# Patient Record
Sex: Female | Born: 1995 | Race: Black or African American | Hispanic: No | Marital: Single | Smoking: Never smoker
Health system: Southern US, Community
[De-identification: ages and names within clinical notes are randomized; demographics above are authoritative.]

## PROBLEM LIST (undated history)

## (undated) DIAGNOSIS — R519 Headache, unspecified: Secondary | ICD-10-CM

## (undated) DIAGNOSIS — F32A Depression, unspecified: Secondary | ICD-10-CM

## (undated) DIAGNOSIS — F419 Anxiety disorder, unspecified: Secondary | ICD-10-CM

## (undated) DIAGNOSIS — R7303 Prediabetes: Secondary | ICD-10-CM

## (undated) DIAGNOSIS — D649 Anemia, unspecified: Secondary | ICD-10-CM

## (undated) DIAGNOSIS — K219 Gastro-esophageal reflux disease without esophagitis: Secondary | ICD-10-CM

## (undated) DIAGNOSIS — N83209 Unspecified ovarian cyst, unspecified side: Secondary | ICD-10-CM

## (undated) DIAGNOSIS — Z8759 Personal history of other complications of pregnancy, childbirth and the puerperium: Secondary | ICD-10-CM

## (undated) HISTORY — PX: APPENDECTOMY: SHX54

---

## 2013-01-11 ENCOUNTER — Encounter (HOSPITAL_BASED_OUTPATIENT_CLINIC_OR_DEPARTMENT_OTHER): Payer: Self-pay | Admitting: Emergency Medicine

## 2013-01-11 DIAGNOSIS — Z8719 Personal history of other diseases of the digestive system: Secondary | ICD-10-CM | POA: Insufficient documentation

## 2013-01-11 DIAGNOSIS — Z3202 Encounter for pregnancy test, result negative: Secondary | ICD-10-CM | POA: Insufficient documentation

## 2013-01-11 DIAGNOSIS — N938 Other specified abnormal uterine and vaginal bleeding: Secondary | ICD-10-CM | POA: Insufficient documentation

## 2013-01-11 DIAGNOSIS — N949 Unspecified condition associated with female genital organs and menstrual cycle: Secondary | ICD-10-CM | POA: Insufficient documentation

## 2013-01-11 LAB — URINE MICROSCOPIC-ADD ON

## 2013-01-11 LAB — URINALYSIS, ROUTINE W REFLEX MICROSCOPIC
Nitrite: NEGATIVE
Protein, ur: NEGATIVE mg/dL
Urobilinogen, UA: 1 mg/dL (ref 0.0–1.0)

## 2013-01-11 LAB — PREGNANCY, URINE: Preg Test, Ur: NEGATIVE

## 2013-01-11 NOTE — ED Notes (Addendum)
C/o heavy vaginal period-2 period in Dec-c/o abd cramps-was seen at [facility] for r/o appy 2 weeks ago-rx vicodin-ran out-also rx abx she is still taking-rx phenergan po

## 2013-01-12 ENCOUNTER — Emergency Department (HOSPITAL_BASED_OUTPATIENT_CLINIC_OR_DEPARTMENT_OTHER)
Admission: EM | Admit: 2013-01-12 | Discharge: 2013-01-12 | Disposition: A | Discharge status: Home / Self Care | Payer: Medicaid Other | Attending: Emergency Medicine | Admitting: Emergency Medicine

## 2013-01-12 DIAGNOSIS — N939 Abnormal uterine and vaginal bleeding, unspecified: Secondary | ICD-10-CM

## 2013-01-12 HISTORY — DX: Unspecified ovarian cyst, unspecified side: N83.209

## 2013-01-12 HISTORY — DX: Gastro-esophageal reflux disease without esophagitis: K21.9

## 2013-01-12 LAB — GC/CHLAMYDIA PROBE AMP
CT Probe RNA: NEGATIVE
GC Probe RNA: NEGATIVE

## 2013-01-12 LAB — CBC WITH DIFFERENTIAL/PLATELET
Basophils Absolute: 0 10*3/uL (ref 0.0–0.1)
Eosinophils Relative: 3 % (ref 0–5)
HCT: 35.2 % — ABNORMAL LOW (ref 36.0–46.0)
Hemoglobin: 11.6 g/dL — ABNORMAL LOW (ref 12.0–15.0)
Lymphocytes Relative: 23 % (ref 12–46)
Lymphs Abs: 2.6 10*3/uL (ref 0.7–4.0)
MCV: 90.7 fL (ref 78.0–100.0)
Monocytes Absolute: 1 10*3/uL (ref 0.1–1.0)
Monocytes Relative: 9 % (ref 3–12)
Platelets: 331 10*3/uL (ref 150–400)
RDW: 13.5 % (ref 11.5–15.5)
WBC: 11.5 10*3/uL — ABNORMAL HIGH (ref 4.0–10.5)

## 2013-01-12 LAB — WET PREP, GENITAL

## 2013-01-12 NOTE — ED Notes (Signed)
Vag bleeding onset yesterday w abdpain lmp beginning december   l

## 2013-01-12 NOTE — ED Provider Notes (Signed)
Medical screening examination/treatment/procedure(s) were performed by non-physician practitioner and as supervising physician I was immediately available for consultation/collaboration.  EKG Interpretation   None         Dawnya Grams L Cyara Devoto, MD 01/12/13 0411 

## 2013-01-12 NOTE — ED Provider Notes (Signed)
CSN: [id number]     Arrival date & time 01/11/13  2113 History   First MD Initiated Contact with Patient 01/12/13 0006     Chief Complaint  Patient presents with  . Vaginal Bleeding   (Consider location/radiation/quality/duration/timing/severity/associated sxs/prior Treatment) Patient is a 17 y.o. female presenting with vaginal bleeding. The history is provided by the patient. No language interpreter was used.  Vaginal Bleeding Quality:  Bright red Severity:  Moderate Onset quality:  Sudden Duration:  1 day Timing:  Constant Progression:  Worsening Chronicity:  New Possible pregnancy: no   Relieved by:  Nothing Associated symptoms: no abdominal pain   Risk factors: no STD exposure   Pt reports she has been on antibiotic.   Pt reports she was diagnosed with an ovarian cyst early this month.  Pt had a ct scan at [place] to weeks ago.  Pt reports appendix was normal.  Pt reports she started period today.  Pt reports heavier than normal period.    Past Medical History  Diagnosis Date  . Ovarian cyst   . GERD (gastroesophageal reflux disease)    History reviewed. No pertinent past surgical history. No family history on file. History  Substance Use Topics  . Smoking status: Never Smoker   . Smokeless tobacco: Not on file  . Alcohol Use: No   OB History   Grav Para Term Preterm Abortions TAB SAB Ect Mult Living                 Review of Systems  Gastrointestinal: Negative for abdominal pain.  Genitourinary: Positive for vaginal bleeding.  All other systems reviewed and are negative.    Allergies  Review of patient's allergies indicates no known allergies.  Home Medications   Current Outpatient Rx  Name  Route  Sig  Dispense  Refill  . promethazine (PHENERGAN) 25 MG tablet   Oral   Take 25 mg by mouth every 6 (six) hours as needed for nausea or vomiting.         Marland Kitchen UNKNOWN TO PATIENT      abx          BP 118/57  Pulse 84  Temp(Src) 98.3 F (36.8 C)  (Oral)  Resp 16  Ht 5\' 1"  (1.549 m)  Wt 200 lb (90.719 kg)  BMI 37.81 kg/m2  SpO2 100%  LMP 01/10/2013 Physical Exam  Nursing note and vitals reviewed. Constitutional: She is oriented to person, place, and time. She appears well-developed and well-nourished.  HENT:  Head: Normocephalic.  Right Ear: External ear normal.  Left Ear: External ear normal.  Eyes: Conjunctivae and EOM are normal. Pupils are equal, round, and reactive to light.  Neck: Normal range of motion.  Cardiovascular: Normal rate, regular rhythm and normal heart sounds.   Pulmonary/Chest: Effort normal and breath sounds normal.  Abdominal: Soft. She exhibits no distension.  Genitourinary: Vagina normal and uterus normal.  Moderate bleeding no clots   Musculoskeletal: Normal range of motion.  Neurological: She is alert and oriented to person, place, and time. She has normal reflexes.  Skin: Skin is warm.  Psychiatric: She has a normal mood and affect.    ED Course  Procedures (including critical care time) Labs Review Labs Reviewed  URINALYSIS, ROUTINE W REFLEX MICROSCOPIC - Abnormal; Notable for the following:    Hgb urine dipstick LARGE (*)    Ketones, ur 15 (*)    All other components within normal limits  PREGNANCY, URINE  URINE MICROSCOPIC-ADD ON  Imaging Review No results found.  EKG Interpretation   None     hemoglobin 11.6  MDM   1. Abnormal vaginal bleeding        Elson Areas, [place] 01/12/13 [phone]

## 2013-02-11 ENCOUNTER — Emergency Department (HOSPITAL_BASED_OUTPATIENT_CLINIC_OR_DEPARTMENT_OTHER): Payer: Medicaid Other

## 2013-02-11 ENCOUNTER — Emergency Department (HOSPITAL_BASED_OUTPATIENT_CLINIC_OR_DEPARTMENT_OTHER)
Admission: EM | Admit: 2013-02-11 | Discharge: 2013-02-11 | Disposition: A | Discharge status: Home / Self Care | Payer: Medicaid Other | Attending: Emergency Medicine | Admitting: Emergency Medicine

## 2013-02-11 ENCOUNTER — Encounter (HOSPITAL_BASED_OUTPATIENT_CLINIC_OR_DEPARTMENT_OTHER): Payer: Self-pay | Admitting: Emergency Medicine

## 2013-02-11 DIAGNOSIS — Z791 Long term (current) use of non-steroidal anti-inflammatories (NSAID): Secondary | ICD-10-CM | POA: Insufficient documentation

## 2013-02-11 DIAGNOSIS — Z79899 Other long term (current) drug therapy: Secondary | ICD-10-CM | POA: Insufficient documentation

## 2013-02-11 DIAGNOSIS — Z3202 Encounter for pregnancy test, result negative: Secondary | ICD-10-CM | POA: Insufficient documentation

## 2013-02-11 DIAGNOSIS — N83209 Unspecified ovarian cyst, unspecified side: Secondary | ICD-10-CM | POA: Insufficient documentation

## 2013-02-11 DIAGNOSIS — A499 Bacterial infection, unspecified: Secondary | ICD-10-CM | POA: Insufficient documentation

## 2013-02-11 DIAGNOSIS — B9689 Other specified bacterial agents as the cause of diseases classified elsewhere: Secondary | ICD-10-CM | POA: Insufficient documentation

## 2013-02-11 DIAGNOSIS — N760 Acute vaginitis: Secondary | ICD-10-CM | POA: Insufficient documentation

## 2013-02-11 DIAGNOSIS — K219 Gastro-esophageal reflux disease without esophagitis: Secondary | ICD-10-CM | POA: Insufficient documentation

## 2013-02-11 LAB — COMPREHENSIVE METABOLIC PANEL
ALT: 10 U/L (ref 0–35)
AST: 14 U/L (ref 0–37)
Albumin: 3.5 g/dL (ref 3.5–5.2)
Alkaline Phosphatase: 74 U/L (ref 39–117)
BUN: 12 mg/dL (ref 6–23)
CO2: 25 mEq/L (ref 19–32)
Calcium: 9 mg/dL (ref 8.4–10.5)
Chloride: 103 mEq/L (ref 96–112)
Creatinine, Ser: 0.7 mg/dL (ref 0.50–1.10)
GFR calc Af Amer: >90 mL/min (ref >90)
GFR calc non Af Amer: >90 mL/min (ref >90)
Glucose, Bld: 98 mg/dL (ref 70–99)
Potassium: 4.1 mEq/L (ref 3.7–5.3)
Sodium: 139 mEq/L (ref 137–147)
Total Bilirubin: 0.3 mg/dL (ref 0.3–1.2)
Total Protein: 7.4 g/dL (ref 6.0–8.3)

## 2013-02-11 LAB — CBC WITH DIFFERENTIAL/PLATELET
Basophils Absolute: 0 10*3/uL (ref 0.0–0.1)
Basophils Relative: 0 % (ref 0–1)
Eosinophils Absolute: 0.3 10*3/uL (ref 0.0–0.7)
Eosinophils Relative: 3 % (ref 0–5)
HCT: 35.4 % — ABNORMAL LOW (ref 36.0–46.0)
Hemoglobin: 11.7 g/dL — ABNORMAL LOW (ref 12.0–15.0)
Lymphocytes Relative: 17 % (ref 12–46)
Lymphs Abs: 1.7 10*3/uL (ref 0.7–4.0)
MCH: 30 pg (ref 26.0–34.0)
MCHC: 33.1 g/dL (ref 30.0–36.0)
MCV: 90.8 fL (ref 78.0–100.0)
Monocytes Absolute: 0.8 10*3/uL (ref 0.1–1.0)
Monocytes Relative: 8 % (ref 3–12)
Neutro Abs: 7.4 10*3/uL (ref 1.7–7.7)
Neutrophils Relative %: 72 % (ref 43–77)
Platelets: 308 10*3/uL (ref 150–400)
RBC: 3.9 MIL/uL (ref 3.87–5.11)
RDW: 13.7 % (ref 11.5–15.5)
WBC: 10.3 10*3/uL (ref 4.0–10.5)

## 2013-02-11 LAB — PREGNANCY, URINE: Preg Test, Ur: NEGATIVE

## 2013-02-11 LAB — URINALYSIS, ROUTINE W REFLEX MICROSCOPIC
Bilirubin Urine: NEGATIVE
GLUCOSE, UA: NEGATIVE mg/dL
HGB URINE DIPSTICK: NEGATIVE
Ketones, ur: NEGATIVE mg/dL
Leukocytes, UA: NEGATIVE
Nitrite: NEGATIVE
Protein, ur: NEGATIVE mg/dL
SPECIFIC GRAVITY, URINE: 1.025 (ref 1.005–1.030)
Urobilinogen, UA: 0.2 mg/dL (ref 0.0–1.0)
pH: 7 (ref 5.0–8.0)

## 2013-02-11 LAB — WET PREP, GENITAL
Trich, Wet Prep: NONE SEEN
Yeast Wet Prep HPF POC: NONE SEEN

## 2013-02-11 LAB — LIPASE, BLOOD: Lipase: 17 U/L (ref 11–59)

## 2013-02-11 MED ORDER — IBUPROFEN 800 MG PO TABS: 800.0000 mg | ORAL_TABLET | Freq: Three times a day (TID) | ORAL | Status: DC

## 2013-02-11 MED ORDER — METRONIDAZOLE 500 MG PO TABS: 500.0000 mg | ORAL_TABLET | Freq: Two times a day (BID) | ORAL | Status: DC

## 2013-02-11 MED ORDER — IOHEXOL 300 MG/ML  SOLN
50.0000 mL | Freq: Once | INTRAMUSCULAR | Status: AC | PRN
  Administered 2013-02-11: 50 mL via ORAL

## 2013-02-11 MED ORDER — ONDANSETRON 4 MG PO TBDP
4.0000 mg | ORAL_TABLET | Freq: Once | ORAL | Status: AC
  Administered 2013-02-11: 4 mg via ORAL
  Filled 2013-02-11: qty 1

## 2013-02-11 MED ORDER — IOHEXOL 300 MG/ML  SOLN
100.0000 mL | Freq: Once | INTRAMUSCULAR | Status: AC | PRN
  Administered 2013-02-11: 100 mL via INTRAVENOUS

## 2013-02-11 MED ORDER — HYDROCODONE-ACETAMINOPHEN 5-325 MG PO TABS: 2.0000 | ORAL_TABLET | ORAL | Status: DC | PRN

## 2013-02-11 NOTE — Discharge Instructions (Signed)

## 2013-02-11 NOTE — ED Notes (Signed)
Patient transported to CT 

## 2013-02-11 NOTE — ED Provider Notes (Signed)
CSN: [id number]     Arrival date & time 02/11/13  0906 History   First MD Initiated Contact with Patient 02/11/13 [phone]     Chief Complaint  Patient presents with  . Nausea  . Diarrhea   (Consider location/radiation/quality/duration/timing/severity/associated sxs/prior Treatment) HPI Comments: Nausea and crampy lower abdominal pain for the past 2 days. Associated with loose episodes of diarrhea. Denies any vomiting. Denies any fever. Has been evaluated for similar pain in the past with CT scan and ultrasound and told it was not her gallbladder. Denies any fevers. Denies any dysuria hematuria. Denies any vaginal bleeding or discharge.  The history is provided by the patient.    Past Medical History  Diagnosis Date  . Ovarian cyst   . GERD (gastroesophageal reflux disease)    History reviewed. No pertinent past surgical history. No family history on file. History  Substance Use Topics  . Smoking status: Never Smoker   . Smokeless tobacco: Not on file  . Alcohol Use: No   OB History   Grav Para Term Preterm Abortions TAB SAB Ect Mult Living                 Review of Systems  Constitutional: Negative for fever, activity change and appetite change.  HENT: Negative for congestion and rhinorrhea.   Respiratory: Negative for cough, chest tightness and shortness of breath.   Gastrointestinal: Positive for nausea, abdominal pain and diarrhea. Negative for vomiting.  Genitourinary: Negative for dysuria, hematuria, vaginal bleeding and vaginal discharge.  Musculoskeletal: Positive for arthralgias and myalgias.  Skin: Negative for rash.  Neurological: Negative for dizziness, weakness and headaches.  A complete 10 system review of systems was obtained and all systems are negative except as noted in the HPI and PMH.    Allergies  Review of patient's allergies indicates no known allergies.  Home Medications   Current Outpatient Rx  Name  Route  Sig  Dispense  Refill  . omeprazole  (PRILOSEC) 10 MG capsule   Oral   Take 10 mg by mouth daily.         Marland Kitchen HYDROcodone-acetaminophen (NORCO/VICODIN) 5-325 MG per tablet   Oral   Take 2 tablets by mouth every 4 (four) hours as needed.   10 tablet   0   . ibuprofen (ADVIL,MOTRIN) 800 MG tablet   Oral   Take 1 tablet (800 mg total) by mouth 3 (three) times daily.   21 tablet   0   . metroNIDAZOLE (FLAGYL) 500 MG tablet   Oral   Take 1 tablet (500 mg total) by mouth 2 (two) times daily.   14 tablet   0   . promethazine (PHENERGAN) 25 MG tablet   Oral   Take 25 mg by mouth every 6 (six) hours as needed for nausea or vomiting.         Marland Kitchen UNKNOWN TO PATIENT      abx          BP 105/60  Pulse 68  Temp(Src) 98.5 F (36.9 C) (Oral)  Resp 18  Ht 5' (1.524 m)  Wt 200 lb (90.719 kg)  BMI 39.06 kg/m2  SpO2 100%  LMP 01/14/2013 Physical Exam  Constitutional: She is oriented to person, place, and time. She appears well-developed and well-nourished. No distress.  HENT:  Head: Normocephalic and atraumatic.  Mouth/Throat: Oropharynx is clear and moist. No oropharyngeal exudate.  Eyes: Conjunctivae and EOM are normal. Pupils are equal, round, and reactive to light.  Neck: Normal  range of motion. Neck supple.  Cardiovascular: Normal rate, regular rhythm and normal heart sounds.   Pulmonary/Chest: Effort normal and breath sounds normal. No respiratory distress.  Abdominal: Soft. There is tenderness. There is no rebound and no guarding.  Mild R sided tenderness.  Negative Murphy's sign  Genitourinary:  Normal external genitalia.  No CMT.  R adnexal fullness  Musculoskeletal: Normal range of motion. She exhibits no edema and no tenderness.  No CVAT  Neurological: She is alert and oriented to person, place, and time. No cranial nerve deficit. She exhibits normal muscle tone. Coordination normal.  Skin: Skin is warm.    ED Course  Procedures (including critical care time) Labs Review Labs Reviewed  WET PREP,  GENITAL - Abnormal; Notable for the following:    Clue Cells Wet Prep HPF POC FEW (*)    WBC, Wet Prep HPF POC FEW (*)    All other components within normal limits  URINALYSIS, ROUTINE W REFLEX MICROSCOPIC - Abnormal; Notable for the following:    APPearance CLOUDY (*)    All other components within normal limits  CBC WITH DIFFERENTIAL - Abnormal; Notable for the following:    Hemoglobin 11.7 (*)    HCT 35.4 (*)    All other components within normal limits  GC/CHLAMYDIA PROBE AMP  PREGNANCY, URINE  COMPREHENSIVE METABOLIC PANEL  LIPASE, BLOOD   Imaging Review US Transvaginal Non-ob  02/11/2013   CLINICAL DATA:  Right lower quadrant pain  EXAM: TRANSABDOMINAL AND TRANSVAGINAL ULTRASOUND OF PELVIS  DOPPLER ULTRASOUND OF OVARIES  TECHNIQUE: Both transabdominal and transvaginal ultrasound examinations of the pelvis were performed. Transabdominal technique was performed for global imaging of the pelvis including uterus, ovaries, adnexal regions, and pelvic cul-de-sac.  It was necessary to proceed with endovaginal exam following the transabdominal exam to visualize the ovaries and endometrium. Color and duplex Doppler ultrasound was utilized to evaluate blood flow to the ovaries.  COMPARISON:  CT ABD/PELVIS W CM dated 02/11/2013  FINDINGS: Uterus  Measurements: 7.2 x 3.7 x 5.1 cm. No fibroids or other mass visualized.  Endometrium  Thickness: 4 mm.  No focal abnormality visualized.  Right ovary  Measurements: 4.7 x 3.9 x 4.3 cm. There is a 4.2 x 3.1 x 3.4 cm anechoic right ovarian mass with increased through transmission most consistent with cyst.  Left ovary  Measurements: 2.9 x 1.4 x 2.2 cm. Normal appearance/no adnexal mass.  Pulsed Doppler evaluation of both ovaries demonstrates normal low-resistance arterial and venous waveforms.  Other findings  No free fluid.  IMPRESSION: 1. No active ovarian torsion. 2. There is a 4.2 x 3.1 x 3.4 cm right ovarian cyst. This is almost certainly benign, and no  specific imaging follow up is recommended according to the Society of Radiologists in Ultrasound 2010 Consensus Conference Statement (D Lenis Noon et al. Management of Asymptomatic Ovarian and Other Adnexal Cysts Imaged at [place]: Society of Radiologists in Ultrasound Consensus Conference Statement 2010. Radiology 256 (Sept 2010): 943-954.).   Electronically Signed   By: Elige Ko   On: 02/11/2013 14:06   US Pelvis Complete  02/11/2013   CLINICAL DATA:  Right lower quadrant pain  EXAM: TRANSABDOMINAL AND TRANSVAGINAL ULTRASOUND OF PELVIS  DOPPLER ULTRASOUND OF OVARIES  TECHNIQUE: Both transabdominal and transvaginal ultrasound examinations of the pelvis were performed. Transabdominal technique was performed for global imaging of the pelvis including uterus, ovaries, adnexal regions, and pelvic cul-de-sac.  It was necessary to proceed with endovaginal exam following the transabdominal exam to visualize the  ovaries and endometrium. Color and duplex Doppler ultrasound was utilized to evaluate blood flow to the ovaries.  COMPARISON:  CT ABD/PELVIS W CM dated 02/11/2013  FINDINGS: Uterus  Measurements: 7.2 x 3.7 x 5.1 cm. No fibroids or other mass visualized.  Endometrium  Thickness: 4 mm.  No focal abnormality visualized.  Right ovary  Measurements: 4.7 x 3.9 x 4.3 cm. There is a 4.2 x 3.1 x 3.4 cm anechoic right ovarian mass with increased through transmission most consistent with cyst.  Left ovary  Measurements: 2.9 x 1.4 x 2.2 cm. Normal appearance/no adnexal mass.  Pulsed Doppler evaluation of both ovaries demonstrates normal low-resistance arterial and venous waveforms.  Other findings  No free fluid.  IMPRESSION: 1. No active ovarian torsion. 2. There is a 4.2 x 3.1 x 3.4 cm right ovarian cyst. This is almost certainly benign, and no specific imaging follow up is recommended according to the Society of Radiologists in Ultrasound 2010 Consensus Conference Statement (D Lenis NoonLevine et al. Management of Asymptomatic Ovarian  and Other Adnexal Cysts Imaged at [place]: Society of Radiologists in Ultrasound Consensus Conference Statement 2010. Radiology 256 (Sept 2010): 943-954.).   Electronically Signed   By: Elige KoHetal  Patel   On: 02/11/2013 14:06   Ct Abdomen Pelvis W Contrast  02/11/2013   CLINICAL DATA:  Right-sided abdominal discomfort with nausea and vomiting and diarrhea for 3 days  EXAM: CT ABDOMEN AND PELVIS WITH CONTRAST  TECHNIQUE: Multidetector CT imaging of the abdomen and pelvis was performed using the standard protocol following bolus administration of intravenous contrast.  CONTRAST:  100mL OMNIPAQUE IOHEXOL 300 MG/ML SOLN intravenously, the patient also received oral contrast material.  COMPARISON:  CT ABD-PELV W/ CM dated 01/01/2013  FINDINGS: Since the previous study there has developed a prominent right adnexal cystic appearing structure measuring 3.3 x 4.6 cm with HU measurement of +25. A normal appearing appendix lies adjacent to this on images 58 through 69. The uterus is normal in density and contour. The previously demonstrated cystic left adnexal structure has resolved. There is no free fluid in the pelvis.  The liver, gallbladder, pancreas, spleen, partially distended stomach, adrenal glands, and kidneys are normal in appearance. The caliber of the abdominal aorta is normal. There is no periaortic or pericaval lymphadenopathy. The psoas musculature is normal in appearance. Contrast within the small bowel appears normal. It has not reached the colon. There is a moderate stool burden noted throughout the colon and rectum.  The lung bases are clear. The lumbar vertebral bodies are preserved in height. The bony pelvis exhibits no acute abnormality.  IMPRESSION: 1. Since the previous study there has developed a cystic-appearing right adnexal process. This may be responsible for the patient's symptoms. This structure could be evaluated further with pelvic ultrasound in 10 days to 2 weeks to judge whether it might resolve  with progression of the patient's menstrual cycle. The previously described left adnexal process has resolved. 2. There is no evidence of acute appendicitis nor other acute bowel abnormality. A moderate amount of stool is noted throughout the colon. 3. There is no acute hepatobiliary abnormality nor acute urinary tract abnormality.   Electronically Signed   By: David  Swaz[place]   On: 02/11/2013 12:41   [place] Art/ven Flow Abd Pelv Doppler  02/11/2013   CLINICAL DATA:  Right lower quadrant pain  EXAM: TRANSABDOMINAL AND TRANSVAGINAL ULTRASOUND OF PELVIS  DOPPLER ULTRASOUND OF OVARIES  TECHNIQUE: Both transabdominal and transvaginal ultrasound examinations of the pelvis were performed. Transabdominal technique  was performed for global imaging of the pelvis including uterus, ovaries, adnexal regions, and pelvic cul-de-sac.  It was necessary to proceed with endovaginal exam following the transabdominal exam to visualize the ovaries and endometrium. Color and duplex Doppler ultrasound was utilized to evaluate blood flow to the ovaries.  COMPARISON:  CT ABD/PELVIS W CM dated 02/11/2013  FINDINGS: Uterus  Measurements: 7.2 x 3.7 x 5.1 cm. No fibroids or other mass visualized.  Endometrium  Thickness: 4 mm.  No focal abnormality visualized.  Right ovary  Measurements: 4.7 x 3.9 x 4.3 cm. There is a 4.2 x 3.1 x 3.4 cm anechoic right ovarian mass with increased through transmission most consistent with cyst.  Left ovary  Measurements: 2.9 x 1.4 x 2.2 cm. Normal appearance/no adnexal mass.  Pulsed Doppler evaluation of both ovaries demonstrates normal low-resistance arterial and venous waveforms.  Other findings  No free fluid.  IMPRESSION: 1. No active ovarian torsion. 2. There is a 4.2 x 3.1 x 3.4 cm right ovarian cyst. This is almost certainly benign, and no specific imaging follow up is recommended according to the Society of Radiologists in Ultrasound 2010 Consensus Conference Statement (D Lenis Noon et al. Management of  Asymptomatic Ovarian and Other Adnexal Cysts Imaged at [place]: Society of Radiologists in Ultrasound Consensus Conference Statement 2010. Radiology 256 (Sept 2010): 943-954.).   Electronically Signed   By: Elige Ko   On: 02/11/2013 14:06    EKG Interpretation   None       MDM   1. Ovarian cyst   2. Bacterial vaginosis    R sided abdominal pain with nausea and diarrhea. She was told she had an ovarian cyst on the left. She now has pain in the right. She does still have an appendix.  Labs are unremarkable. Urinalysis is negative.  Records obtained from [facility]. CT scan on December 19 showed normal appendix with large left adnexal lesion thought to be ovarian cysts. ultrasound of the abdomen showed a normal gallbladder.  Imaging today shows large right-sided adnexal cyst. Appendix appears normal. No evidence of ovarian torsion. Patient's pain will be treated with anti-inflammatories. We'll treat bacterial vaginosis. Follow up with gynecology  Glynn Octave, MD 02/11/13 [phone]

## 2013-02-11 NOTE — ED Notes (Signed)
Returned from ct scan 

## 2013-02-11 NOTE — ED Notes (Signed)
Patient states she has had nausea and diarrhea for the last two days.  States she does have LRQ abdominal pain and was referred to an OBGYN for further evaluation.

## 2013-02-11 NOTE — ED Notes (Signed)
Pelvic cart at bedside. 

## 2013-02-11 NOTE — ED Notes (Signed)
Drinking contrast for ct 

## 2013-02-12 LAB — GC/CHLAMYDIA PROBE AMP
CT Probe RNA: NEGATIVE
GC Probe RNA: NEGATIVE

## 2013-05-17 ENCOUNTER — Emergency Department (HOSPITAL_BASED_OUTPATIENT_CLINIC_OR_DEPARTMENT_OTHER): Payer: Medicaid Other

## 2013-05-17 ENCOUNTER — Emergency Department (HOSPITAL_BASED_OUTPATIENT_CLINIC_OR_DEPARTMENT_OTHER)
Admission: EM | Admit: 2013-05-17 | Discharge: 2013-05-17 | Disposition: A | Discharge status: Home / Self Care | Payer: Medicaid Other | Attending: Emergency Medicine | Admitting: Emergency Medicine

## 2013-05-17 ENCOUNTER — Encounter (HOSPITAL_BASED_OUTPATIENT_CLINIC_OR_DEPARTMENT_OTHER): Payer: Self-pay | Admitting: Emergency Medicine

## 2013-05-17 DIAGNOSIS — R109 Unspecified abdominal pain: Secondary | ICD-10-CM

## 2013-05-17 DIAGNOSIS — K219 Gastro-esophageal reflux disease without esophagitis: Secondary | ICD-10-CM | POA: Insufficient documentation

## 2013-05-17 DIAGNOSIS — Z792 Long term (current) use of antibiotics: Secondary | ICD-10-CM | POA: Insufficient documentation

## 2013-05-17 DIAGNOSIS — Z8742 Personal history of other diseases of the female genital tract: Secondary | ICD-10-CM | POA: Insufficient documentation

## 2013-05-17 DIAGNOSIS — Z79899 Other long term (current) drug therapy: Secondary | ICD-10-CM | POA: Insufficient documentation

## 2013-05-17 DIAGNOSIS — Z3202 Encounter for pregnancy test, result negative: Secondary | ICD-10-CM | POA: Insufficient documentation

## 2013-05-17 DIAGNOSIS — R51 Headache: Secondary | ICD-10-CM | POA: Insufficient documentation

## 2013-05-17 DIAGNOSIS — R1032 Left lower quadrant pain: Secondary | ICD-10-CM | POA: Insufficient documentation

## 2013-05-17 DIAGNOSIS — Z791 Long term (current) use of non-steroidal anti-inflammatories (NSAID): Secondary | ICD-10-CM | POA: Insufficient documentation

## 2013-05-17 LAB — URINALYSIS, ROUTINE W REFLEX MICROSCOPIC
Bilirubin Urine: NEGATIVE
Glucose, UA: NEGATIVE mg/dL
Hgb urine dipstick: NEGATIVE
KETONES UR: NEGATIVE mg/dL
Leukocytes, UA: NEGATIVE
NITRITE: NEGATIVE
PH: 6 (ref 5.0–8.0)
Protein, ur: NEGATIVE mg/dL
SPECIFIC GRAVITY, URINE: 1.022 (ref 1.005–1.030)
Urobilinogen, UA: 1 mg/dL (ref 0.0–1.0)

## 2013-05-17 LAB — CBC WITH DIFFERENTIAL/PLATELET
BASOS PCT: 0 % (ref 0–1)
Basophils Absolute: 0 10*3/uL (ref 0.0–0.1)
EOS ABS: 0.6 10*3/uL (ref 0.0–0.7)
Eosinophils Relative: 5 % (ref 0–5)
HCT: 35.5 % — ABNORMAL LOW (ref 36.0–46.0)
Hemoglobin: 11.9 g/dL — ABNORMAL LOW (ref 12.0–15.0)
LYMPHS ABS: 2.5 10*3/uL (ref 0.7–4.0)
Lymphocytes Relative: 20 % (ref 12–46)
MCH: 30.6 pg (ref 26.0–34.0)
MCHC: 33.5 g/dL (ref 30.0–36.0)
MCV: 91.3 fL (ref 78.0–100.0)
Monocytes Absolute: 1 10*3/uL (ref 0.1–1.0)
Monocytes Relative: 8 % (ref 3–12)
NEUTROS PCT: 67 % (ref 43–77)
Neutro Abs: 8.3 10*3/uL — ABNORMAL HIGH (ref 1.7–7.7)
PLATELETS: 339 10*3/uL (ref 150–400)
RBC: 3.89 MIL/uL (ref 3.87–5.11)
RDW: 13.5 % (ref 11.5–15.5)
WBC: 12.5 10*3/uL — ABNORMAL HIGH (ref 4.0–10.5)

## 2013-05-17 LAB — BASIC METABOLIC PANEL
BUN: 6 mg/dL (ref 6–23)
CO2: 25 mEq/L (ref 19–32)
Calcium: 9.6 mg/dL (ref 8.4–10.5)
Chloride: 102 mEq/L (ref 96–112)
Creatinine, Ser: 0.7 mg/dL (ref 0.50–1.10)
GLUCOSE: 100 mg/dL — AB (ref 70–99)
POTASSIUM: 3.5 meq/L — AB (ref 3.7–5.3)
Sodium: 140 mEq/L (ref 137–147)

## 2013-05-17 LAB — PREGNANCY, URINE: Preg Test, Ur: NEGATIVE

## 2013-05-17 MED ORDER — SODIUM CHLORIDE 0.9 % IV BOLUS (SEPSIS)
250.0000 mL | Freq: Once | INTRAVENOUS | Status: AC
Start: 2013-05-17 — End: 2013-05-17
  Administered 2013-05-17: 250 mL via INTRAVENOUS

## 2013-05-17 MED ORDER — HYDROCODONE-ACETAMINOPHEN 5-325 MG PO TABS: 1.0000 | ORAL_TABLET | Freq: Four times a day (QID) | ORAL | Status: DC | PRN

## 2013-05-17 MED ORDER — HYDROMORPHONE HCL PF 1 MG/ML IJ SOLN
1.0000 mg | Freq: Once | INTRAMUSCULAR | Status: AC
  Administered 2013-05-17: 1 mg via INTRAVENOUS
  Filled 2013-05-17: qty 1

## 2013-05-17 MED ORDER — IOHEXOL 300 MG/ML  SOLN
50.0000 mL | Freq: Once | INTRAMUSCULAR | Status: AC | PRN
  Administered 2013-05-17: 50 mL via ORAL

## 2013-05-17 MED ORDER — ONDANSETRON HCL 4 MG/2ML IJ SOLN
4.0000 mg | Freq: Once | INTRAMUSCULAR | Status: AC
  Administered 2013-05-17: 4 mg via INTRAVENOUS
  Filled 2013-05-17: qty 2

## 2013-05-17 MED ORDER — IOHEXOL 300 MG/ML  SOLN
100.0000 mL | Freq: Once | INTRAMUSCULAR | Status: AC | PRN
  Administered 2013-05-17: 100 mL via INTRAVENOUS

## 2013-05-17 MED ORDER — SODIUM CHLORIDE 0.9 % IV SOLN
INTRAVENOUS | Status: DC
  Administered 2013-05-17: 23:00:00 via INTRAVENOUS

## 2013-05-17 MED ORDER — PROMETHAZINE HCL 25 MG PO TABS: 25.0000 mg | ORAL_TABLET | Freq: Four times a day (QID) | ORAL | Status: DC | PRN

## 2013-05-17 NOTE — ED Notes (Signed)
Pt reports that she developed lower left abdominal pain that is worse with ambulation last pm, the pain has persisted since last pm and pain shifts towards the middle. Denies vomiting or diarrhea, reports some nausea, last bm 5/3

## 2013-05-17 NOTE — ED Provider Notes (Signed)
CSN: [id number]     Arrival date & time 05/17/13  1908 History  This chart was scribed for Shelda Jakes, MD by Ardelia Mems, ED Scribe. This patient was seen in room MH07/MH07 and the patient's care was started at 9:43 PM.    Chief Complaint  Patient presents with  . Abdominal Pain    Patient is a 18 y.o. female presenting with abdominal pain. The history is provided by the patient. No language interpreter was used.  Abdominal Pain Pain location:  LLQ Pain quality: sharp   Pain radiates to:  Back Pain severity:  Moderate ("7/10" currently) Onset quality:  Gradual Duration:  24 hours Timing:  Constant Progression:  Waxing and waning Chronicity:  New Relieved by:  None tried Worsened by:  Movement Ineffective treatments:  None tried Associated symptoms: nausea and sore throat   Associated symptoms: no chest pain, no cough, no dysuria, no fever, no shortness of breath, no vaginal bleeding and no vomiting     HPI Comments: Miranda Mack is a 18 y.o. Female with a history of GERD and ovarian cyst who presents to the Emergency Department complaining of constant, moderate LLQ abdominal pain onset last night, around 8 or 9 PM. She describes her pain as "sharp" and rates her pain at "7/10" currently. She states that her pain occasionally radiates to her back. She reports associated nausea, but denies emesis. She also states that she has been having a headache and light headedness over the past few days, as well as a sore throat today. She states that her pain is worsened with walking. She states that she has a history of similar pain, which she was seen for in January 2015. At that visit, she was diagnosed with an ovarian cyst. She denies dysuria or any other symptoms. She states that her menstrual cycle is not regular, and that her LMP ended on 05/08/12.   Past Medical History  Diagnosis Date  . Ovarian cyst   . GERD (gastroesophageal reflux disease)    History reviewed. No pertinent  past surgical history. History reviewed. No pertinent family history. History  Substance Use Topics  . Smoking status: Never Smoker   . Smokeless tobacco: Not on file  . Alcohol Use: No   OB History   Grav Para Term Preterm Abortions TAB SAB Ect Mult Living                 Review of Systems  Constitutional: Negative for fever.  HENT: Positive for sore throat. Negative for rhinorrhea.   Eyes: Negative for visual disturbance.  Respiratory: Negative for cough, shortness of breath and wheezing.   Cardiovascular: Negative for chest pain and leg swelling.  Gastrointestinal: Positive for nausea and abdominal pain. Negative for vomiting.  Genitourinary: Negative for dysuria and vaginal bleeding.  Musculoskeletal: Positive for back pain.  Skin: Negative for rash.  Neurological: Positive for light-headedness and headaches.  Hematological: Does not bruise/bleed easily.  Psychiatric/Behavioral: Negative for confusion.    Allergies  Review of patient's allergies indicates no known allergies.  Home Medications   Prior to Admission medications   Medication Sig Start Date End Date Taking? Authorizing Provider  traMADol (ULTRAM) 50 MG tablet Take by mouth every 6 (six) hours as needed.   Yes Historical Provider, MD  HYDROcodone-acetaminophen (NORCO/VICODIN) 5-325 MG per tablet Take 2 tablets by mouth every 4 (four) hours as needed. 02/11/13   Glynn Octave, MD  ibuprofen (ADVIL,MOTRIN) 800 MG tablet Take 1 tablet (800 mg total) by  mouth 3 (three) times daily. 02/11/13   Glynn OctaveStephen Rancour, MD  metroNIDAZOLE (FLAGYL) 500 MG tablet Take 1 tablet (500 mg total) by mouth 2 (two) times daily. 02/11/13   Glynn OctaveStephen Rancour, MD  omeprazole (PRILOSEC) 10 MG capsule Take 10 mg by mouth daily.    Historical Provider, MD  promethazine (PHENERGAN) 25 MG tablet Take 25 mg by mouth every 6 (six) hours as needed for nausea or vomiting.    Historical Provider, MD  UNKNOWN TO PATIENT abx    Historical Provider, MD    Triage Vitals: BP 131/77  Pulse 70  Temp(Src) 98.3 F (36.8 C) (Oral)  Resp 22  Ht 5\' 1"  (1.549 m)  Wt 220 lb (99.791 kg)  BMI 41.59 kg/m2  SpO2 99%  LMP 05/09/2013  Physical Exam  Nursing note and vitals reviewed. Constitutional: She is oriented to person, place, and time. She appears well-developed and well-nourished. No distress.  HENT:  Head: Normocephalic and atraumatic.  Eyes: EOM are normal.  Neck: Neck supple. No tracheal deviation present.  Cardiovascular: Normal rate and regular rhythm.   Pulmonary/Chest: Effort normal and breath sounds normal. No respiratory distress. She has no wheezes. She has no rales.  Lungs CTA  Abdominal: Bowel sounds are normal. There is tenderness (LLQ).  Musculoskeletal: Normal range of motion. She exhibits no edema.  No swelling in the ankles  Neurological: She is alert and oriented to person, place, and time.  Skin: Skin is warm and dry.  Psychiatric: She has a normal mood and affect. Her behavior is normal.    ED Course  Procedures (including critical care time)  DIAGNOSTIC STUDIES: Oxygen Saturation is 99% on RA, normal by my interpretation.    COORDINATION OF CARE: 9:48 PM- Discussed plan to obtain UA and a urine pregnancy test. Pt advised of plan for treatment and pt agrees.  Labs Review Labs Reviewed  CBC WITH DIFFERENTIAL - Abnormal; Notable for the following:    WBC 12.5 (*)    Hemoglobin 11.9 (*)    HCT 35.5 (*)    Neutro Abs 8.3 (*)    All other components within normal limits  BASIC METABOLIC PANEL - Abnormal; Notable for the following:    Potassium 3.5 (*)    Glucose, Bld 100 (*)    All other components within normal limits  URINALYSIS, ROUTINE W REFLEX MICROSCOPIC  PREGNANCY, URINE   Results for orders placed during the hospital encounter of 05/17/13  URINALYSIS, ROUTINE W REFLEX MICROSCOPIC      Result Value Ref Range   Color, Urine YELLOW  YELLOW   APPearance CLEAR  CLEAR   Specific Gravity, Urine 1.022   1.005 - 1.030   pH 6.0  5.0 - 8.0   Glucose, UA NEGATIVE  NEGATIVE mg/dL   Hgb urine dipstick NEGATIVE  NEGATIVE   Bilirubin Urine NEGATIVE  NEGATIVE   Ketones, ur NEGATIVE  NEGATIVE mg/dL   Protein, ur NEGATIVE  NEGATIVE mg/dL   Urobilinogen, UA 1.0  0.0 - 1.0 mg/dL   Nitrite NEGATIVE  NEGATIVE   Leukocytes, UA NEGATIVE  NEGATIVE  PREGNANCY, URINE      Result Value Ref Range   Preg Test, Ur NEGATIVE  NEGATIVE  CBC WITH DIFFERENTIAL      Result Value Ref Range   WBC 12.5 (*) 4.0 - 10.5 K/uL   RBC 3.89  3.87 - 5.11 MIL/uL   Hemoglobin 11.9 (*) 12.0 - 15.0 g/dL   HCT 16.135.5 (*) 09.636.0 - 04.546.0 %   MCV  91.3  78.0 - 100.0 fL   MCH 30.6  26.0 - 34.0 pg   MCHC 33.5  30.0 - 36.0 g/dL   RDW 40.913.5  81.111.5 - 91.415.5 %   Platelets 339  150 - 400 K/uL   Neutrophils Relative % 67  43 - 77 %   Neutro Abs 8.3 (*) 1.7 - 7.7 K/uL   Lymphocytes Relative 20  12 - 46 %   Lymphs Abs 2.5  0.7 - 4.0 K/uL   Monocytes Relative 8  3 - 12 %   Monocytes Absolute 1.0  0.1 - 1.0 K/uL   Eosinophils Relative 5  0 - 5 %   Eosinophils Absolute 0.6  0.0 - 0.7 K/uL   Basophils Relative 0  0 - 1 %   Basophils Absolute 0.0  0.0 - 0.1 K/uL  BASIC METABOLIC PANEL      Result Value Ref Range   Sodium 140  137 - 147 mEq/L   Potassium 3.5 (*) 3.7 - 5.3 mEq/L   Chloride 102  96 - 112 mEq/L   CO2 25  19 - 32 mEq/L   Glucose, Bld 100 (*) 70 - 99 mg/dL   BUN 6  6 - 23 mg/dL   Creatinine, Ser 7.820.70  0.50 - 1.10 mg/dL   Calcium 9.6  8.4 - 95.610.5 mg/dL   GFR calc non Af Amer >90  >90 mL/min   GFR calc Af Amer >90  >90 mL/min     Imaging Review Ct Abdomen Pelvis W Contrast  05/17/2013   CLINICAL DATA:  Left pelvic pain  EXAM: CT ABDOMEN AND PELVIS WITH CONTRAST  TECHNIQUE: Multidetector CT imaging of the abdomen and pelvis was performed using the standard protocol following bolus administration of intravenous contrast.  CONTRAST:  50mL OMNIPAQUE IOHEXOL 300 MG/ML SOLN, 100mL OMNIPAQUE IOHEXOL 300 MG/ML SOLN  COMPARISON:  None.   FINDINGS: The lung bases are clear.  The liver demonstrates no focal abnormality. There is no intrahepatic or extrahepatic biliary ductal dilatation. The gallbladder is normal. The spleen demonstrates no focal abnormality. The kidneys, adrenal glands and pancreas are normal. The bladder is unremarkable.  The stomach, duodenum, small intestine, and large intestine demonstrate no contrast extravasation or dilatation. There is a normal caliber appendix in the right lower quadrant without periappendiceal inflammatory changes.The uterus and ovaries are normal. There is no pneumoperitoneum, pneumatosis, or portal venous gas. There is no abdominal or pelvic free fluid. There is no lymphadenopathy.  The abdominal aorta is normal in caliber.  There are no lytic or sclerotic osseous lesions.  IMPRESSION: No acute abdominal or pelvic pathology.   Electronically Signed   By: Elige KoHetal  Patel   On: 05/17/2013 23:23     EKG Interpretation None      MDM   Final diagnoses:  Abdominal pain    For outpatient workup for the abdominal pain on the left lower quadrant this time without evidence of the variances. Sac cause of the pain is not clear. Mild leukocytosis but no evidence urinary tract infectionelectrolyte abnormalities. CT scan was completely normal. Pregnancy test was negative. She pain medicine and antinausea medicine. Patient will followup for any new or worse symptoms or things do not resolve.  I personally performed the services described in this documentation, which was scribed in my presence. The recorded information has been reviewed and is accurate.    Shelda JakesScott W. Ginamarie Banfield, MD 05/17/13 [phone]

## 2013-05-17 NOTE — Discharge Instructions (Signed)
Workup for the abdominal pain this time was negative. CT scan without any significant findings. Urinalysis was normal not consistently urinary tract infection. Take pain medicine and antinausea medicine as directed. Return for any new or worse symptoms.

## 2014-09-17 ENCOUNTER — Encounter (HOSPITAL_BASED_OUTPATIENT_CLINIC_OR_DEPARTMENT_OTHER): Payer: Self-pay | Admitting: Emergency Medicine

## 2014-09-17 ENCOUNTER — Emergency Department (HOSPITAL_BASED_OUTPATIENT_CLINIC_OR_DEPARTMENT_OTHER)
Admission: EM | Admit: 2014-09-17 | Discharge: 2014-09-17 | Disposition: A | Discharge status: Home / Self Care | Payer: Medicaid Other | Attending: Emergency Medicine | Admitting: Emergency Medicine

## 2014-09-17 DIAGNOSIS — K219 Gastro-esophageal reflux disease without esophagitis: Secondary | ICD-10-CM | POA: Insufficient documentation

## 2014-09-17 DIAGNOSIS — N760 Acute vaginitis: Secondary | ICD-10-CM | POA: Insufficient documentation

## 2014-09-17 DIAGNOSIS — R102 Pelvic and perineal pain: Secondary | ICD-10-CM

## 2014-09-17 DIAGNOSIS — R110 Nausea: Secondary | ICD-10-CM | POA: Insufficient documentation

## 2014-09-17 DIAGNOSIS — Z3202 Encounter for pregnancy test, result negative: Secondary | ICD-10-CM | POA: Insufficient documentation

## 2014-09-17 DIAGNOSIS — Z79899 Other long term (current) drug therapy: Secondary | ICD-10-CM | POA: Insufficient documentation

## 2014-09-17 LAB — URINALYSIS, ROUTINE W REFLEX MICROSCOPIC
Bilirubin Urine: NEGATIVE
Glucose, UA: NEGATIVE mg/dL
Hgb urine dipstick: NEGATIVE
Ketones, ur: 15 mg/dL — AB
LEUKOCYTES UA: NEGATIVE
NITRITE: NEGATIVE
Protein, ur: NEGATIVE mg/dL
Specific Gravity, Urine: 1.035 — ABNORMAL HIGH (ref 1.005–1.030)
UROBILINOGEN UA: 0.2 mg/dL (ref 0.0–1.0)
pH: 6 (ref 5.0–8.0)

## 2014-09-17 LAB — WET PREP, GENITAL
CLUE CELLS WET PREP: NONE SEEN
TRICH WET PREP: NONE SEEN
YEAST WET PREP: NONE SEEN

## 2014-09-17 LAB — PREGNANCY, URINE: PREG TEST UR: NEGATIVE

## 2014-09-17 MED ORDER — CEFTRIAXONE SODIUM 250 MG IJ SOLR
250.0000 mg | Freq: Once | INTRAMUSCULAR | Status: AC
  Administered 2014-09-17: 250 mg via INTRAMUSCULAR
  Filled 2014-09-17: qty 250

## 2014-09-17 MED ORDER — IBUPROFEN 600 MG PO TABS: 600.0000 mg | ORAL_TABLET | Freq: Three times a day (TID) | ORAL | Status: DC | PRN

## 2014-09-17 MED ORDER — AZITHROMYCIN 250 MG PO TABS
1000.0000 mg | ORAL_TABLET | Freq: Once | ORAL | Status: AC
  Administered 2014-09-17: 1000 mg via ORAL
  Filled 2014-09-17: qty 4

## 2014-09-17 MED ORDER — ONDANSETRON 4 MG PO TBDP: ORAL_TABLET | ORAL | Status: DC

## 2014-09-17 MED ORDER — ACETAMINOPHEN 500 MG PO TABS
1000.0000 mg | ORAL_TABLET | Freq: Once | ORAL | Status: AC
  Administered 2014-09-17: 1000 mg via ORAL
  Filled 2014-09-17: qty 2

## 2014-09-17 MED ORDER — LIDOCAINE HCL (PF) 1 % IJ SOLN
INTRAMUSCULAR | Status: AC
  Administered 2014-09-17: 5 mL
  Filled 2014-09-17: qty 5

## 2014-09-17 MED ORDER — ONDANSETRON 8 MG PO TBDP
8.0000 mg | ORAL_TABLET | Freq: Once | ORAL | Status: AC
  Administered 2014-09-17: 8 mg via ORAL
  Filled 2014-09-17: qty 1

## 2014-09-17 NOTE — ED Notes (Signed)
Patient tolerated medications well. Waiting to d/c after 20 minutes injection.

## 2014-09-17 NOTE — ED Notes (Signed)
Patient states that she is having abdominal pain and nausea x 2 days. The patient points to her generalized pelvic region when asked where the pain is.

## 2014-09-17 NOTE — Discharge Instructions (Signed)
Abdominal Pain, Women °Abdominal (stomach, pelvic, or belly) pain can be caused by many things. It is important to tell your doctor: °· The location of the pain. °· Does it come and go or is it present all the time? °· Are there things that start the pain (eating certain foods, exercise)? °· Are there other symptoms associated with the pain (fever, nausea, vomiting, diarrhea)? °All of this is helpful to know when trying to find the cause of the pain. °CAUSES  °· Stomach: virus or bacteria infection, or ulcer. °· Intestine: appendicitis (inflamed appendix), regional ileitis (Crohn's disease), ulcerative colitis (inflamed colon), irritable bowel syndrome, diverticulitis (inflamed diverticulum of the colon), or cancer of the stomach or intestine. °· Gallbladder disease or stones in the gallbladder. °· Kidney disease, kidney stones, or infection. °· Pancreas infection or cancer. °· Fibromyalgia (pain disorder). °· Diseases of the female organs: °· Uterus: fibroid (non-cancerous) tumors or infection. °· Fallopian tubes: infection or tubal pregnancy. °· Ovary: cysts or tumors. °· Pelvic adhesions (scar tissue). °· Endometriosis (uterus lining tissue growing in the pelvis and on the pelvic organs). °· Pelvic congestion syndrome (female organs filling up with blood just before the menstrual period). °· Pain with the menstrual period. °· Pain with ovulation (producing an egg). °· Pain with an IUD (intrauterine device, birth control) in the uterus. °· Cancer of the female organs. °· Functional pain (pain not caused by a disease, may improve without treatment). °· Psychological pain. °· Depression. °DIAGNOSIS  °Your doctor will decide the seriousness of your pain by doing an examination. °· Blood tests. °· X-rays. °· Ultrasound. °· CT scan (computed tomography, special type of X-ray). °· MRI (magnetic resonance imaging). °· Cultures, for infection. °· Barium enema (dye inserted in the large intestine, to better view it with  X-rays). °· Colonoscopy (looking in intestine with a lighted tube). °· Laparoscopy (minor surgery, looking in abdomen with a lighted tube). °· Major abdominal exploratory surgery (looking in abdomen with a large incision). °TREATMENT  °The treatment will depend on the cause of the pain.  °· Many cases can be observed and treated at home. °· Over-the-counter medicines recommended by your caregiver. °· Prescription medicine. °· Antibiotics, for infection. °· Birth control pills, for painful periods or for ovulation pain. °· Hormone treatment, for endometriosis. °· Nerve blocking injections. °· Physical therapy. °· Antidepressants. °· Counseling with a psychologist or psychiatrist. °· Minor or major surgery. °HOME CARE INSTRUCTIONS  °· Do not take laxatives, unless directed by your caregiver. °· Take over-the-counter pain medicine only if ordered by your caregiver. Do not take aspirin because it can cause an upset stomach or bleeding. °· Try a clear liquid diet (broth or water) as ordered by your caregiver. Slowly move to a bland diet, as tolerated, if the pain is related to the stomach or intestine. °· Have a thermometer and take your temperature several times a day, and record it. °· Bed rest and sleep, if it helps the pain. °· Avoid sexual intercourse, if it causes pain. °· Avoid stressful situations. °· Keep your follow-up appointments and tests, as your caregiver orders. °· If the pain does not go away with medicine or surgery, you may try: °· Acupuncture. °· Relaxation exercises (yoga, meditation). °· Group therapy. °· Counseling. °SEEK MEDICAL CARE IF:  °· You notice certain foods cause stomach pain. °· Your home care treatment is not helping your pain. °· You need stronger pain medicine. °· You want your IUD removed. °· You feel faint or   lightheaded. °· You develop nausea and vomiting. °· You develop a rash. °· You are having side effects or an allergy to your medicine. °SEEK IMMEDIATE MEDICAL CARE IF:  °· Your  pain does not go away or gets worse. °· You have a fever. °· Your pain is felt only in portions of the abdomen. The right side could possibly be appendicitis. The left lower portion of the abdomen could be colitis or diverticulitis. °· You are passing blood in your stools (bright red or black tarry stools, with or without vomiting). °· You have blood in your urine. °· You develop chills, with or without a fever. °· You pass out. °MAKE SURE YOU:  °· Understand these instructions. °· Will watch your condition. °· Will get help right away if you are not doing well or get worse. °Document Released: 10/28/2006 Document Revised: 05/17/2013 Document Reviewed: 11/17/2008 °[employer]® Patient Information ©2015 [employer]. This information is not intended to replace advice given to you by your health care provider. Make sure you discuss any questions you have with your health care provider. ° °Pelvic Pain °Female pelvic pain can be caused by many different things and start from a variety of places. Pelvic pain refers to pain that is located in the lower half of the abdomen and between your hips. The pain may occur over a short period of time (acute) or may be reoccurring (chronic). The cause of pelvic pain may be related to disorders affecting the female reproductive organs (gynecologic), but it may also be related to the bladder, kidney stones, an intestinal complication, or muscle or skeletal problems. Getting help right away for pelvic pain is important, especially if there has been severe, sharp, or a sudden onset of unusual pain. It is also important to get help right away because some types of pelvic pain can be life threatening.  °CAUSES  °Below are only some of the causes of pelvic pain. The causes of pelvic pain can be in one of several categories.  °· Gynecologic. °· Pelvic inflammatory disease. °· Sexually transmitted infection. °· Ovarian cyst or a twisted ovarian ligament (ovarian torsion). °· Uterine lining  that grows outside the uterus (endometriosis). °· Fibroids, cysts, or tumors. °· Ovulation. °· Pregnancy. °· Pregnancy that occurs outside the uterus (ectopic pregnancy). °· Miscarriage. °· Labor. °· Abruption of the placenta or ruptured uterus. °· Infection. °· Uterine infection (endometritis). °· Bladder infection. °· Diverticulitis. °· Miscarriage related to a uterine infection (septic abortion). °· Bladder. °· Inflammation of the bladder (cystitis). °· Kidney stone(s). °· Gastrointestinal. °· Constipation. °· Diverticulitis. °· Neurologic. °· Trauma. °· Feeling pelvic pain because of mental or emotional causes (psychosomatic). °· Cancers of the bowel or pelvis. °EVALUATION  °Your caregiver will want to take a careful history of your concerns. This includes recent changes in your health, a careful gynecologic history of your periods (menses), and a sexual history. Obtaining your family history and medical history is also important. Your caregiver may suggest a pelvic exam. A pelvic exam will help identify the location and severity of the pain. It also helps in the evaluation of which organ system may be involved. In order to identify the cause of the pelvic pain and be properly treated, your caregiver may order tests. These tests may include:  °· A pregnancy test. °· Pelvic ultrasonography. °· An X-ray exam of the abdomen. °· A urinalysis or evaluation of vaginal discharge. °· Blood tests. °HOME CARE INSTRUCTIONS  °· Only take over-the-counter or prescription medicines for   pain, discomfort, or fever as directed by your caregiver.   °· Rest as directed by your caregiver.   °· Eat a balanced diet.   °· Drink enough fluids to make your urine clear or pale yellow, or as directed.   °· Avoid sexual intercourse if it causes pain.   °· Apply warm or cold compresses to the lower abdomen depending on which one helps the pain.   °· Avoid stressful situations.   °· Keep a journal of your pelvic pain. Write down when it  started, where the pain is located, and if there are things that seem to be associated with the pain, such as food or your menstrual cycle. °· Follow up with your caregiver as directed.   °SEEK MEDICAL CARE IF: °· Your medicine does not help your pain. °· You have abnormal vaginal discharge. °SEEK IMMEDIATE MEDICAL CARE IF:  °· You have heavy bleeding from the vagina.   °· Your pelvic pain increases.   °· You feel light-headed or faint.   °· You have chills.   °· You have pain with urination or blood in your urine.   °· You have uncontrolled diarrhea or vomiting.   °· You have a fever or persistent symptoms for more than 3 days. °· You have a fever and your symptoms suddenly get worse.   °· You are being physically or sexually abused.   °MAKE SURE YOU: °· Understand these instructions. °· Will watch your condition. °· Will get help if you are not doing well or get worse. °Document Released: 11/28/2003 Document Revised: 05/17/2013 Document Reviewed: 04/22/2011 °[employer]® Patient Information ©2015 [employer]. This information is not intended to replace advice given to you by your health care provider. Make sure you discuss any questions you have with your health care provider. °Vaginitis °Vaginitis is an inflammation of the vagina. It is most often caused by a change in the normal balance of the bacteria and yeast that live in the vagina. This change in balance causes an overgrowth of certain bacteria or yeast, which causes the inflammation. There are different types of vaginitis, but the most common types are: °· Bacterial vaginosis. °· Yeast infection (candidiasis). °· Trichomoniasis vaginitis. This is a sexually transmitted infection (STI). °· Viral vaginitis. °· Atropic vaginitis. °· Allergic vaginitis. °CAUSES  °The cause depends on the type of vaginitis. Vaginitis can be caused by: °· Bacteria (bacterial vaginosis). °· Yeast (yeast infection). °· A parasite (trichomoniasis vaginitis) °· A virus (viral  vaginitis). °· Low hormone levels (atrophic vaginitis). Low hormone levels can occur during pregnancy, breastfeeding, or after menopause. °· Irritants, such as bubble baths, scented tampons, and feminine sprays (allergic vaginitis). °Other factors can change the normal balance of the yeast and bacteria that live in the vagina. These include: °· Antibiotic medicines. °· Poor hygiene. °· Diaphragms, vaginal sponges, spermicides, birth control pills, and intrauterine devices (IUD). °· Sexual intercourse. °· Infection. °· Uncontrolled diabetes. °· A weakened immune system. °SYMPTOMS  °Symptoms can vary depending on the cause of the vaginitis. Common symptoms include: °· Abnormal vaginal discharge. °¨ The discharge is [place], gray, or yellow with bacterial vaginosis. °¨ The discharge is thick, Kiesel, and cheesy with a yeast infection. °¨ The discharge is frothy and yellow or greenish with trichomoniasis. °· A bad vaginal odor. °¨ The odor is fishy with bacterial vaginosis. °· Vaginal itching, pain, or swelling. °· Painful intercourse. °· Pain or burning when urinating. °Sometimes, there are no symptoms. °TREATMENT  °Treatment will vary depending on the type of infection.  °· Bacterial vaginosis and trichomoniasis are often treated with antibiotic   creams or pills. °· Yeast infections are often treated with antifungal medicines, such as vaginal creams or suppositories. °· Viral vaginitis has no cure, but symptoms can be treated with medicines that relieve discomfort. Your sexual partner should be treated as well. °· Atrophic vaginitis may be treated with an estrogen cream, pill, suppository, or vaginal ring. If vaginal dryness occurs, lubricants and moisturizing creams may help. You may be told to avoid scented soaps, sprays, or douches. °· Allergic vaginitis treatment involves quitting the use of the product that is causing the problem. Vaginal creams can be used to treat the symptoms. °HOME CARE INSTRUCTIONS  °· Take all  medicines as directed by your caregiver. °· Keep your genital area clean and dry. Avoid soap and only rinse the area with water. °· Avoid douching. It can remove the healthy bacteria in the vagina. °· Do not use tampons or have sexual intercourse until your vaginitis has been treated. Use sanitary pads while you have vaginitis. °· Wipe from front to back. This avoids the spread of bacteria from the rectum to the vagina. °· Let air reach your genital area. °¨ Wear cotton underwear to decrease moisture buildup. °¨ Avoid wearing underwear while you sleep until your vaginitis is gone. °¨ Avoid tight pants and underwear or nylons without a cotton panel. °¨ Take off wet clothing (especially bathing suits) as soon as possible. °· Use mild, non-scented products. Avoid using irritants, such as: °¨ Scented feminine sprays. °¨ Fabric softeners. °¨ Scented detergents. °¨ Scented tampons. °¨ Scented soaps or bubble baths. °· Practice safe sex and use condoms. Condoms may prevent the spread of trichomoniasis and viral vaginitis. °SEEK MEDICAL CARE IF:  °· You have abdominal pain. °· You have a fever or persistent symptoms for more than 2-3 days. °· You have a fever and your symptoms suddenly get worse. °Document Released: 10/28/2006 Document Revised: 09/25/2011 Document Reviewed: 06/13/2011 °[employer]® Patient Information ©2015 [employer]. This information is not intended to replace advice given to you by your health care provider. Make sure you discuss any questions you have with your health care provider. ° °

## 2014-09-17 NOTE — ED Provider Notes (Signed)
CSN: [id number]     Arrival date & time 09/17/14  0406 History   First MD Initiated Contact with Patient 09/17/14 0410     Chief Complaint  Patient presents with  . Pelvic Pain     (Consider location/radiation/quality/duration/timing/severity/associated sxs/prior Treatment) HPI  19 year old female presents with lower abdominal pain 1 week. Has been having vaginal discharge as well. Low back pain accompanies this pain. Patient states the pain comes and goes and feels sharp and crampy. Last menstrual cycle was 7/20. She feels like she has missed one cycle. No vaginal bleeding. No urinary symptoms. No fevers. Intermittent nausea. Pain is not particularly worsening but not going away so she came in for evaluation. Rates her pain as a 6/10.  Past Medical History  Diagnosis Date  . Ovarian cyst   . GERD (gastroesophageal reflux disease)    History reviewed. No pertinent past surgical history. History reviewed. No pertinent family history. Social History  Substance Use Topics  . Smoking status: Never Smoker   . Smokeless tobacco: None  . Alcohol Use: No   OB History    No data available     Review of Systems  Constitutional: Negative for fever.  Gastrointestinal: Positive for nausea and abdominal pain. Negative for vomiting.  Genitourinary: Positive for vaginal discharge and menstrual problem. Negative for dysuria, hematuria and vaginal bleeding.  All other systems reviewed and are negative.     Allergies  Review of patient's allergies indicates no known allergies.  Home Medications   Prior to Admission medications   Medication Sig Start Date End Date Taking? Authorizing Provider  HYDROcodone-acetaminophen (NORCO/VICODIN) 5-325 MG per tablet Take 2 tablets by mouth every 4 (four) hours as needed. 02/11/13   Glynn Octave, MD  HYDROcodone-acetaminophen (NORCO/VICODIN) 5-325 MG per tablet Take 1-2 tablets by mouth every 6 (six) hours as needed for moderate pain. 05/17/13   Vanetta Mulders, MD  ibuprofen (ADVIL,MOTRIN) 800 MG tablet Take 1 tablet (800 mg total) by mouth 3 (three) times daily. 02/11/13   Glynn Octave, MD  metroNIDAZOLE (FLAGYL) 500 MG tablet Take 1 tablet (500 mg total) by mouth 2 (two) times daily. 02/11/13   Glynn Octave, MD  omeprazole (PRILOSEC) 10 MG capsule Take 10 mg by mouth daily.    Historical Provider, MD  promethazine (PHENERGAN) 25 MG tablet Take 25 mg by mouth every 6 (six) hours as needed for nausea or vomiting.    Historical Provider, MD  promethazine (PHENERGAN) 25 MG tablet Take 1 tablet (25 mg total) by mouth every 6 (six) hours as needed for nausea or vomiting. 05/17/13   Vanetta Mulders, MD  traMADol (ULTRAM) 50 MG tablet Take by mouth every 6 (six) hours as needed.    Historical Provider, MD  UNKNOWN TO PATIENT abx    Historical Provider, MD   BP 117/68 mmHg  Pulse 76  Temp(Src) 98.5 F (36.9 C) (Oral)  Resp 16  Ht 5\' 3"  (1.6 m)  Wt 230 lb (104.327 kg)  BMI 40.75 kg/m2  SpO2 100%  LMP 08/05/2014 Physical Exam  Constitutional: She is oriented to person, place, and time. She appears well-developed and well-nourished.  HENT:  Head: Normocephalic and atraumatic.  Right Ear: External ear normal.  Left Ear: External ear normal.  Nose: Nose normal.  Eyes: Right eye exhibits no discharge. Left eye exhibits no discharge.  Cardiovascular: Normal rate, regular rhythm and normal heart sounds.   Pulmonary/Chest: Effort normal and breath sounds normal.  Abdominal: Soft. There is tenderness (mild) in  the suprapubic area. There is no CVA tenderness.  Genitourinary: Uterus is not enlarged and not tender. Cervix exhibits no motion tenderness. Right adnexum displays no mass and no tenderness. Left adnexum displays no mass and no tenderness. Vaginal discharge found.  Neurological: She is alert and oriented to person, place, and time.  Skin: Skin is warm and dry.  Nursing note and vitals reviewed.   ED Course  Procedures (including  critical care time) Labs Review Labs Reviewed  WET PREP, GENITAL - Abnormal; Notable for the following:    WBC, Wet Prep HPF POC FEW (*)    All other components within normal limits  URINALYSIS, ROUTINE W REFLEX MICROSCOPIC (NOT AT [facility]) - Abnormal; Notable for the following:    APPearance CLOUDY (*)    Specific Gravity, Urine 1.035 (*)    Ketones, ur 15 (*)    All other components within normal limits  PREGNANCY, URINE  GC/CHLAMYDIA PROBE AMP ([place]) NOT AT [facility]    Imaging Review No results found. I have personally reviewed and evaluated these images and lab results as part of my medical decision-making.   EKG Interpretation None      MDM   Final diagnoses:  Vaginitis  Pelvic pain in female    Patient with mild pelvic tenderness in the midline. No evidence of UTI. Given her vaginal discharge is likely related to vaginitis. No cervical motion tenderness to suggest PID. No lateralizing tenderness to suggest an ovarian process. No acute imaging indicated at this time. Vital signs unremarkable and I do not feel CT or ultrasound is warranted. Pain appears benign. Recommend ibuprofen, Tylenol, and will give Zofran for nausea. No sign of pregnancy. Given Rocephin/azithromycin and will recommend follow-up with OB/GYN. Discussed return precautions.     Pricilla Loveless, MD 09/17/14 [phone]

## 2014-09-20 LAB — GC/CHLAMYDIA PROBE AMP (~~LOC~~) NOT AT ARMC
CHLAMYDIA, DNA PROBE: NEGATIVE
Neisseria Gonorrhea: NEGATIVE

## 2014-10-22 ENCOUNTER — Emergency Department (HOSPITAL_BASED_OUTPATIENT_CLINIC_OR_DEPARTMENT_OTHER)
Admission: EM | Admit: 2014-10-22 | Discharge: 2014-10-22 | Disposition: A | Discharge status: Home / Self Care | Payer: Medicaid Other | Attending: Emergency Medicine | Admitting: Emergency Medicine

## 2014-10-22 ENCOUNTER — Encounter (HOSPITAL_BASED_OUTPATIENT_CLINIC_OR_DEPARTMENT_OTHER): Payer: Self-pay | Admitting: *Deleted

## 2014-10-22 DIAGNOSIS — K921 Melena: Secondary | ICD-10-CM | POA: Insufficient documentation

## 2014-10-22 DIAGNOSIS — Z79899 Other long term (current) drug therapy: Secondary | ICD-10-CM | POA: Insufficient documentation

## 2014-10-22 DIAGNOSIS — K219 Gastro-esophageal reflux disease without esophagitis: Secondary | ICD-10-CM | POA: Insufficient documentation

## 2014-10-22 DIAGNOSIS — K5900 Constipation, unspecified: Secondary | ICD-10-CM | POA: Insufficient documentation

## 2014-10-22 DIAGNOSIS — Z8742 Personal history of other diseases of the female genital tract: Secondary | ICD-10-CM | POA: Insufficient documentation

## 2014-10-22 MED ORDER — DOCUSATE SODIUM 100 MG PO CAPS: 100.0000 mg | ORAL_CAPSULE | Freq: Two times a day (BID) | ORAL | Status: DC

## 2014-10-22 NOTE — ED Notes (Addendum)
Constipation x 2 weeks  w some rectal bleeding this pm

## 2014-10-22 NOTE — ED Provider Notes (Signed)
CSN: [id number]     Arrival date & time 10/22/14  4098 History   First MD Initiated Contact with Patient 10/22/14 [phone]     Chief Complaint  Patient presents with  . Constipation    Patient is a 19 y.o. female presenting with constipation. The history is provided by the patient.  Constipation Severity:  Moderate Time since last bowel movement:  2 weeks Timing:  Intermittent Progression:  Worsening Chronicity:  New Stool description:  Hard Relieved by:  Nothing Worsened by:  Nothing tried Associated symptoms: hematochezia   Associated symptoms: no abdominal pain, no back pain, no fever and no vomiting   Risk factors: no hx of abdominal surgery and no recent surgery     Past Medical History  Diagnosis Date  . Ovarian cyst   . GERD (gastroesophageal reflux disease)    History reviewed. No pertinent past surgical history. No family history on file. Social History  Substance Use Topics  . Smoking status: Never Smoker   . Smokeless tobacco: None  . Alcohol Use: Yes   OB History    No data available     Review of Systems  Constitutional: Negative for fever.  Gastrointestinal: Positive for constipation, blood in stool and hematochezia. Negative for vomiting and abdominal pain.  Musculoskeletal: Negative for back pain.      Allergies  Review of patient's allergies indicates no known allergies.  Home Medications   Prior to Admission medications   Medication Sig Start Date End Date Taking? Authorizing Provider  ibuprofen (ADVIL,MOTRIN) 600 MG tablet Take 1 tablet (600 mg total) by mouth every 8 (eight) hours as needed. 09/17/14   Pricilla Loveless, MD  omeprazole (PRILOSEC) 10 MG capsule Take 10 mg by mouth daily.    Historical Provider, MD  UNKNOWN TO PATIENT abx    Historical Provider, MD   BP 116/64 mmHg  Pulse 78  Temp(Src) 99 F (37.2 C) (Oral)  Resp 18  Ht  (1.6 m)  Wt 230 lb (104.327 kg)  BMI 40.75 kg/m2  SpO2 100% Physical Exam CONSTITUTIONAL: Well  developed/well nourished HEAD: Normocephalic/atraumatic EYES: EOMI ENMT: Mucous membranes moist NECK: supple no meningeal signs CV: S1/S2 noted, no murmurs/rubs/gallops noted LUNGS: Lungs are clear to auscultation bilaterally, no apparent distress ABDOMEN: soft, nontender, no rebound or guarding, bowel sounds noted throughout abdomen Rectal - no stool impaction.  No hemorrhoids.  No mass noted.  No melena.  No blood noted.  Female nurse chaperone present NEURO: Pt is awake/alert/appropriate, moves all extremitiesx4.  EXTREMITIES: pulses normal/equal, full ROM SKIN: warm, color normal PSYCH: no abnormalities of mood noted, alert and oriented to situation  ED Course  Procedures  Will start stool softeners Stable for d/c home We discussed strict return precautions  MDM   Final diagnoses:  Constipation, unspecified constipation type    Nursing notes including past medical history and social history reviewed and considered in documentation     Zadie Rhine, MD 10/22/14 [phone]

## 2014-10-22 NOTE — Discharge Instructions (Signed)

## 2014-10-22 NOTE — ED Notes (Signed)
Constipation x 2 weeks  Has not taken any meds for same,  States some rectum bleeding this pm   Denies abd pain

## 2016-05-09 ENCOUNTER — Emergency Department (HOSPITAL_BASED_OUTPATIENT_CLINIC_OR_DEPARTMENT_OTHER)
Admission: EM | Admit: 2016-05-09 | Discharge: 2016-05-09 | Disposition: A | Discharge status: Home / Self Care | Payer: Medicaid Other | Attending: Emergency Medicine | Admitting: Emergency Medicine

## 2016-05-09 ENCOUNTER — Encounter (HOSPITAL_BASED_OUTPATIENT_CLINIC_OR_DEPARTMENT_OTHER): Payer: Self-pay | Admitting: *Deleted

## 2016-05-09 ENCOUNTER — Emergency Department (HOSPITAL_BASED_OUTPATIENT_CLINIC_OR_DEPARTMENT_OTHER): Payer: Medicaid Other

## 2016-05-09 DIAGNOSIS — Y999 Unspecified external cause status: Secondary | ICD-10-CM | POA: Insufficient documentation

## 2016-05-09 DIAGNOSIS — S9032XA Contusion of left foot, initial encounter: Secondary | ICD-10-CM | POA: Insufficient documentation

## 2016-05-09 DIAGNOSIS — W208XXA Other cause of strike by thrown, projected or falling object, initial encounter: Secondary | ICD-10-CM | POA: Insufficient documentation

## 2016-05-09 DIAGNOSIS — Y939 Activity, unspecified: Secondary | ICD-10-CM | POA: Insufficient documentation

## 2016-05-09 DIAGNOSIS — Y929 Unspecified place or not applicable: Secondary | ICD-10-CM | POA: Insufficient documentation

## 2016-05-09 MED ORDER — NAPROXEN 375 MG PO TABS: 375.0000 mg | ORAL_TABLET | Freq: Two times a day (BID) | ORAL | 0 refills | Status: DC

## 2016-05-09 NOTE — Discharge Instructions (Signed)
Contact a health care provider if: °Your symptoms do not improve after several days of treatment. °You have more redness, swelling, or pain in your foot or toes. °You have difficulty moving the injured area. °Your swelling or pain is not relieved with medicines. °Get help right away if: °You have severe pain. °Your foot or toes become numb. °Your foot or toes become pale or cold. °You cannot move your foot or ankle. °Your foot is warm to the touch. °

## 2016-05-09 NOTE — ED Notes (Signed)
Dropped a pc of metal on left foot 2 days ago  c/o pain and knot on outside of left foot

## 2016-05-09 NOTE — ED Provider Notes (Signed)
[facility]-EMERGENCY DEPT Provider Note   CSN: [id number] Arrival date & time: 05/09/16  2136  By signing my name below, I, Modena Jansky, attest that this documentation has been prepared under the direction and in the presence of non-physician practitioner, Arthor Captain, PA-C. Electronically Signed: Modena Jansky, Scribe. 05/09/2016. 11:23 PM.  History   Chief Complaint Chief Complaint  Patient presents with  . Foot Injury   The history is provided by the patient. No language interpreter was used.   HPI Comments: Miranda Mack is a 21 y.o. female who presents to the Emergency Department complaining of constant moderate left foot that started 2 days ago. She states she dropped something on her left foot. She took ibuprofen, Tylenol, and Advil with minimal relief. Her pain is exacerbated by left foot movement. Denies any gait problem or other complaints at this time.  Past Medical History:  Diagnosis Date  . GERD (gastroesophageal reflux disease)   . Ovarian cyst     There are no active problems to display for this patient.   History reviewed. No pertinent surgical history.  OB History    No data available       Home Medications    Prior to Admission medications   Medication Sig Start Date End Date Taking? Authorizing Provider  docusate sodium (COLACE) 100 MG capsule Take 1 capsule (100 mg total) by mouth every 12 (twelve) hours. 10/22/14   Zadie Rhine, MD  ibuprofen (ADVIL,MOTRIN) 600 MG tablet Take 1 tablet (600 mg total) by mouth every 8 (eight) hours as needed. 09/17/14   Pricilla Loveless, MD  naproxen (NAPROSYN) 375 MG tablet Take 1 tablet (375 mg total) by mouth 2 (two) times daily. 05/09/16   Arthor Captain, PA-C  omeprazole (PRILOSEC) 10 MG capsule Take 10 mg by mouth daily.    Historical Provider, MD  UNKNOWN TO PATIENT abx    Historical Provider, MD    Family History No family history on file.  Social History Social History  Substance Use Topics  . Smoking  status: Never Smoker  . Smokeless tobacco: Not on file  . Alcohol use Yes     Allergies   Patient has no known allergies.   Review of Systems Review of Systems  Constitutional: Negative for fever.  Respiratory: Negative for apnea.   Musculoskeletal: Positive for myalgias (Left foot). Negative for gait problem.     Physical Exam Updated Vital Signs BP 124/78   Pulse 82   Temp 98.9 F (37.2 C)   Resp 16   Ht  (1.6 m)   Wt 230 lb (104.3 kg)   LMP 04/10/2016   SpO2 99%   BMI 40.74 kg/m   Physical Exam  Constitutional: She appears well-developed and well-nourished. No distress.  HENT:  Head: Normocephalic.  Eyes: Conjunctivae are normal.  Neck: Neck supple.  Cardiovascular: Normal rate and regular rhythm.   Pulmonary/Chest: Effort normal.  Abdominal: Soft.  Musculoskeletal: Normal range of motion.  3 cm area of tenderness and swelling over the middle of the left 5th metatarsal. No overt bruising or deformity. Normal ROM of the pelvis. Normal pulses bilaterally.   Neurological: She is alert.  Skin: Skin is warm and dry.  Psychiatric: She has a normal mood and affect.  Nursing note and vitals reviewed.    ED Treatments / Results  DIAGNOSTIC STUDIES: Oxygen Saturation is 99% on RA, normal by my interpretation.    COORDINATION OF CARE: 11:27 PM- Pt advised of plan for treatment and pt agrees.  Labs (all labs ordered are listed, but only abnormal results are displayed) Labs Reviewed - No data to display  EKG  EKG Interpretation None       Radiology No results found.  Procedures Procedures (including critical care time)  Medications Ordered in ED Medications - No data to display   Initial Impression / Assessment and Plan / ED Course  I have reviewed the triage vital signs and the nursing notes.  Pertinent labs & imaging results that were available during my care of the patient were reviewed by me and considered in my medical decision making  (see chart for details).      Patient X-Ray negative for obvious fracture or dislocation. Pain managed in ED. Pt advised to follow up with orthopedics if symptoms persist for possibility of missed fracture diagnosis. Patient given brace while in ED, conservative therapy recommended and discussed. Patient will be dc home & is agreeable with above plan.  Final Clinical Impressions(s) / ED Diagnoses   Final diagnoses:  Traumatic hematoma of left foot, initial encounter    New Prescriptions Discharge Medication List as of 05/09/2016 11:41 PM    START taking these medications   Details  naproxen (NAPROSYN) 375 MG tablet Take 1 tablet (375 mg total) by mouth 2 (two) times daily., Starting Thu 05/09/2016, Print       I personally performed the services described in this documentation, which was scribed in my presence. The recorded information has been reviewed and is accurate.        Arthor Captain, PA-C 05/13/16 4098    Canary Brim Tegeler, MD 05/13/16 2137

## 2016-05-09 NOTE — ED Triage Notes (Signed)
pt c/o left foot injury x 2 days ago , dropped heavy object on foot

## 2016-06-02 ENCOUNTER — Emergency Department (HOSPITAL_BASED_OUTPATIENT_CLINIC_OR_DEPARTMENT_OTHER)
Admission: EM | Admit: 2016-06-02 | Discharge: 2016-06-02 | Disposition: A | Discharge status: Home / Self Care | Payer: Self-pay | Attending: Emergency Medicine | Admitting: Emergency Medicine

## 2016-06-02 ENCOUNTER — Emergency Department (HOSPITAL_BASED_OUTPATIENT_CLINIC_OR_DEPARTMENT_OTHER): Payer: Self-pay

## 2016-06-02 ENCOUNTER — Encounter (HOSPITAL_BASED_OUTPATIENT_CLINIC_OR_DEPARTMENT_OTHER): Payer: Self-pay | Admitting: *Deleted

## 2016-06-02 DIAGNOSIS — S0990XA Unspecified injury of head, initial encounter: Secondary | ICD-10-CM | POA: Insufficient documentation

## 2016-06-02 DIAGNOSIS — Y9389 Activity, other specified: Secondary | ICD-10-CM | POA: Insufficient documentation

## 2016-06-02 DIAGNOSIS — Y998 Other external cause status: Secondary | ICD-10-CM | POA: Insufficient documentation

## 2016-06-02 DIAGNOSIS — R42 Dizziness and giddiness: Secondary | ICD-10-CM | POA: Insufficient documentation

## 2016-06-02 DIAGNOSIS — R51 Headache: Secondary | ICD-10-CM | POA: Insufficient documentation

## 2016-06-02 DIAGNOSIS — Y92410 Unspecified street and highway as the place of occurrence of the external cause: Secondary | ICD-10-CM | POA: Insufficient documentation

## 2016-06-02 DIAGNOSIS — S8991XA Unspecified injury of right lower leg, initial encounter: Secondary | ICD-10-CM | POA: Insufficient documentation

## 2016-06-02 MED ORDER — BACLOFEN 10 MG PO TABS: 10.0000 mg | ORAL_TABLET | Freq: Three times a day (TID) | ORAL | 0 refills | Status: DC

## 2016-06-02 MED ORDER — MELOXICAM 15 MG PO TABS: 15.0000 mg | ORAL_TABLET | Freq: Every day | ORAL | 0 refills | Status: DC
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
## 2016-06-02 NOTE — ED Notes (Signed)
[facility] was taken to radiology [facility]

## 2016-06-02 NOTE — Discharge Instructions (Signed)
Return to the emergency department immediately if you develop any of the following symptoms: You have numbness, tingling, or weakness in the arms or legs. You develop severe headaches not relieved with medicine. You have severe neck pain, especially tenderness in the middle of the back of your neck. You have changes in bowel or bladder control. There is increasing pain in any area of the body. You have shortness of breath, light-headedness, dizziness, or fainting. You have chest pain. You feel sick to your stomach (nauseous), throw up (vomit), or sweat. You have increasing abdominal discomfort. There is blood in your urine, stool, or vomit. You have pain in your shoulder (shoulder strap areas). You feel your symptoms are getting worse.  Marland Kitchen.Marland Kitchen.HEAD INJURY If any of the following occur notify your physician or go to the Hospital Emergency Department:  Increased drowsiness, stupor or loss of consciousness  Restlessness or convulsions (fits)  Paralysis in arms or legs  Temperature above 100 F  Vomiting  Severe headache  Blood or clear fluid dripping from the nose or ears  Stiffness of the neck  Dizziness or blurred vision  Pulsating pain in the eye  Unequal pupils of eye  Personality changes  Any other unusual symptoms PRECAUTIONS  Keep head elevated at all times for the first 24 hours (Elevate mattress if pillow is ineffective)  Do not take tranquilizers, sedatives, narcotics or alcohol  Avoid aspirin. Use only acetaminophen (e.g. Tylenol) or ibuprofen (e.g. Advil) for relief of pain. Follow directions on the bottle for dosage.  Use ice packs for comfort Have parent, spouse, or friend awaken patient every 5 hour(s) and assess. MEDICATIONS Use medications only as directed by your physician
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
## 2016-06-02 NOTE — ED Notes (Signed)
Pt given d/c instructions as per chart. Rx x 2. Verbalizes understanding. No questions. 

## 2017-02-27 ENCOUNTER — Encounter (HOSPITAL_BASED_OUTPATIENT_CLINIC_OR_DEPARTMENT_OTHER): Payer: Self-pay | Admitting: *Deleted

## 2017-02-27 ENCOUNTER — Emergency Department (HOSPITAL_BASED_OUTPATIENT_CLINIC_OR_DEPARTMENT_OTHER)
Admission: EM | Admit: 2017-02-27 | Discharge: 2017-02-27 | Disposition: A | Discharge status: Home / Self Care | Payer: Self-pay | Attending: Emergency Medicine | Admitting: Emergency Medicine

## 2017-02-27 ENCOUNTER — Other Ambulatory Visit: Payer: Self-pay

## 2017-02-27 DIAGNOSIS — L509 Urticaria, unspecified: Secondary | ICD-10-CM | POA: Insufficient documentation

## 2017-02-27 DIAGNOSIS — Z79899 Other long term (current) drug therapy: Secondary | ICD-10-CM | POA: Insufficient documentation

## 2017-02-27 MED ORDER — FAMOTIDINE IN NACL 20-0.9 MG/50ML-% IV SOLN
20.0000 mg | Freq: Once | INTRAVENOUS | Status: AC
  Administered 2017-02-27: 20 mg via INTRAVENOUS
  Filled 2017-02-27: qty 50

## 2017-02-27 MED ORDER — DEXAMETHASONE SODIUM PHOSPHATE 10 MG/ML IJ SOLN
10.0000 mg | Freq: Once | INTRAMUSCULAR | Status: AC
  Administered 2017-02-27: 10 mg via INTRAVENOUS
  Filled 2017-02-27: qty 1

## 2017-02-27 MED ORDER — HYDROXYZINE HCL 25 MG PO TABS: 25.0000 mg | ORAL_TABLET | Freq: Four times a day (QID) | ORAL | 0 refills | Status: DC | PRN

## 2017-02-27 NOTE — ED Provider Notes (Signed)
[facility] Provider Note: Lowella Dell, MD, FACEP  CSN: [id number] MRN: [id number] ARRIVAL: 02/27/17 at 0600 ROOM: MH09/[facility]09   CHIEF COMPLAINT  Urticaria   HISTORY OF PRESENT ILLNESS  02/27/17 6:18 AM Miranda Mack is a 22 y.o. female with a one-week history of an urticarial rash.  The rash has been occurring off and on.  She does not know what triggers it although she is using a new soap.  Yesterday the hives were associated with swelling of the lower lip.  She took Benadryl 25 mg with improvement in the lip swelling.  She denies any throat swelling, shortness of breath, wheezing, nausea, vomiting or diarrhea.  She is typically been taking 25 mg of Benadryl every 5 hours without adequate relief but took 50 mg of Benadryl prior to coming to the ED this morning.  The rash is generalized but most prominent on the trunk.  It is pruritic but not severe.   Past Medical History:  Diagnosis Date  . GERD (gastroesophageal reflux disease)   . Ovarian cyst     History reviewed. No pertinent surgical history.  No family history on file.  Social History   Tobacco Use  . Smoking status: Never Smoker  Substance Use Topics  . Alcohol use: Yes  . Drug use: No    Prior to Admission medications   Medication Sig Start Date End Date Taking? Authorizing Provider  baclofen (LIORESAL) 10 MG tablet Take 1 tablet (10 mg total) by mouth 3 (three) times daily. 06/02/16   Arthor Captain, PA-C  docusate sodium (COLACE) 100 MG capsule Take 1 capsule (100 mg total) by mouth every 12 (twelve) hours. 10/22/14   Zadie Rhine, MD  ibuprofen (ADVIL,MOTRIN) 600 MG tablet Take 1 tablet (600 mg total) by mouth every 8 (eight) hours as needed. 09/17/14   Pricilla Loveless, MD  meloxicam (MOBIC) 15 MG tablet Take 1 tablet (15 mg total) by mouth daily. Take 1 daily with food. 06/02/16   Harris, Cammy Copa, PA-C  naproxen (NAPROSYN) 375 MG tablet Take 1 tablet (375 mg total) by mouth 2 (two) times  daily. 05/09/16   Arthor Captain, PA-C  omeprazole (PRILOSEC) 10 MG capsule Take 10 mg by mouth daily.    [provider]  UNKNOWN TO PATIENT abx    [provider]    Allergies Patient has no known allergies.   REVIEW OF SYSTEMS  Negative except as noted here or in the History of Present Illness.   PHYSICAL EXAMINATION  Initial Vital Signs Blood pressure 138/80, pulse (!) 110, temperature 98.7 F (37.1 C), temperature source Oral, resp. rate 20, height 5\' 3"  (1.6 m), weight 122.5 kg (270 lb), last menstrual period 02/09/2017, SpO2 100 %.  Examination General: Well-developed, well-nourished female in no acute distress; appearance consistent with age of record HENT: normocephalic; atraumatic; no pharyngeal edema; no edema of lips; no dysphonia Eyes: pupils equal, round and reactive to light; extraocular muscles intact Neck: supple Heart: regular rate and rhythm Lungs: clear to auscultation bilaterally Abdomen: soft; nondistended; nontender; bowel sounds present Extremities: No deformity; full range of motion Neurologic: Awake, alert and oriented; motor function intact in all extremities and symmetric; no facial droop Skin: Warm and dry; generalized but sparse urticarial rash most prominent on the trunk Psychiatric: Normal mood and affect   RESULTS  Summary of this visit's results, reviewed by myself:   EKG Interpretation  Date/Time:    Ventricular Rate:    PR Interval:    QRS Duration:  QT Interval:    QTC Calculation:   R Axis:     Text Interpretation:        Laboratory Studies: No results found for this or any previous visit (from the past 24 hour(s)). Imaging Studies: No results found.  ED COURSE  Nursing notes and initial vitals signs, including pulse oximetry, reviewed.  Vitals:   02/27/17 0607  BP: 138/80  Pulse: (!) 110  Resp: 20  Temp: 98.7 F (37.1 C)  TempSrc: Oral  SpO2: 100%  Weight: 122.5 kg (270 lb)  Height: 5\' 3"  (1.6  m)   Patient given dexamethasone and Pepcid IV.  No Benadryl given as she had already taken Benadryl prior to arrival.  We will prescribe hydroxyzine for use when Benadryl is not effective.  She was advised that hydroxyzine may be more sedating than Benadryl.  Will refer to an allergist for further evaluation if symptoms persist.  PROCEDURES    ED DIAGNOSES     ICD-10-CM   1. Urticaria L50.9        Junice Fei, Jonny RuizJohn, MD 02/27/17 [phone]

## 2017-02-27 NOTE — ED Triage Notes (Signed)
C/o hives over body off and on x 1 week  States yesterday lip was swollen  Took benadryl lip swelling went down

## 2017-03-23 ENCOUNTER — Other Ambulatory Visit: Payer: Self-pay

## 2017-03-23 ENCOUNTER — Emergency Department (HOSPITAL_BASED_OUTPATIENT_CLINIC_OR_DEPARTMENT_OTHER)
Admission: EM | Admit: 2017-03-23 | Discharge: 2017-03-23 | Disposition: A | Discharge status: Home / Self Care | Payer: Self-pay | Attending: Emergency Medicine | Admitting: Emergency Medicine

## 2017-03-23 ENCOUNTER — Encounter (HOSPITAL_BASED_OUTPATIENT_CLINIC_OR_DEPARTMENT_OTHER): Payer: Self-pay | Admitting: Emergency Medicine

## 2017-03-23 DIAGNOSIS — T7840XA Allergy, unspecified, initial encounter: Secondary | ICD-10-CM | POA: Insufficient documentation

## 2017-03-23 MED ORDER — PREDNISONE 50 MG PO TABS
60.0000 mg | ORAL_TABLET | Freq: Once | ORAL | Status: AC
  Administered 2017-03-23: 60 mg via ORAL
  Filled 2017-03-23: qty 1

## 2017-03-23 MED ORDER — DIPHENHYDRAMINE HCL 25 MG PO CAPS
50.0000 mg | ORAL_CAPSULE | Freq: Once | ORAL | Status: AC
  Administered 2017-03-23: 50 mg via ORAL
  Filled 2017-03-23: qty 2

## 2017-03-23 MED ORDER — LORATADINE 10 MG PO TABS: 10.0000 mg | ORAL_TABLET | Freq: Every day | ORAL | 0 refills | Status: DC

## 2017-03-23 MED ORDER — FAMOTIDINE 20 MG PO TABS
20.0000 mg | ORAL_TABLET | Freq: Once | ORAL | Status: AC
  Administered 2017-03-23: 20 mg via ORAL
  Filled 2017-03-23: qty 1

## 2017-03-23 MED ORDER — EPINEPHRINE 0.3 MG/0.3ML IJ SOAJ: 0.3000 mg | Freq: Once | INTRAMUSCULAR | 1 refills | Status: AC

## 2017-03-23 MED ORDER — LORATADINE 10 MG PO TABS
10.0000 mg | ORAL_TABLET | Freq: Once | ORAL | Status: AC
  Administered 2017-03-23: 10 mg via ORAL
  Filled 2017-03-23: qty 1

## 2017-03-23 MED ORDER — PREDNISONE 20 MG PO TABS: 40.0000 mg | ORAL_TABLET | Freq: Every day | ORAL | 0 refills | Status: DC

## 2017-03-23 NOTE — ED Triage Notes (Signed)
Pt reports she has been getting hives for several weeks. She woke up this morning with swelling to lip and difficulty swallowing. No distress at this time.

## 2017-03-23 NOTE — ED Provider Notes (Signed)
[facility] EMERGENCY DEPARTMENT Provider Note   CSN: [id number] Arrival date & time: 03/23/17  1208     History   Chief Complaint Chief Complaint  Patient presents with  . Allergic Reaction    HPI Miranda Mack is a 22 y.o. female.  Patient is a 22 year old female with a history of chronic urticaria, GERD and ovarian cyst who is presenting today with swelling of her right upper and lower lip and a globus sensation in her throat that started around 1030 when she woke up this morning.  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  She did not eat any of those things but did because her boyfriend who was eating fish last night.  She denies any shortness of breath, tongue swelling and she is able to swallow.  She takes Benadryl often for her hives but has not noticed any hives today and has not taken anything prior to coming.  She denies any recent illness and has had no other new exposure   The history is provided by the patient.  Allergic Reaction  Presenting symptoms: difficulty swallowing and swelling   Severity:  Mild Duration:  3 hours Prior allergic episodes:  Food/nut allergies Context comment:  Kissed her boyfriend who was eating fish last night Relieved by:  None tried Worsened by:  Nothing Ineffective treatments:  None tried   Past Medical History:  Diagnosis Date  . GERD (gastroesophageal reflux disease)   . Ovarian cyst     There are no active problems to display for this patient.   History reviewed. No pertinent surgical history.  OB History    No data available       Home Medications    Prior to Admission medications   Medication Sig Start Date End Date Taking? Authorizing Provider  hydrOXYzine (ATARAX/VISTARIL) 25 MG tablet Take 1-2 tablets (25-50 mg total) by mouth every 6 (six) hours as needed for itching (may cause drowsiness). 02/27/17   Molpus, John, MD    ibuprofen (ADVIL,MOTRIN) 600 MG tablet Take 1 tablet (600 mg total) by mouth every 8 (eight) hours as needed. 09/17/14   Pricilla Loveless, MD    Family History No family history on file.  Social History Social History   Tobacco Use  . Smoking status: Never Smoker  . Smokeless tobacco: Never Used  Substance Use Topics  . Alcohol use: Yes  . Drug use: No     Allergies   Patient has no known allergies.   Review of Systems Review of Systems  HENT: Positive for trouble swallowing.   All other systems reviewed and are negative.    Physical Exam Updated Vital Signs BP 128/81 (BP Location: Left Arm)   Pulse 98   Temp 98.6 F (37 C) (Oral)   Resp 14   Ht 5\' 3"  (1.6 m)   Wt 122.5 kg (270 lb)   LMP 03/11/2017   SpO2 98%   BMI 47.83 kg/m   Physical Exam  Constitutional: She is oriented to person, place, and time. She appears well-developed and well-nourished. No distress.  HENT:  Head: Normocephalic and atraumatic.    Mouth/Throat: Oropharynx is clear and moist.  Eyes: Conjunctivae and EOM are normal. Pupils are equal, round, and reactive to light.  Neck: Normal range of motion. Neck supple.  Cardiovascular: Normal rate, regular rhythm and intact distal pulses.  No murmur heard. Pulmonary/Chest: Effort normal and breath  sounds normal. No respiratory distress. She has no wheezes. She has no rales.  Musculoskeletal: Normal range of motion. She exhibits no edema or tenderness.  Neurological: She is alert and oriented to person, place, and time.  Skin: Skin is warm and dry. No rash noted. No erythema.  Psychiatric: She has a normal mood and affect. Her behavior is normal.  Nursing note and vitals reviewed.    ED Treatments / Results  Labs (all labs ordered are listed, but only abnormal results are displayed) Labs Reviewed - No data to display  EKG  EKG Interpretation None       Radiology No results found.  Procedures Procedures (including critical care  time)  Medications Ordered in ED Medications  diphenhydrAMINE (BENADRYL) capsule 50 mg (50 mg Oral Given 03/23/17 1237)  predniSONE (DELTASONE) tablet 60 mg (60 mg Oral Given 03/23/17 1237)  famotidine (PEPCID) tablet 20 mg (20 mg Oral Given 03/23/17 1238)  loratadine (CLARITIN) tablet 10 mg (10 mg Oral Given 03/23/17 1238)     Initial Impression / Assessment and Plan / ED Course  I have reviewed the triage vital signs and the nursing notes.  Pertinent labs & imaging results that were available during my care of the patient were reviewed by me and considered in my medical decision making (see chart for details).     Patient presenting with symptoms consistent with an allergic reaction.  No definitive cause but may be from kissing her boyfriend who ate fish last night but she is known to be allergic to.  However patient does have chronic urticaria as well.  She is not having urticaria presently.  She is well-appearing and in no acute distress.  Patient given oral Benadryl, Pepcid, prednisone and Claritin.  Will reevaluate.  2:08 PM Pt feeling much better after meds.  Will d/c home.  Given a course of prednisone and daily Claritin to take.  She is already decided on an allergist she will follow-up with  Final Clinical Impressions(s) / ED Diagnoses   Final diagnoses:  Allergic reaction, initial encounter    ED Discharge Orders        Ordered    predniSONE (DELTASONE) 20 MG tablet  Daily     03/23/17 1408    loratadine (CLARITIN) 10 MG tablet  Daily     03/23/17 1408    EPINEPHrine (EPIPEN 2-PAK) 0.3 mg/0.3 mL IJ SOAJ injection   Once     03/23/17 1409       Gwyneth SproutPlunkett, Tearra Ouk, MD 03/23/17 1409

## 2017-03-23 NOTE — ED Notes (Signed)
ED Provider at bedside. 

## 2017-04-01 ENCOUNTER — Emergency Department (HOSPITAL_BASED_OUTPATIENT_CLINIC_OR_DEPARTMENT_OTHER): Payer: Self-pay

## 2017-04-01 ENCOUNTER — Other Ambulatory Visit: Payer: Self-pay

## 2017-04-01 ENCOUNTER — Observation Stay (HOSPITAL_BASED_OUTPATIENT_CLINIC_OR_DEPARTMENT_OTHER)
Admission: EM | Admit: 2017-04-01 | Discharge: 2017-04-03 | Disposition: A | Discharge status: Home / Self Care | Payer: Self-pay | Attending: General Surgery | Admitting: General Surgery

## 2017-04-01 ENCOUNTER — Encounter (HOSPITAL_BASED_OUTPATIENT_CLINIC_OR_DEPARTMENT_OTHER): Payer: Self-pay

## 2017-04-01 DIAGNOSIS — R071 Chest pain on breathing: Secondary | ICD-10-CM

## 2017-04-01 DIAGNOSIS — K5900 Constipation, unspecified: Secondary | ICD-10-CM | POA: Insufficient documentation

## 2017-04-01 DIAGNOSIS — K3580 Unspecified acute appendicitis: Principal | ICD-10-CM | POA: Insufficient documentation

## 2017-04-01 DIAGNOSIS — K37 Unspecified appendicitis: Secondary | ICD-10-CM | POA: Diagnosis present

## 2017-04-01 DIAGNOSIS — K219 Gastro-esophageal reflux disease without esophagitis: Secondary | ICD-10-CM | POA: Insufficient documentation

## 2017-04-01 HISTORY — DX: Unspecified appendicitis: K37

## 2017-04-01 LAB — COMPREHENSIVE METABOLIC PANEL
ALT: 16 U/L (ref 14–54)
AST: 16 U/L (ref 15–41)
Albumin: 3.9 g/dL (ref 3.5–5.0)
Alkaline Phosphatase: 77 U/L (ref 38–126)
Anion gap: 8 (ref 5–15)
BUN: 8 mg/dL (ref 6–20)
CHLORIDE: 105 mmol/L (ref 101–111)
CO2: 23 mmol/L (ref 22–32)
CREATININE: 0.76 mg/dL (ref 0.44–1.00)
Calcium: 9.2 mg/dL (ref 8.9–10.3)
Glucose, Bld: 92 mg/dL (ref 65–99)
Potassium: 3.6 mmol/L (ref 3.5–5.1)
Sodium: 136 mmol/L (ref 135–145)
TOTAL PROTEIN: 7.9 g/dL (ref 6.5–8.1)
Total Bilirubin: 0.5 mg/dL (ref 0.3–1.2)

## 2017-04-01 LAB — URINALYSIS, ROUTINE W REFLEX MICROSCOPIC
Bilirubin Urine: NEGATIVE
Glucose, UA: NEGATIVE mg/dL
KETONES UR: NEGATIVE mg/dL
LEUKOCYTES UA: NEGATIVE
NITRITE: NEGATIVE
PH: 6.5 (ref 5.0–8.0)
PROTEIN: NEGATIVE mg/dL
Specific Gravity, Urine: 1.025 (ref 1.005–1.030)

## 2017-04-01 LAB — URINALYSIS, MICROSCOPIC (REFLEX)

## 2017-04-01 LAB — CBC WITH DIFFERENTIAL/PLATELET
Basophils Absolute: 0 10*3/uL (ref 0.0–0.1)
Basophils Relative: 0 %
EOS PCT: 1 %
Eosinophils Absolute: 0.2 10*3/uL (ref 0.0–0.7)
HCT: 35.9 % — ABNORMAL LOW (ref 36.0–46.0)
Hemoglobin: 11.7 g/dL — ABNORMAL LOW (ref 12.0–15.0)
LYMPHS ABS: 2.8 10*3/uL (ref 0.7–4.0)
LYMPHS PCT: 18 %
MCH: 28 pg (ref 26.0–34.0)
MCHC: 32.6 g/dL (ref 30.0–36.0)
MCV: 85.9 fL (ref 78.0–100.0)
MONO ABS: 1.4 10*3/uL — AB (ref 0.1–1.0)
MONOS PCT: 9 %
Neutro Abs: 11 10*3/uL — ABNORMAL HIGH (ref 1.7–7.7)
Neutrophils Relative %: 72 %
PLATELETS: 391 10*3/uL (ref 150–400)
RBC: 4.18 MIL/uL (ref 3.87–5.11)
RDW: 15 % (ref 11.5–15.5)
WBC: 15.4 10*3/uL — ABNORMAL HIGH (ref 4.0–10.5)

## 2017-04-01 LAB — PREGNANCY, URINE: Preg Test, Ur: NEGATIVE

## 2017-04-01 LAB — LIPASE, BLOOD: LIPASE: 23 U/L (ref 11–51)

## 2017-04-01 MED ORDER — MORPHINE SULFATE (PF) 4 MG/ML IV SOLN
4.0000 mg | Freq: Once | INTRAVENOUS | Status: AC
  Administered 2017-04-01: 4 mg via INTRAVENOUS
  Filled 2017-04-01: qty 1

## 2017-04-01 MED ORDER — IOPAMIDOL (ISOVUE-300) INJECTION 61%
100.0000 mL | Freq: Once | INTRAVENOUS | Status: AC | PRN
  Administered 2017-04-01: 100 mL via INTRAVENOUS

## 2017-04-01 MED ORDER — ONDANSETRON HCL 4 MG/2ML IJ SOLN
4.0000 mg | Freq: Once | INTRAMUSCULAR | Status: AC
  Administered 2017-04-01: 4 mg via INTRAVENOUS
  Filled 2017-04-01: qty 2

## 2017-04-01 MED ORDER — SODIUM CHLORIDE 0.9 % IV BOLUS (SEPSIS)
1000.0000 mL | Freq: Once | INTRAVENOUS | Status: AC
  Administered 2017-04-01: 1000 mL via INTRAVENOUS

## 2017-04-01 MED ORDER — SODIUM CHLORIDE 0.9 % IV SOLN
2.0000 g | Freq: Once | INTRAVENOUS | Status: AC
  Administered 2017-04-01: 2 g via INTRAVENOUS
  Filled 2017-04-01: qty 20

## 2017-04-01 MED ORDER — METRONIDAZOLE IN NACL 5-0.79 MG/ML-% IV SOLN
500.0000 mg | Freq: Once | INTRAVENOUS | Status: AC
  Administered 2017-04-01: 500 mg via INTRAVENOUS
  Filled 2017-04-01: qty 100

## 2017-04-01 MED ORDER — HYDROMORPHONE HCL 1 MG/ML IJ SOLN
0.5000 mg | INTRAMUSCULAR | Status: DC | PRN
  Administered 2017-04-02: 0.5 mg via INTRAVENOUS
  Filled 2017-04-01: qty 1
  Filled 2017-04-01: qty 0.5

## 2017-04-01 NOTE — ED Triage Notes (Signed)
C/o abd pain day 3-NAD-steady gait

## 2017-04-01 NOTE — ED Notes (Signed)
[facility]  Boyfriend at bedside.

## 2017-04-01 NOTE — ED Notes (Signed)
ED Provider at bedside. 

## 2017-04-01 NOTE — ED Provider Notes (Signed)
[facility] EMERGENCY DEPARTMENT Provider Note   CSN: [id number] Arrival date & time: 04/01/17  1806     History   Chief Complaint Chief Complaint  Patient presents with  . Abdominal Pain    HPI Miranda Mack is a 22 y.o. female.  The history is provided by the patient and medical records. No language interpreter was used.  Abdominal Pain   This is a new problem. The current episode started more than 2 days ago. The problem occurs constantly. The problem has been gradually worsening. The pain is associated with an unknown factor. The pain is located in the RLQ. The quality of the pain is aching and dull. The pain is at a severity of 6/10. The pain is moderate. Associated symptoms include anorexia, nausea and vomiting. Pertinent negatives include fever, diarrhea, constipation, dysuria, frequency and headaches. The symptoms are aggravated by palpation and activity. Nothing relieves the symptoms. Her past medical history is significant for GERD.    Past Medical History:  Diagnosis Date  . GERD (gastroesophageal reflux disease)   . Ovarian cyst     There are no active problems to display for this patient.   History reviewed. No pertinent surgical history.  OB History    No data available       Home Medications    Prior to Admission medications   Not on File    Family History No family history on file.  Social History Social History   Tobacco Use  . Smoking status: Never Smoker  . Smokeless tobacco: Never Used  Substance Use Topics  . Alcohol use: Yes    Comment: occ  . Drug use: No     Allergies   Patient has no known allergies.   Review of Systems Review of Systems  Constitutional: Negative for chills, diaphoresis, fatigue and fever.  HENT: Negative for congestion.   Eyes: Negative for visual disturbance.  Respiratory: Negative for cough, chest tightness, shortness of breath and wheezing.   Cardiovascular: Negative for palpitations and  leg swelling.  Gastrointestinal: Positive for abdominal pain, anorexia, nausea and vomiting. Negative for abdominal distention, constipation and diarrhea.  Genitourinary: Negative for dysuria, flank pain, frequency, vaginal bleeding, vaginal discharge and vaginal pain.  Musculoskeletal: Negative for back pain, gait problem, neck pain and neck stiffness.  Skin: Negative for rash and wound.  Neurological: Negative for dizziness and headaches.  Psychiatric/Behavioral: Negative for agitation and confusion.     Physical Exam Updated Vital Signs BP 132/79 (BP Location: Left Arm)   Pulse (!) 114   Temp 98.9 F (37.2 C) (Oral)   Resp 20   Ht  (1.6 m)   Wt 124.3 kg (274 lb)   LMP 03/11/2017   SpO2 100%   BMI 48.54 kg/m   Physical Exam  Constitutional: She appears well-developed and well-nourished. She does not appear ill. No distress.  HENT:  Head: Normocephalic.  Mouth/Throat: Oropharynx is clear and moist. No oropharyngeal exudate.  Neck: Normal range of motion.  Cardiovascular: Intact distal pulses. Tachycardia present.  No murmur heard. Pulmonary/Chest: Effort normal and breath sounds normal. No stridor. No respiratory distress. She has no wheezes. She has no rales. She exhibits no tenderness.  Abdominal: Soft. Bowel sounds are normal. She exhibits no distension and no mass. There is tenderness. There is no rebound.  Musculoskeletal: She exhibits no edema or tenderness.  Neurological: She is alert. She displays normal reflexes. No sensory deficit.  Skin: Capillary refill takes less than 2 seconds. No  rash noted. She is not diaphoretic. No erythema.  Psychiatric: She has a normal mood and affect.  Nursing note and vitals reviewed.    ED Treatments / Results  Labs (all labs ordered are listed, but only abnormal results are displayed) Labs Reviewed  URINALYSIS, ROUTINE W REFLEX MICROSCOPIC - Abnormal; Notable for the following components:      Result Value   APPearance  HAZY (*)    Hgb urine dipstick TRACE (*)    All other components within normal limits  URINALYSIS, MICROSCOPIC (REFLEX) - Abnormal; Notable for the following components:   Bacteria, UA MANY (*)    Squamous Epithelial / LPF TOO NUMEROUS TO COUNT (*)    All other components within normal limits  CBC WITH DIFFERENTIAL/PLATELET - Abnormal; Notable for the following components:   WBC 15.4 (*)    Hemoglobin 11.7 (*)    HCT 35.9 (*)    Neutro Abs 11.0 (*)    Monocytes Absolute 1.4 (*)    All other components within normal limits  CULTURE, BLOOD (ROUTINE X 2)  CULTURE, BLOOD (ROUTINE X 2)  PREGNANCY, URINE  COMPREHENSIVE METABOLIC PANEL  LIPASE, BLOOD    EKG  EKG Interpretation None       Radiology Ct Abdomen Pelvis W Contrast  Result Date: 04/01/2017 CLINICAL DATA:  Right lower quadrant pain EXAM: CT ABDOMEN AND PELVIS WITH CONTRAST TECHNIQUE: Multidetector CT imaging of the abdomen and pelvis was performed using the standard protocol following bolus administration of intravenous contrast. CONTRAST:  ISOVUE-300 IOPAMIDOL (ISOVUE-300) INJECTION 61% COMPARISON:  CT 05/17/2013 FINDINGS: Lower chest: Lung bases demonstrate no acute consolidation or effusion. Normal heart size. Hepatobiliary: No focal liver abnormality is seen. No gallstones, gallbladder wall thickening, or biliary dilatation. Pancreas: Unremarkable. No pancreatic ductal dilatation or surrounding inflammatory changes. Spleen: Normal in size without focal abnormality. Adrenals/Urinary Tract: Adrenal glands are unremarkable. Kidneys are normal, without renal calculi, focal lesion, or hydronephrosis. Bladder is unremarkable. Stomach/Bowel: Stomach is nonenlarged. No dilated small bowel. Abnormal appendix in the right lower quadrant, measuring up to 1 cm in diameter. Moderate periappendiceal soft tissue stranding. No extraluminal gas. Vascular/Lymphatic: Nonaneurysmal aorta. Multiple right lower quadrant subcentimeter mesenteric  lymph nodes measuring up to 8 mm in size. Reproductive: Uterus unremarkable. Possible rim enhancing lesion in the right ovary. Other: Small free fluid in the colon.  No free air. Musculoskeletal: No acute or significant osseous findings. IMPRESSION: 1. Findings consistent with acute appendicitis. Appendix: Location: Right lower quadrant Diameter: 10 mm Appendicolith: None Mucosal hyper-enhancement: Mild mucosal enhancement present. Extraluminal gas: Not seen Periappendiceal collection: Not seen 2.   Trace free fluid in the pelvis 3. Possible rim enhancing right adnexal lesion, suggest correlation with nonemergent pelvic ultrasound. Electronically Signed   By: Jasmine Pang M.D.   On: 04/01/2017 21:51    Procedures Procedures (including critical care time)   Medications Ordered in ED Medications  cefTRIAXone (ROCEPHIN) 2 g in sodium chloride 0.9 % 100 mL IVPB (not administered)    And  metroNIDAZOLE (FLAGYL) IVPB 500 mg (not administered)  morphine 4 MG/ML injection 4 mg (not administered)  sodium chloride 0.9 % bolus 1,000 mL (not administered)  HYDROmorphone (DILAUDID) injection 0.5 mg (not administered)  morphine 4 MG/ML injection 4 mg (4 mg Intravenous Given 04/01/17 1928)  sodium chloride 0.9 % bolus 1,000 mL (0 mLs Intravenous Stopped 04/01/17 2031)  ondansetron (ZOFRAN) injection 4 mg (4 mg Intravenous Given 04/01/17 1928)  iopamidol (ISOVUE-300) 61 % injection 100 mL (100 mLs Intravenous  Contrast Given 04/01/17 2126)     Initial Impression / Assessment and Plan / ED Course  I have reviewed the triage vital signs and the nursing notes.  Pertinent labs & imaging results that were available during my care of the patient were reviewed by me and considered in my medical decision making (see chart for details).     Miranda Mack is a 22 y.o. female with a past medical history significant for ovarian cyst, GERD, and a reportedly "large appendix" who presents with right lower quadrant  abdominal pain and nausea.  Patient reports that she has had abdominal pain for the last 3 days.  She reports that Sunday night she woke up and had severe pain in the right lower quadrant.  She reports that it has been constant with waves of worsening symptoms.  She describes nausea but no vomiting.  She reports no appetite.  She denies fevers or chills.  She denies any constipation, diarrhea, or blood in her bowel movement.  She says that she took stool softener and had multiple bowel movement without change in her symptoms.  She denies any URI symptoms.  She denies trauma.  She reports that she is concerned her appendix may be the cause as she was told years ago that she had an "large appendix" she says that it was not removed at that time.  She currently describes her pain as moderate when she is resting however when she tries to ambulate or move it worsens.  She denies any vaginal bleeding or vaginal discharge.  She says this feels different than previous ovarian pain.  On exam, patient does have pain in the right lower quadrant.  No other abdominal pain when palpated.  Patient had no CVA tenderness.  Patient's right lower quadrant worsened in pain when her right leg was manipulated.  Lungs clear and chest nontender.  Exam otherwise unremarkable.  Given the patient's report of appendix abnormalities and the location of her discomfort and other symptoms I am concerned about her appendix.  Patient was given fluids, nausea medicine, and pain medicine during initial workup.  Patient will have lab testing and a CT will be ordered to further evaluate her appendix.  If workup simply unremarkable, will consider pelvic exam and ultrasound to look at her ovaries.  Anticipate reassessment after workup.  Patient found to have a leukocytosis of 15.4.  Lipase not elevated.  CMP reassuring.  CT scan shows evidence of acute appendicitis.  No evidence of perforation or abscess.  There is also trace free fluid.  Patient  will be given antibiotics and blood cultures obtained.  Patient will continue n.p.o. status.  Fluids will be continued.  General surgery will be called.  Anticipate transfer for further management.  General surgery requested for maintaining n.p.o. status and transfer to be admitted at [facility].  Bed request was placed and patient will be admitted.   Final Clinical Impressions(s) / ED Diagnoses   Final diagnoses:  Acute appendicitis, unspecified acute appendicitis type    Clinical Impression: 1. Acute appendicitis, unspecified acute appendicitis type     Disposition: Admit  This note was prepared with assistance of Dragon voice recognition software. Occasional wrong-word or sound-a-like substitutions may have occurred due to the inherent limitations of voice recognition software.     Tegeler, Canary Brim, MD 04/02/17 [phone]

## 2017-04-02 ENCOUNTER — Encounter (HOSPITAL_COMMUNITY): Payer: Self-pay | Admitting: Certified Registered Nurse Anesthetist

## 2017-04-02 ENCOUNTER — Encounter (HOSPITAL_COMMUNITY)
Admission: EM | Disposition: A | Discharge status: Home / Self Care | Payer: Self-pay | Source: Home / Self Care | Attending: Emergency Medicine

## 2017-04-02 ENCOUNTER — Other Ambulatory Visit: Payer: Self-pay

## 2017-04-02 ENCOUNTER — Observation Stay (HOSPITAL_COMMUNITY): Payer: Self-pay | Admitting: Anesthesiology

## 2017-04-02 HISTORY — PX: LAPAROSCOPIC APPENDECTOMY: SHX408

## 2017-04-02 LAB — MRSA PCR SCREENING: MRSA by PCR: NEGATIVE

## 2017-04-02 SURGERY — APPENDECTOMY, LAPAROSCOPIC
Anesthesia: General | Site: Abdomen

## 2017-04-02 MED ORDER — ACETAMINOPHEN 650 MG RE SUPP: 650.0000 mg | Freq: Four times a day (QID) | RECTAL | Status: DC | PRN

## 2017-04-02 MED ORDER — SUGAMMADEX SODIUM 500 MG/5ML IV SOLN
INTRAVENOUS | Status: DC | PRN
  Administered 2017-04-02: 200 mg via INTRAVENOUS

## 2017-04-02 MED ORDER — LACTATED RINGERS IR SOLN
Status: DC | PRN
  Administered 2017-04-02: 1000 mL

## 2017-04-02 MED ORDER — ROCURONIUM BROMIDE 100 MG/10ML IV SOLN
INTRAVENOUS | Status: DC | PRN
  Administered 2017-04-02: 20 mg via INTRAVENOUS

## 2017-04-02 MED ORDER — ACETAMINOPHEN 500 MG PO TABS
1000.0000 mg | ORAL_TABLET | ORAL | Status: AC
  Administered 2017-04-02: 1000 mg via ORAL
  Filled 2017-04-02: qty 2

## 2017-04-02 MED ORDER — METRONIDAZOLE IN NACL 5-0.79 MG/ML-% IV SOLN
500.0000 mg | Freq: Three times a day (TID) | INTRAVENOUS | Status: DC
  Administered 2017-04-02: 500 mg via INTRAVENOUS
  Filled 2017-04-02: qty 100

## 2017-04-02 MED ORDER — ONDANSETRON 4 MG PO TBDP: 4.0000 mg | ORAL_TABLET | Freq: Four times a day (QID) | ORAL | Status: DC | PRN

## 2017-04-02 MED ORDER — FENTANYL CITRATE (PF) 100 MCG/2ML IJ SOLN
INTRAMUSCULAR | Status: DC | PRN
  Administered 2017-04-02: 100 ug via INTRAVENOUS

## 2017-04-02 MED ORDER — DEXTROSE-NACL 5-0.9 % IV SOLN
INTRAVENOUS | Status: DC
  Administered 2017-04-02: 07:00:00 via INTRAVENOUS

## 2017-04-02 MED ORDER — MIDAZOLAM HCL 5 MG/5ML IJ SOLN
INTRAMUSCULAR | Status: DC | PRN
  Administered 2017-04-02 (×2): 1 mg via INTRAVENOUS

## 2017-04-02 MED ORDER — METRONIDAZOLE IN NACL 5-0.79 MG/ML-% IV SOLN
500.0000 mg | Freq: Three times a day (TID) | INTRAVENOUS | Status: DC
  Administered 2017-04-02 – 2017-04-03 (×3): 500 mg via INTRAVENOUS
  Filled 2017-04-02 (×3): qty 100

## 2017-04-02 MED ORDER — ACETAMINOPHEN 325 MG PO TABS: 650.0000 mg | ORAL_TABLET | Freq: Four times a day (QID) | ORAL | Status: DC | PRN

## 2017-04-02 MED ORDER — GLYCOPYRROLATE 0.2 MG/ML IJ SOLN
INTRAMUSCULAR | Status: DC | PRN
  Administered 2017-04-02: .1 mg via INTRAVENOUS

## 2017-04-02 MED ORDER — FENTANYL CITRATE (PF) 100 MCG/2ML IJ SOLN
INTRAMUSCULAR | Status: AC
  Filled 2017-04-02: qty 2

## 2017-04-02 MED ORDER — LIDOCAINE HCL (CARDIAC) 20 MG/ML IV SOLN
INTRAVENOUS | Status: DC | PRN
  Administered 2017-04-02: 50 mg via INTRAVENOUS

## 2017-04-02 MED ORDER — PROPOFOL 10 MG/ML IV BOLUS
INTRAVENOUS | Status: AC
  Filled 2017-04-02: qty 20

## 2017-04-02 MED ORDER — KCL IN DEXTROSE-NACL 20-5-0.45 MEQ/L-%-% IV SOLN
INTRAVENOUS | Status: DC
  Administered 2017-04-02: 15:00:00 via INTRAVENOUS
  Filled 2017-04-02 (×2): qty 1000

## 2017-04-02 MED ORDER — SODIUM CHLORIDE 0.9 % IV SOLN
2.0000 g | INTRAVENOUS | Status: DC
  Administered 2017-04-02: 2 g via INTRAVENOUS
  Filled 2017-04-02: qty 2
  Filled 2017-04-02: qty 20

## 2017-04-02 MED ORDER — ACETAMINOPHEN 10 MG/ML IV SOLN
INTRAVENOUS | Status: AC
  Filled 2017-04-02: qty 100

## 2017-04-02 MED ORDER — CHLORHEXIDINE GLUCONATE CLOTH 2 % EX PADS
6.0000 | MEDICATED_PAD | Freq: Every day | CUTANEOUS | Status: DC
  Administered 2017-04-02: 6 via TOPICAL

## 2017-04-02 MED ORDER — CELECOXIB 200 MG PO CAPS
400.0000 mg | ORAL_CAPSULE | ORAL | Status: AC
  Administered 2017-04-02: 400 mg via ORAL
  Filled 2017-04-02: qty 2

## 2017-04-02 MED ORDER — GABAPENTIN 300 MG PO CAPS
300.0000 mg | ORAL_CAPSULE | ORAL | Status: AC
  Administered 2017-04-02: 300 mg via ORAL
  Filled 2017-04-02: qty 1

## 2017-04-02 MED ORDER — HYDROMORPHONE HCL 1 MG/ML IJ SOLN: 1.0000 mg | INTRAMUSCULAR | Status: DC | PRN

## 2017-04-02 MED ORDER — LACTATED RINGERS IV SOLN
INTRAVENOUS | Status: DC
  Administered 2017-04-02: 1000 mL via INTRAVENOUS

## 2017-04-02 MED ORDER — MIDAZOLAM HCL 2 MG/2ML IJ SOLN
INTRAMUSCULAR | Status: AC
  Filled 2017-04-02: qty 2

## 2017-04-02 MED ORDER — TRAMADOL HCL 50 MG PO TABS: 50.0000 mg | ORAL_TABLET | Freq: Four times a day (QID) | ORAL | Status: DC | PRN

## 2017-04-02 MED ORDER — SUCCINYLCHOLINE CHLORIDE 20 MG/ML IJ SOLN
INTRAMUSCULAR | Status: DC | PRN
  Administered 2017-04-02: 140 mg via INTRAVENOUS

## 2017-04-02 MED ORDER — LACTATED RINGERS IV SOLN
INTRAVENOUS | Status: DC | PRN
  Administered 2017-04-02 (×2): via INTRAVENOUS

## 2017-04-02 MED ORDER — FENTANYL CITRATE (PF) 100 MCG/2ML IJ SOLN
25.0000 ug | INTRAMUSCULAR | Status: DC | PRN
  Administered 2017-04-02: 50 ug via INTRAVENOUS

## 2017-04-02 MED ORDER — DEXAMETHASONE SODIUM PHOSPHATE 10 MG/ML IJ SOLN
INTRAMUSCULAR | Status: AC
  Filled 2017-04-02: qty 1

## 2017-04-02 MED ORDER — ONDANSETRON HCL 4 MG/2ML IJ SOLN
4.0000 mg | Freq: Four times a day (QID) | INTRAMUSCULAR | Status: DC | PRN
Start: 2017-04-02 — End: 2017-04-03

## 2017-04-02 MED ORDER — BUPIVACAINE-EPINEPHRINE (PF) 0.5% -1:200000 IJ SOLN
INTRAMUSCULAR | Status: AC
  Filled 2017-04-02: qty 30

## 2017-04-02 MED ORDER — PROPOFOL 10 MG/ML IV BOLUS
INTRAVENOUS | Status: DC | PRN
  Administered 2017-04-02: 200 mg via INTRAVENOUS

## 2017-04-02 MED ORDER — SUGAMMADEX SODIUM 200 MG/2ML IV SOLN
INTRAVENOUS | Status: AC
  Filled 2017-04-02: qty 2

## 2017-04-02 MED ORDER — DEXAMETHASONE SODIUM PHOSPHATE 10 MG/ML IJ SOLN
INTRAMUSCULAR | Status: DC | PRN
  Administered 2017-04-02: 10 mg via INTRAVENOUS

## 2017-04-02 MED ORDER — 0.9 % SODIUM CHLORIDE (POUR BTL) OPTIME
TOPICAL | Status: DC | PRN
  Administered 2017-04-02: 1000 mL

## 2017-04-02 MED ORDER — ONDANSETRON HCL 4 MG/2ML IJ SOLN
INTRAMUSCULAR | Status: DC | PRN
  Administered 2017-04-02 (×2): 4 mg via INTRAVENOUS

## 2017-04-02 MED ORDER — BUPIVACAINE-EPINEPHRINE 0.5% -1:200000 IJ SOLN
INTRAMUSCULAR | Status: DC | PRN
  Administered 2017-04-02: 20 mL

## 2017-04-02 MED ORDER — HYDROCODONE-ACETAMINOPHEN 5-325 MG PO TABS
1.0000 | ORAL_TABLET | ORAL | Status: DC | PRN
  Administered 2017-04-02 – 2017-04-03 (×2): 2 via ORAL
  Filled 2017-04-02 (×2): qty 2

## 2017-04-02 SURGICAL SUPPLY — 32 items
APPLIER CLIP ROT 10 11.4 M/L (STAPLE)
CHLORAPREP W/TINT 26ML (MISCELLANEOUS) ×2 IMPLANT
CLIP APPLIE ROT 10 11.4 M/L (STAPLE) IMPLANT
COVER SURGICAL LIGHT HANDLE (MISCELLANEOUS) ×2 IMPLANT
CUTTER FLEX LINEAR 45M (STAPLE) ×2 IMPLANT
DECANTER SPIKE VIAL GLASS SM (MISCELLANEOUS) IMPLANT
DRAPE LAPAROSCOPIC ABDOMINAL (DRAPES) IMPLANT
ELECT REM PT RETURN 15FT ADLT (MISCELLANEOUS) ×2 IMPLANT
ENDOLOOP SUT PDS II  0 18 (SUTURE)
ENDOLOOP SUT PDS II 0 18 (SUTURE) IMPLANT
GAUZE SPONGE 2X2 8PLY STRL LF (GAUZE/BANDAGES/DRESSINGS) ×1 IMPLANT
GLOVE SURG ORTHO 8.0 STRL STRW (GLOVE) ×2 IMPLANT
GOWN STRL REUS W/TWL XL LVL3 (GOWN DISPOSABLE) ×4 IMPLANT
HOVERMATT SINGLE USE (MISCELLANEOUS) ×2 IMPLANT
KIT BASIN OR (CUSTOM PROCEDURE TRAY) ×2 IMPLANT
POUCH SPECIMEN RETRIEVAL 10MM (ENDOMECHANICALS) ×2 IMPLANT
RELOAD 45 VASCULAR/THIN (ENDOMECHANICALS) IMPLANT
RELOAD STAPLE TA45 3.5 REG BLU (ENDOMECHANICALS) ×2 IMPLANT
SET IRRIG TUBING LAPAROSCOPIC (IRRIGATION / IRRIGATOR) ×2 IMPLANT
SHEARS HARMONIC ACE PLUS 36CM (ENDOMECHANICALS) ×2 IMPLANT
SPONGE GAUZE 2X2 STER 10/PKG (GAUZE/BANDAGES/DRESSINGS) ×1
STRIP CLOSURE SKIN 1/2X4 (GAUZE/BANDAGES/DRESSINGS) ×2 IMPLANT
SUT MNCRL AB 4-0 PS2 18 (SUTURE) ×2 IMPLANT
TAPE CLOTH SURG 4X10 WHT LF (GAUZE/BANDAGES/DRESSINGS) ×2 IMPLANT
TOWEL OR 17X26 10 PK STRL BLUE (TOWEL DISPOSABLE) ×2 IMPLANT
TOWEL OR NON WOVEN STRL DISP B (DISPOSABLE) ×2 IMPLANT
TRAY FOLEY W/METER SILVER 14FR (SET/KITS/TRAYS/PACK) ×2 IMPLANT
TRAY FOLEY W/METER SILVER 16FR (SET/KITS/TRAYS/PACK) IMPLANT
TRAY LAPAROSCOPIC (CUSTOM PROCEDURE TRAY) ×2 IMPLANT
TROCAR BLADELESS OPT 5 100 (ENDOMECHANICALS) ×2 IMPLANT
TROCAR XCEL BLUNT TIP 100MML (ENDOMECHANICALS) ×2 IMPLANT
TROCAR XCEL NON-BLD 11X100MML (ENDOMECHANICALS) ×2 IMPLANT

## 2017-04-02 NOTE — Op Note (Signed)
OPERATIVE REPORT - LAPAROSCOPIC APPENDECTOMY  Preop diagnosis:  Acute appendicitis  Postop diagnosis:  same  Procedure:  Laparoscopic appendectomy  Surgeon:  Darnell Levelodd Burrel Legrand, MD  Anesthesia:  general endotracheal  Estimated blood loss:  minimal  Preparation:  Chlora-prep  Complications:  none  Indications:  Patient is a 22 year old female with a 2-day history of abdominal pain that initiated the right lower quadrant.  Patient states that the pain continued thereafter.  She states that she had some constipation requiring stool softener.  She states that after a bowel movement the pain continued.  Secondary to continued pain she presented to the ER for further evaluation.  Upon evaluation in the emergency room and CT scan and laboratory studies.  Her CT scan was consistent with acute appendicitis.  I did review the CT scan personally.  She is WBC count was elevated at 15.4.  [facility] for further evaluation and management.  Procedure:  Patient is brought to the operating room and placed in a supine position on the operating room table. Following administration of general anesthesia, a time out was held and the patient's name and procedure is confirmed. Patient is then prepped and draped in the usual strict aseptic fashion.  After ascertaining that an adequate level of anesthesia has been achieved, a peri-umbilical incision is made with a #15 blade. Dissection is carried down to the fascia. Fascia is incised in the midline and the peritoneal cavity is entered cautiously. A #0-vicryl pursestring suture is placed in the fascia. An Hassan cannula is introduced under direct vision and secured with the pursestring suture. The abdomen is insufflated with carbon dioxide. The laparoscope is introduced and the abdomen is explored. Operative ports are placed in the right upper quadrant and left lower quadrant. The appendix is identified. The mesoappendix is divided with  the harmonic scalpel. Dissection is carried down to the base of the appendix. The base of the appendix is dissected out clearing the junction with the cecal wall. Using an Endo-GIA stapler, the base of the appendix is transected at the junction with the cecal wall. There is good approximation of tissue along the staple line. There is good hemostasis along the staple line. The appendix is placed into an endo-catch bag and withdrawn through the umbilical port. The #0-vicryl pursestring suture is tied securely.  Right lower quadrant is irrigated with warm saline which is evacuated. Good hemostasis is noted. Ports are removed under direct vision. Good hemostasis is noted at the port sites. Pneumoperitoneum is released.  Skin incisions are anesthetized with local anesthetic. Wounds are closed with interrupted 4-0 Monocryl subcuticular sutures. Wounds are washed and dried and Steri-Strips are applied. Dressings are applied. The patient is awakened from anesthesia and brought to the recovery room. The patient tolerated the procedure well.  Darnell Levelodd Malka Bocek, MD [facility] Office: [phone]

## 2017-04-02 NOTE — Transfer of Care (Signed)
Immediate Anesthesia Transfer of Care Note  Patient: Miranda Mack  Procedure(s) Performed: APPENDECTOMY LAPAROSCOPIC (N/A Abdomen)  Patient Location: PACU  Anesthesia Type:General  Level of Consciousness: awake, oriented, drowsy, patient cooperative and responds to stimulation  Airway & Oxygen Therapy: Patient Spontanous Breathing and Patient connected to face mask oxygen  Post-op Assessment: Report given to RN, Post -op Vital signs reviewed and stable and Patient moving all extremities  Post vital signs: Reviewed and stable  Last Vitals:  Vitals:   04/01/17 2356 04/02/17 0506  BP: 127/71 (!) 105/47  Pulse: 96 82  Resp:  12  Temp: 36.8 C (!) 36.3 C  SpO2: 96% 98%    Last Pain:  Vitals:   04/02/17 0506  TempSrc: Oral  PainSc: 5       Patients Stated Pain Goal: 5 (04/02/17 0506)  Complications: No apparent anesthesia complications

## 2017-04-02 NOTE — Anesthesia Preprocedure Evaluation (Addendum)
Anesthesia Evaluation  Patient identified by MRN, date of birth, ID band Patient awake    Reviewed: Allergy & Precautions, H&P , Patient's Chart, lab work & pertinent test results, reviewed documented beta blocker date and time   Airway Mallampati: II  TM Distance: >3 FB Neck ROM: full    Dental no notable dental hx.    Pulmonary    Pulmonary exam normal breath sounds clear to auscultation       Cardiovascular  Rhythm:regular Rate:Normal     Neuro/Psych    GI/Hepatic   Endo/Other  Morbid obesity  Renal/GU      Musculoskeletal   Abdominal   Peds  Hematology   Anesthesia Other Findings   Reproductive/Obstetrics                             Anesthesia Physical Anesthesia Plan  ASA: II and emergent  Anesthesia Plan: General   Post-op Pain Management:    Induction: Intravenous and Cricoid pressure planned  PONV Risk Score and Plan: 2 and Dexamethasone, Ondansetron and Treatment may vary due to age or medical condition  Airway Management Planned: Oral ETT  Additional Equipment:   Intra-op Plan:   Post-operative Plan: Extubation in OR  Informed Consent: I have reviewed the patients History and Physical, chart, labs and discussed the procedure including the risks, benefits and alternatives for the proposed anesthesia with the patient or authorized representative who has indicated his/her understanding and acceptance.   Dental Advisory Given  Plan Discussed with: CRNA and Surgeon  Anesthesia Plan Comments: (  )       Anesthesia Quick Evaluation

## 2017-04-02 NOTE — Progress Notes (Signed)
Nutrition Brief Note  Patient identified on the Malnutrition Screening Tool (MST) Report.  Pt with PMH of GERD presents with acute appendicitis.   Wt Readings from Last 15 Encounters:  04/01/17 278 lb 10.6 oz (126.4 kg)  03/23/17 270 lb (122.5 kg)  02/27/17 270 lb (122.5 kg)  06/02/16 271 lb 3.2 oz (123 kg)  05/09/16 230 lb (104.3 kg)  10/22/14 230 lb (104.3 kg)  09/17/14 230 lb (104.3 kg)  05/17/13 220 lb (99.8 kg)  02/11/13 200 lb (90.7 kg) (97 %, Z= 1.94)*  01/11/13 200 lb (90.7 kg) (97 %, Z= 1.94)*   * Growth percentiles are based on CDC (Girls, 2-20 Years) data.   Body mass index is 49.36 kg/m. Patient meets criteria for morbid obesity based on current BMI. Per weight readings, wt trending upwards.  Current diet order is NPO. Pt awaiting appendectomy by Dr. Gerrit FriendsGerkin. Suspect intake will return once diet advances.   Labs and medications reviewed.   No nutrition interventions warranted at this time. If nutrition issues arise, please consult RD.   Fransisca KaufmannAllison Ioannides, MS, RDN, LDN 04/02/2017 9:26 AM

## 2017-04-02 NOTE — H&P (Signed)
Miranda Mack is an 21 y.o. female.   Chief Complaint: Abdominal pain HPI: Patient is a 21-year-old female with a 2-day history of abdominal pain that initiated the right lower quadrant.  Patient states that the pain continued thereafter.  She states that she had some constipation requiring stool softener.  She states that after a bowel movement the pain continued.  Secondary to continued pain she presented to the ER for further evaluation.  Upon evaluation in the emergency room and CT scan and laboratory studies.  Her CT scan was consistent with acute appendicitis.  I did review the CT scan personally.  She is WBC count was elevated at 15.4.  Patient was transferred to [facility] for further evaluation and management.  Past Medical History:  Diagnosis Date  . GERD (gastroesophageal reflux disease)   . Ovarian cyst     History reviewed. No pertinent surgical history.  No family history on file. Social History:  reports that  has never smoked. she has never used smokeless tobacco. She reports that she drinks alcohol. She reports that she does not use drugs.  Allergies: No Known Allergies  Medications Prior to Admission  Medication Sig Dispense Refill  . diphenhydrAMINE (BENADRYL) 25 mg capsule Take 25 mg by mouth every 6 (six) hours as needed for allergies.    . Multiple Vitamin (MULTIVITAMIN WITH MINERALS) TABS tablet Take 1 tablet by mouth daily.      Results for orders placed or performed during the hospital encounter of 04/01/17 (from the past 48 hour(s))  Urinalysis, Routine w reflex microscopic     Status: Abnormal   Collection Time: 04/01/17  6:14 PM  Result Value Ref Range   Color, Urine YELLOW YELLOW   APPearance HAZY (A) CLEAR   Specific Gravity, Urine 1.025 1.005 - 1.030   pH 6.5 5.0 - 8.0   Glucose, UA NEGATIVE NEGATIVE mg/dL   Hgb urine dipstick TRACE (A) NEGATIVE   Bilirubin Urine NEGATIVE NEGATIVE   Ketones, ur NEGATIVE NEGATIVE mg/dL   Protein, ur  NEGATIVE NEGATIVE mg/dL   Nitrite NEGATIVE NEGATIVE   Leukocytes, UA NEGATIVE NEGATIVE    Comment: Performed at [facility] [place], [address] [place], [place] [address]  Pregnancy, urine     Status: None   Collection Time: 04/01/17  6:14 PM  Result Value Ref Range   Preg Test, Ur NEGATIVE NEGATIVE    Comment:        THE SENSITIVITY OF THIS METHODOLOGY IS >20 mIU/mL. Performed at [facility] [place], [address] [place], [place] [address]   Urinalysis, Microscopic (reflex)     Status: Abnormal   Collection Time: 04/01/17  6:14 PM  Result Value Ref Range   RBC / HPF 0-5 0 - 5 RBC/hpf   WBC, UA 0-5 0 - 5 WBC/hpf   Bacteria, UA MANY (A) NONE SEEN   Squamous Epithelial / LPF TOO NUMEROUS TO COUNT (A) NONE SEEN   Mucus PRESENT     Comment: Performed at [facility] [place], [address] [place], [place] [address]  CBC with Differential     Status: Abnormal   Collection Time: 04/01/17  7:24 PM  Result Value Ref Range   WBC 15.4 (H) 4.0 - 10.5 K/uL   RBC 4.18 3.87 - 5.11 MIL/uL   Hemoglobin 11.7 (L) 12.0 - 15.0 g/dL   HCT 35.9 (L) 36.0 - 46.0 %   MCV 85.9 78.0 - 100.0 fL   MCH   28.0 26.0 - 34.0 pg   MCHC 32.6 30.0 - 36.0 g/dL   RDW 15.0 11.5 - 15.5 %   Platelets 391 150 - 400 K/uL   Neutrophils Relative % 72 %   Neutro Abs 11.0 (H) 1.7 - 7.7 K/uL   Lymphocytes Relative 18 %   Lymphs Abs 2.8 0.7 - 4.0 K/uL   Monocytes Relative 9 %   Monocytes Absolute 1.4 (H) 0.1 - 1.0 K/uL   Eosinophils Relative 1 %   Eosinophils Absolute 0.2 0.0 - 0.7 K/uL   Basophils Relative 0 %   Basophils Absolute 0.0 0.0 - 0.1 K/uL    Comment: Performed at [facility] [place], [address] [place], [place] [address]  Comprehensive metabolic panel     Status: None   Collection Time: 04/01/17  7:24 PM  Result Value Ref Range   Sodium 136 135 - 145 mmol/L   Potassium 3.6 3.5 - 5.1 mmol/L   Chloride 105 101 - 111 mmol/L   CO2 23 22 - 32 mmol/L   Glucose, Bld 92 65 - 99  mg/dL   BUN 8 6 - 20 mg/dL   Creatinine, Ser 0.76 0.44 - 1.00 mg/dL   Calcium 9.2 8.9 - 10.3 mg/dL   Total Protein 7.9 6.5 - 8.1 g/dL   Albumin 3.9 3.5 - 5.0 g/dL   AST 16 15 - 41 U/L   ALT 16 14 - 54 U/L   Alkaline Phosphatase 77 38 - 126 U/L   Total Bilirubin 0.5 0.3 - 1.2 mg/dL   GFR calc non Af Amer >60 >60 mL/min   GFR calc Af Amer >60 >60 mL/min    Comment: (NOTE) The eGFR has been calculated using the CKD EPI equation. This calculation has not been validated in all clinical situations. eGFR's persistently <60 mL/min signify possible Chronic Kidney Disease.    Anion gap 8 5 - 15    Comment: Performed at [facility] [place], [address] [place], [place] [address]  Lipase, blood     Status: None   Collection Time: 04/01/17  7:24 PM  Result Value Ref Range   Lipase 23 11 - 51 U/L    Comment: Performed at [facility] [place], [address] [place], [place] [address]   Ct Abdomen Pelvis W Contrast  Result Date: 04/01/2017 CLINICAL DATA:  Right lower quadrant pain EXAM: CT ABDOMEN AND PELVIS WITH CONTRAST TECHNIQUE: Multidetector CT imaging of the abdomen and pelvis was performed using the standard protocol following bolus administration of intravenous contrast. CONTRAST:  100mL ISOVUE-300 IOPAMIDOL (ISOVUE-300) INJECTION 61% COMPARISON:  CT 05/17/2013 FINDINGS: Lower chest: Lung bases demonstrate no acute consolidation or effusion. Normal heart size. Hepatobiliary: No focal liver abnormality is seen. No gallstones, gallbladder wall thickening, or biliary dilatation. Pancreas: Unremarkable. No pancreatic ductal dilatation or surrounding inflammatory changes. Spleen: Normal in size without focal abnormality. Adrenals/Urinary Tract: Adrenal glands are unremarkable. Kidneys are normal, without renal calculi, focal lesion, or hydronephrosis. Bladder is unremarkable. Stomach/Bowel: Stomach is nonenlarged. No dilated small bowel. Abnormal appendix in the right lower quadrant,  measuring up to 1 cm in diameter. Moderate periappendiceal soft tissue stranding. No extraluminal gas. Vascular/Lymphatic: Nonaneurysmal aorta. Multiple right lower quadrant subcentimeter mesenteric lymph nodes measuring up to 8 mm in size. Reproductive: Uterus unremarkable. Possible rim enhancing lesion in the right ovary. Other: Small free fluid in the colon.  No free air. Musculoskeletal: No acute or significant osseous findings. IMPRESSION: 1. Findings consistent with   acute appendicitis. Appendix: Location: Right lower quadrant Diameter: 10 mm Appendicolith: None Mucosal hyper-enhancement: Mild mucosal enhancement present. Extraluminal gas: Not seen Periappendiceal collection: Not seen 2.   Trace free fluid in the pelvis 3. Possible rim enhancing right adnexal lesion, suggest correlation with nonemergent pelvic ultrasound. Electronically Signed   By: Kim  Fujinaga M.D.   On: 04/01/2017 21:51    Review of Systems  Constitutional: Negative for chills, fever and malaise/fatigue.  HENT: Negative for ear discharge, hearing loss and sore throat.   Eyes: Negative for blurred vision and discharge.  Respiratory: Negative for cough and shortness of breath.   Cardiovascular: Negative for chest pain, orthopnea and leg swelling.  Gastrointestinal: Positive for abdominal pain and nausea. Negative for constipation, diarrhea, heartburn and vomiting.  Musculoskeletal: Negative for myalgias and neck pain.  Skin: Negative for itching and rash.  Neurological: Negative for dizziness, focal weakness, seizures and loss of consciousness.  Endo/Heme/Allergies: Negative for environmental allergies. Does not bruise/bleed easily.  Psychiatric/Behavioral: Negative for depression and suicidal ideas.  All other systems reviewed and are negative.   Blood pressure (!) 105/47, pulse 82, temperature (!) 97.4 F (36.3 C), temperature source Oral, resp. rate 12, height 5' 3" (1.6 m), weight 126.4 kg (278 lb 10.6 oz), last  menstrual period 03/11/2017, SpO2 98 %. Physical Exam  Constitutional: She is oriented to person, place, and time. Vital signs are normal. She appears well-developed and well-nourished.  Conversant No acute distress  Eyes: Lids are normal. No scleral icterus.  No lid lag Moist conjunctiva  Neck: No tracheal tenderness present. No thyromegaly present.  No cervical lymphadenopathy  Cardiovascular: Normal rate, regular rhythm and intact distal pulses.  No murmur heard. Respiratory: Effort normal and breath sounds normal. She has no wheezes. She has no rales.  GI: Soft. Bowel sounds are normal. There is no hepatosplenomegaly. There is tenderness. There is tenderness at McBurney's point. There is no rebound and no guarding. No hernia.  Neurological: She is alert and oriented to person, place, and time.  Normal gait and station  Skin: Skin is warm. No rash noted. No cyanosis. Nails show no clubbing.  Normal skin turgor  Psychiatric: Judgment normal.  Appropriate affect     Assessment/Plan 21-year-old female with acute appendicitis.  1.  N.p.o., IV antibiotics 2.  We will consent the patient for laparoscopic appendectomy by Dr. Gerkin. 3.I discussed with the patient the risks benefits of the procedure to include but not limited to: Infection, bleeding, damage to surrounding structures, possible ileus, possible postoperative infection. Patient voiced understanding and wishes to proceed.   Ramirez Jr., Armando, MD 04/02/2017, 6:34 AM   

## 2017-04-02 NOTE — Progress Notes (Signed)
Text paged surgery to inform him that patient arrived from [facility] [place], will continue to monitor at this time

## 2017-04-02 NOTE — Anesthesia Procedure Notes (Signed)
Procedure Name: Intubation Date/Time: 04/02/2017 11:31 AM Performed by: Lyndle Herrlich, MD Pre-anesthesia Checklist: Patient identified, Emergency Drugs available, Suction available, Patient being monitored and Timeout performed Patient Re-evaluated:Patient Re-evaluated prior to induction Oxygen Delivery Method: Circle system utilized Preoxygenation: Pre-oxygenation with 100% oxygen Induction Type: IV induction and Cricoid Pressure applied Ventilation: Mask ventilation without difficulty Laryngoscope Size: Mac and 4 Grade View: Grade I Tube type: Oral Tube size: 7.5 mm Number of attempts: 1 Airway Equipment and Method: Stylet Placement Confirmation: breath sounds checked- equal and bilateral,  ETT inserted through vocal cords under direct vision and positive ETCO2 Secured at: 21 cm Tube secured with: Tape Dental Injury: Teeth and Oropharynx as per pre-operative assessment

## 2017-04-02 NOTE — Progress Notes (Signed)
    CC:  Abdominal pain  Subjective: Dr. Gerrit FriendsGerkin has seen her and she is comfortable awaiting surgery.    Objective: Vital signs in last 24 hours: Temp:  [97.4 F (36.3 C)-98.9 F (37.2 C)] 97.4 F (36.3 C) (03/20 0506) Pulse Rate:  [82-114] 82 (03/20 0506) Resp:  [12-20] 12 (03/20 0506) BP: (105-132)/(47-84) 105/47 (03/20 0506) SpO2:  [96 %-100 %] 98 % (03/20 0506) Weight:  [124.3 kg (274 lb)-126.4 kg (278 lb 10.6 oz)] 126.4 kg (278 lb 10.6 oz) (03/19 2356) Last BM Date: 04/01/17 1300 IV recorded NPO Urine x 2 Afebrile, VSS Labs from last PM WBC 15.4, H/H is down some CMP OK  Intake/Output from previous day: 03/19 0701 - 03/20 0700 In: 1300 [IV Piggyback:1100] Out: -  Intake/Output this shift: No intake/output data recorded.  General appearance: alert, cooperative and no distress  Lab Results:  Recent Labs    04/01/17 1924  WBC 15.4*  HGB 11.7*  HCT 35.9*  PLT 391    BMET Recent Labs    04/01/17 1924  NA 136  K 3.6  CL 105  CO2 23  GLUCOSE 92  BUN 8  CREATININE 0.76  CALCIUM 9.2   PT/INR No results for input(s): LABPROT, INR in the last 72 hours.  Recent Labs  Lab 04/01/17 1924  AST 16  ALT 16  ALKPHOS 77  BILITOT 0.5  PROT 7.9  ALBUMIN 3.9     Lipase     Component Value Date/Time   LIPASE 23 04/01/2017 1924     Prior to Admission medications   Medication Sig Start Date End Date Taking? Authorizing Provider  diphenhydrAMINE (BENADRYL) 25 mg capsule Take 25 mg by mouth every 6 (six) hours as needed for allergies.   Yes [provider]  Multiple Vitamin (MULTIVITAMIN WITH MINERALS) TABS tablet Take 1 tablet by mouth daily.   Yes [provider]    Medications: . Chlorhexidine Gluconate Cloth  6 each Topical Q0600   . dextrose 5 % and 0.9% NaCl 100 mL/hr at 04/02/17 0650  . metronidazole 500 mg (04/02/17 0750)   Anti-infectives (From admission, onward)   Start     Dose/Rate Route Frequency Ordered Stop   04/02/17 0700  metroNIDAZOLE (FLAGYL) IVPB 500 mg     500 mg 100 mL/hr over 60 Minutes Intravenous Every 8 hours 04/02/17 0634     04/01/17 2200  cefTRIAXone (ROCEPHIN) 2 g in sodium chloride 0.9 % 100 mL IVPB     2 g 200 mL/hr over 30 Minutes Intravenous  Once 04/01/17 2159 04/01/17 2300   04/01/17 2200  metroNIDAZOLE (FLAGYL) IVPB 500 mg     500 mg 100 mL/hr over 60 Minutes Intravenous  Once 04/01/17 2159 04/02/17 0000     Assessment/Plan GERD Hx of ovarian cyst Body mass index is 49.3  Acute appendicitis  FEN:  IV fluids/NPO ID:  Rocephin/Flagyl 3/19=>> day 2 DVT:  SCD - OR later today Foley:  None Follow up:  DOW  Plan:  Surgery later this AM    LOS: 0 days    Kuron Docken 04/02/2017 [phone]

## 2017-04-02 NOTE — Progress Notes (Signed)
Notified Dr. Derrell Lollingamirez via text page that patient arrived to the floor. MD returned page with phone call, informed him via telephone call that pt had arrived from [facility] [place] [facility]. Will continue to monitor closely at this time.

## 2017-04-02 NOTE — Progress Notes (Signed)
Patient back from surgery.  Vital signs stable.  Family at bedside.

## 2017-04-03 ENCOUNTER — Observation Stay (HOSPITAL_COMMUNITY): Payer: Self-pay

## 2017-04-03 MED ORDER — IBUPROFEN 800 MG PO TABS: 400.0000 mg | ORAL_TABLET | Freq: Four times a day (QID) | ORAL | 0 refills | Status: DC | PRN

## 2017-04-03 MED ORDER — ACETAMINOPHEN 325 MG PO TABS: 650.0000 mg | ORAL_TABLET | Freq: Four times a day (QID) | ORAL | Status: DC | PRN

## 2017-04-03 MED ORDER — PHENOL 1.4 % MT LIQD
1.0000 | OROMUCOSAL | Status: DC | PRN
  Filled 2017-04-03: qty 177

## 2017-04-03 MED ORDER — HYDROCODONE-ACETAMINOPHEN 5-325 MG PO TABS: 1.0000 | ORAL_TABLET | ORAL | 0 refills | Status: DC | PRN

## 2017-04-03 NOTE — Anesthesia Postprocedure Evaluation (Signed)
Anesthesia Post Note  Patient: Frederico HammanQuaneshia Diclemente  Procedure(s) Performed: APPENDECTOMY LAPAROSCOPIC (N/A Abdomen)     Patient location during evaluation: PACU Anesthesia Type: General Level of consciousness: awake and alert Pain management: pain level controlled Vital Signs Assessment: post-procedure vital signs reviewed and stable Respiratory status: spontaneous breathing, nonlabored ventilation, respiratory function stable and patient connected to nasal cannula oxygen Cardiovascular status: blood pressure returned to baseline and stable Postop Assessment: no apparent nausea or vomiting Anesthetic complications: no    Last Vitals:  Vitals Value Taken Time  BP    Temp    Pulse    Resp    SpO2      Last Pain:  Vitals:   04/03/17 0401  TempSrc: Oral  PainSc:                  Jiles GarterJACKSON,Aune Adami EDWARD

## 2017-04-03 NOTE — Progress Notes (Signed)
[facility] Progress Note  1 Day Post-Op  Subjective: CC:  C/o sore throat and some substernal chest pain with inspiration. Denies SOB, tachycardia, diaphoresis. Denies fever. Reports mild abdominal pain, improved w hydrocodone. Tolerating PO. Mobilizing. Urinating without dysuria.  Objective: Vital signs in last 24 hours: Temp:  [98 F (36.7 C)-98.8 F (37.1 C)] 98.3 F (36.8 C) (03/21 0401) Pulse Rate:  [72-94] 79 (03/21 0401) Resp:  [16-18] 18 (03/21 0401) BP: (106-122)/(50-65) 110/54 (03/21 0401) SpO2:  [92 %-100 %] 93 % (03/21 0401) Last BM Date: 04/01/17  Intake/Output from previous day: 03/20 0701 - 03/21 0700 In: 1352.5 [I.V.:1352.5] Out: -  Intake/Output this shift: No intake/output data recorded.  PE: Gen:  Alert, NAD, pleasant Card:  Regular rate and rhythm, pedal pulses 2+ BL Pulm:  Normal effort, clear to auscultation bilaterally Abd: Soft, appropriately tender, non-distended, bowel sounds present in all 4 quadrants, incisions C/D/I Skin: warm and dry, no rashes  Psych: A&Ox3   Lab Results:  Recent Labs    04/01/17 1924  WBC 15.4*  HGB 11.7*  HCT 35.9*  PLT 391   BMET Recent Labs    04/01/17 1924  NA 136  K 3.6  CL 105  CO2 23  GLUCOSE 92  BUN 8  CREATININE 0.76  CALCIUM 9.2   PT/INR No results for input(s): LABPROT, INR in the last 72 hours. CMP     Component Value Date/Time   NA 136 04/01/2017 1924   K 3.6 04/01/2017 1924   CL 105 04/01/2017 1924   CO2 23 04/01/2017 1924   GLUCOSE 92 04/01/2017 1924   BUN 8 04/01/2017 1924   CREATININE 0.76 04/01/2017 1924   CALCIUM 9.2 04/01/2017 1924   PROT 7.9 04/01/2017 1924   ALBUMIN 3.9 04/01/2017 1924   AST 16 04/01/2017 1924   ALT 16 04/01/2017 1924   ALKPHOS 77 04/01/2017 1924   BILITOT 0.5 04/01/2017 1924   GFRNONAA >60 04/01/2017 1924   GFRAA >60 04/01/2017 1924   Lipase     Component Value Date/Time   LIPASE 23 04/01/2017 1924       Studies/Results: Ct  Abdomen Pelvis W Contrast  Result Date: 04/01/2017 CLINICAL DATA:  Right lower quadrant pain EXAM: CT ABDOMEN AND PELVIS WITH CONTRAST TECHNIQUE: Multidetector CT imaging of the abdomen and pelvis was performed using the standard protocol following bolus administration of intravenous contrast. CONTRAST:  ISOVUE-300 IOPAMIDOL (ISOVUE-300) INJECTION 61% COMPARISON:  CT 05/17/2013 FINDINGS: Lower chest: Lung bases demonstrate no acute consolidation or effusion. Normal heart size. Hepatobiliary: No focal liver abnormality is seen. No gallstones, gallbladder wall thickening, or biliary dilatation. Pancreas: Unremarkable. No pancreatic ductal dilatation or surrounding inflammatory changes. Spleen: Normal in size without focal abnormality. Adrenals/Urinary Tract: Adrenal glands are unremarkable. Kidneys are normal, without renal calculi, focal lesion, or hydronephrosis. Bladder is unremarkable. Stomach/Bowel: Stomach is nonenlarged. No dilated small bowel. Abnormal appendix in the right lower quadrant, measuring up to 1 cm in diameter. Moderate periappendiceal soft tissue stranding. No extraluminal gas. Vascular/Lymphatic: Nonaneurysmal aorta. Multiple right lower quadrant subcentimeter mesenteric lymph nodes measuring up to 8 mm in size. Reproductive: Uterus unremarkable. Possible rim enhancing lesion in the right ovary. Other: Small free fluid in the colon.  No free air. Musculoskeletal: No acute or significant osseous findings. IMPRESSION: 1. Findings consistent with acute appendicitis. Appendix: Location: Right lower quadrant Diameter: 10 mm Appendicolith: None Mucosal hyper-enhancement: Mild mucosal enhancement present. Extraluminal gas: Not seen Periappendiceal collection: Not seen 2.   Trace  free fluid in the pelvis 3. Possible rim enhancing right adnexal lesion, suggest correlation with nonemergent pelvic ultrasound. Electronically Signed   By: Jasmine PangKim  Fujinaga M.D.   On: 04/01/2017 21:51     Anti-infectives: Anti-infectives (From admission, onward)   Start     Dose/Rate Route Frequency Ordered Stop   04/02/17 2200  cefTRIAXone (ROCEPHIN) 2 g in sodium chloride 0.9 % 100 mL IVPB     2 g 200 mL/hr over 30 Minutes Intravenous Every 24 hours 04/02/17 1347     04/02/17 1600  metroNIDAZOLE (FLAGYL) IVPB 500 mg     500 mg 100 mL/hr over 60 Minutes Intravenous Every 8 hours 04/02/17 1347     04/02/17 0700  metroNIDAZOLE (FLAGYL) IVPB 500 mg  Status:  Discontinued     500 mg 100 mL/hr over 60 Minutes Intravenous Every 8 hours 04/02/17 0634 04/02/17 1347   04/01/17 2200  cefTRIAXone (ROCEPHIN) 2 g in sodium chloride 0.9 % 100 mL IVPB     2 g 200 mL/hr over 30 Minutes Intravenous  Once 04/01/17 2159 04/01/17 2300   04/01/17 2200  metroNIDAZOLE (FLAGYL) IVPB 500 mg     500 mg 100 mL/hr over 60 Minutes Intravenous  Once 04/01/17 2159 04/02/17 0000     Assessment/Plan Acute appendicitis POD#1 laparoscopic appendectomy Dr. Darnell Levelodd Gerkin  Pain is controlled, tolerating oral intake, urinating without hesitancy, mobilizing.  Patient endorses some substernal chest pain that occurs with inspiration.  Will order chest x-ray. If CXR is normal, patient will be stable for discharge home today. She will follow up in our [facility] office in 2-3 weeks.     LOS: 0 days    Adam PhenixElizabeth S Warnie Belair , [facility] 04/03/2017, 9:51 AM Pager: [phone] Consults: [phone] Mon-Fri 7:00 am-4:30 pm Sat-Sun 7:00 am-11:30 am

## 2017-04-03 NOTE — Progress Notes (Signed)
Went over d/c instructions with patient.  She verbalized understanding.  Left hospital via w/c and personal vehicle. 

## 2017-04-03 NOTE — Discharge Instructions (Signed)

## 2017-04-03 NOTE — Discharge Summary (Signed)
[facility] Discharge Summary   Patient ID: Miranda Mack MRN: [id number] DOB/AGE: Jun 05, 1995 22 y.o.  Admit date: 04/01/2017 Discharge date: 04/03/2017   Discharge Diagnosis Patient Active Problem List   Diagnosis Date Noted  . Appendicitis 04/01/2017   Imaging: Dg Chest 2 View  Result Date: 04/03/2017 CLINICAL DATA:  Postoperative mid chest pain. Some shortness of breath and pain with inspiration. EXAM: CHEST - 2 VIEW COMPARISON:  09/19/2015 FINDINGS: There is streaky opacity at the left lung base consistent with atelectasis. Lungs are otherwise clear. No pleural effusion or pneumothorax. Heart, mediastinum and hila are within normal limits. Skeletal structures are unremarkable. IMPRESSION: 1. No acute cardiopulmonary disease. 2. Left lung base atelectasis. Electronically Signed   By: Amie Portland M.D.   On: 04/03/2017 12:14   Ct Abdomen Pelvis W Contrast  Result Date: 04/01/2017 CLINICAL DATA:  Right lower quadrant pain EXAM: CT ABDOMEN AND PELVIS WITH CONTRAST TECHNIQUE: Multidetector CT imaging of the abdomen and pelvis was performed using the standard protocol following bolus administration of intravenous contrast. CONTRAST:  ISOVUE-300 IOPAMIDOL (ISOVUE-300) INJECTION 61% COMPARISON:  CT 05/17/2013 FINDINGS: Lower chest: Lung bases demonstrate no acute consolidation or effusion. Normal heart size. Hepatobiliary: No focal liver abnormality is seen. No gallstones, gallbladder wall thickening, or biliary dilatation. Pancreas: Unremarkable. No pancreatic ductal dilatation or surrounding inflammatory changes. Spleen: Normal in size without focal abnormality. Adrenals/Urinary Tract: Adrenal glands are unremarkable. Kidneys are normal, without renal calculi, focal lesion, or hydronephrosis. Bladder is unremarkable. Stomach/Bowel: Stomach is nonenlarged. No dilated small bowel. Abnormal appendix in the right lower quadrant, measuring up to 1 cm in diameter. Moderate  periappendiceal soft tissue stranding. No extraluminal gas. Vascular/Lymphatic: Nonaneurysmal aorta. Multiple right lower quadrant subcentimeter mesenteric lymph nodes measuring up to 8 mm in size. Reproductive: Uterus unremarkable. Possible rim enhancing lesion in the right ovary. Other: Small free fluid in the colon.  No free air. Musculoskeletal: No acute or significant osseous findings. IMPRESSION: 1. Findings consistent with acute appendicitis. Appendix: Location: Right lower quadrant Diameter: 10 mm Appendicolith: None Mucosal hyper-enhancement: Mild mucosal enhancement present. Extraluminal gas: Not seen Periappendiceal collection: Not seen 2.   Trace free fluid in the pelvis 3. Possible rim enhancing right adnexal lesion, suggest correlation with nonemergent pelvic ultrasound. Electronically Signed   By: Jasmine Pang M.D.   On: 04/01/2017 21:51    Procedures Dr. Darnell Level (04/02/2017)-laparoscopic appendectomy  Hospital Course:  [name] who presented to [facility] with 2 days of abdominal pain.  Workup significant for acute appendicitis.  Patient was admitted and underwent procedure listed above.  Tolerated procedure well and was transferred to the floor.  Diet was advanced as tolerated.  On POD#1, the patient was voiding well, tolerating diet, ambulating well, pain well controlled, vital signs stable, incisions c/d/i and felt stable for discharge home.  She expressed some postoperative chest pain with inspiration, chest x-ray as above revealed no acute abnormality. Patient will follow up in our office in 2 weeks and knows to call with questions or concerns.  She will call to confirm appointment date/time.     Allergies as of 04/03/2017   Not on File     Medication List    TAKE these medications   acetaminophen 325 MG tablet Commonly known as:  TYLENOL Take 2 tablets (650 mg total) by mouth every 6 (six) hours as needed for mild pain (or Fever >/=  101).   diphenhydrAMINE 25 mg capsule Commonly known as:  BENADRYL Take  25 mg by mouth every 6 (six) hours as needed for allergies.   HYDROcodone-acetaminophen 5-325 MG tablet Commonly known as:  NORCO/VICODIN Take 1 tablet by mouth every 4 (four) hours as needed for moderate pain.   ibuprofen 800 MG tablet Commonly known as:  ADVIL,MOTRIN Take 0.5 tablets (400 mg total) by mouth every 6 (six) hours as needed for mild pain or moderate pain.   multivitamin with minerals Tabs tablet Take 1 tablet by mouth daily.        Follow-up Information    [facility]   Specialty:  General Surgery Why:  call to confirm your post-operative appointment date/time. Contact information: [address] [place] [id number] [phone]          Signed: Hosie SpangleElizabeth Simaan, [facility] 04/03/2017, 1:37 PM Pager: [phone] Consults: [phone] Mon-Fri 7:00 am-4:30 pm Sat-Sun 7:00 am-11:30 am

## 2017-04-04 MED FILL — Phenol Liquid 1.4%: OROMUCOSAL | Qty: 177 | Status: AC

## 2017-04-05 ENCOUNTER — Emergency Department (HOSPITAL_COMMUNITY)
Admission: EM | Admit: 2017-04-05 | Discharge: 2017-04-06 | Disposition: A | Discharge status: Home / Self Care | Payer: Self-pay | Attending: Emergency Medicine | Admitting: Emergency Medicine

## 2017-04-05 DIAGNOSIS — R05 Cough: Secondary | ICD-10-CM | POA: Insufficient documentation

## 2017-04-05 DIAGNOSIS — R112 Nausea with vomiting, unspecified: Secondary | ICD-10-CM | POA: Insufficient documentation

## 2017-04-05 DIAGNOSIS — E860 Dehydration: Secondary | ICD-10-CM | POA: Insufficient documentation

## 2017-04-05 DIAGNOSIS — R059 Cough, unspecified: Secondary | ICD-10-CM

## 2017-04-05 DIAGNOSIS — R197 Diarrhea, unspecified: Secondary | ICD-10-CM | POA: Insufficient documentation

## 2017-04-05 DIAGNOSIS — R109 Unspecified abdominal pain: Secondary | ICD-10-CM | POA: Insufficient documentation

## 2017-04-05 NOTE — ED Triage Notes (Signed)
Pt had her appendix removed Wednesday and discharged Thursday, since then she has been vomiting and having diarrhea

## 2017-04-06 ENCOUNTER — Emergency Department (HOSPITAL_COMMUNITY): Payer: Self-pay

## 2017-04-06 ENCOUNTER — Other Ambulatory Visit: Payer: Self-pay

## 2017-04-06 LAB — URINALYSIS, ROUTINE W REFLEX MICROSCOPIC
Bilirubin Urine: NEGATIVE
GLUCOSE, UA: NEGATIVE mg/dL
HGB URINE DIPSTICK: NEGATIVE
KETONES UR: 5 mg/dL — AB
Leukocytes, UA: NEGATIVE
NITRITE: NEGATIVE
PROTEIN: NEGATIVE mg/dL
Specific Gravity, Urine: 1.03 (ref 1.005–1.030)
pH: 6 (ref 5.0–8.0)

## 2017-04-06 LAB — CBC WITH DIFFERENTIAL/PLATELET
BASOS PCT: 0 %
Basophils Absolute: 0 10*3/uL (ref 0.0–0.1)
EOS ABS: 0.5 10*3/uL (ref 0.0–0.7)
EOS PCT: 3 %
HCT: 36.5 % (ref 36.0–46.0)
Hemoglobin: 11.8 g/dL — ABNORMAL LOW (ref 12.0–15.0)
Lymphocytes Relative: 21 %
Lymphs Abs: 3.5 10*3/uL (ref 0.7–4.0)
MCH: 28.4 pg (ref 26.0–34.0)
MCHC: 32.3 g/dL (ref 30.0–36.0)
MCV: 87.7 fL (ref 78.0–100.0)
Monocytes Absolute: 1 10*3/uL (ref 0.1–1.0)
Monocytes Relative: 6 %
Neutro Abs: 11.7 10*3/uL — ABNORMAL HIGH (ref 1.7–7.7)
Neutrophils Relative %: 70 %
PLATELETS: 453 10*3/uL — AB (ref 150–400)
RBC: 4.16 MIL/uL (ref 3.87–5.11)
RDW: 15.3 % (ref 11.5–15.5)
WBC: 16.8 10*3/uL — AB (ref 4.0–10.5)

## 2017-04-06 LAB — I-STAT BETA HCG BLOOD, ED (MC, WL, AP ONLY)

## 2017-04-06 LAB — COMPREHENSIVE METABOLIC PANEL
ALT: 13 U/L — AB (ref 14–54)
ANION GAP: 8 (ref 5–15)
AST: 15 U/L (ref 15–41)
Albumin: 3.9 g/dL (ref 3.5–5.0)
Alkaline Phosphatase: 67 U/L (ref 38–126)
BUN: 8 mg/dL (ref 6–20)
CHLORIDE: 102 mmol/L (ref 101–111)
CO2: 26 mmol/L (ref 22–32)
Calcium: 9.1 mg/dL (ref 8.9–10.3)
Creatinine, Ser: 0.77 mg/dL (ref 0.44–1.00)
GFR calc non Af Amer: >60 mL/min (ref >60)
Glucose, Bld: 96 mg/dL (ref 65–99)
Potassium: 3.8 mmol/L (ref 3.5–5.1)
SODIUM: 136 mmol/L (ref 135–145)
Total Bilirubin: 0.7 mg/dL (ref 0.3–1.2)
Total Protein: 7.6 g/dL (ref 6.5–8.1)

## 2017-04-06 LAB — LIPASE, BLOOD: Lipase: 22 U/L (ref 11–51)

## 2017-04-06 LAB — INFLUENZA PANEL BY PCR (TYPE A & B)
INFLBPCR: NEGATIVE
Influenza A By PCR: NEGATIVE

## 2017-04-06 LAB — I-STAT CG4 LACTIC ACID, ED: LACTIC ACID, VENOUS: 0.78 mmol/L (ref 0.5–1.9)

## 2017-04-06 MED ORDER — SODIUM CHLORIDE 0.9 % IV BOLUS (SEPSIS)
1000.0000 mL | Freq: Once | INTRAVENOUS | Status: AC
  Administered 2017-04-06: 1000 mL via INTRAVENOUS

## 2017-04-06 MED ORDER — ONDANSETRON HCL 4 MG/2ML IJ SOLN
4.0000 mg | Freq: Once | INTRAMUSCULAR | Status: AC
  Administered 2017-04-06: 4 mg via INTRAVENOUS
  Filled 2017-04-06: qty 2

## 2017-04-06 MED ORDER — ONDANSETRON 8 MG PO TBDP: ORAL_TABLET | ORAL | 0 refills | Status: DC

## 2017-04-06 MED ORDER — IOPAMIDOL (ISOVUE-300) INJECTION 61%
INTRAVENOUS | Status: AC
  Filled 2017-04-06: qty 100

## 2017-04-06 MED ORDER — LOPERAMIDE HCL 2 MG PO CAPS: 2.0000 mg | ORAL_CAPSULE | Freq: Four times a day (QID) | ORAL | 0 refills | Status: DC | PRN

## 2017-04-06 MED ORDER — IOPAMIDOL (ISOVUE-300) INJECTION 61%
100.0000 mL | Freq: Once | INTRAVENOUS | Status: AC | PRN
  Administered 2017-04-06: 100 mL via INTRAVENOUS

## 2017-04-06 MED ORDER — MORPHINE SULFATE (PF) 4 MG/ML IV SOLN
4.0000 mg | Freq: Once | INTRAVENOUS | Status: AC
  Administered 2017-04-06: 4 mg via INTRAVENOUS
  Filled 2017-04-06: qty 1

## 2017-04-06 MED ORDER — SODIUM CHLORIDE 0.9 % IJ SOLN
INTRAMUSCULAR | Status: AC
  Filled 2017-04-06: qty 50

## 2017-04-06 NOTE — Discharge Instructions (Addendum)
1. Medications: zofran, imodium usual home medications 2. Treatment: rest, drink plenty of fluids, advance diet slowly 3. Follow Up: Please followup with your surgeon in 2 days for discussion of your diagnoses and further evaluation after today's visit; if you do not have a primary care doctor use the resource guide provided to find one; Please return to the ER for persistent vomiting, high fevers or worsening symptoms

## 2017-04-06 NOTE — ED Provider Notes (Signed)
[facility]-EMERGENCY DEPT Provider Note   CSN: [id number] Arrival date & time: 04/05/17  2236     History   Chief Complaint Chief Complaint  Patient presents with  . Emesis  . Diarrhea    HPI Miranda Mack is a 22 y.o. female with a hx of GERD, ovarian cyst, appendectomy presents to the Emergency Department complaining of gradual, persistent, progressively worsening vomiting and diarrhea onset 04/03/17.  Pt reports diarrhea is watery and without blood.  Emesis is NBNB.  Pt reports some soreness at the sites of incision.  Pt denies fever, chills, neck pain, chest pain. Associated symptoms include generalized headache, lightheadedness and dizzy. Attempting to eat or drink causes nausea and vomiting.  Pt had appendectomy on 04/02/17 by Dr. Derrell Lolling and discharged on 04/03/17.  She has not a follow-up.  Patient also endorses some cough and mild shortness of breath.  She also endorses mild, generalized throbbing headache onset over the last several hours.  The history is provided by the patient and medical records. No language interpreter was used.    Past Medical History:  Diagnosis Date  . GERD (gastroesophageal reflux disease)   . Ovarian cyst     Patient Active Problem List   Diagnosis Date Noted  . Appendicitis 04/01/2017    Past Surgical History:  Procedure Laterality Date  . LAPAROSCOPIC APPENDECTOMY N/A 04/02/2017   Procedure: APPENDECTOMY LAPAROSCOPIC;  Surgeon: Darnell Level, MD;  Location: [facility] ORS;  Service: General;  Laterality: N/A;     OB History   None      Home Medications    Prior to Admission medications   Medication Sig Start Date End Date Taking? Authorizing Provider  acetaminophen (TYLENOL) 325 MG tablet Take 2 tablets (650 mg total) by mouth every 6 (six) hours as needed for mild pain (or Fever >/= 101). 04/03/17  Yes Simaan, Francine Graven, PA-C  diphenhydrAMINE (BENADRYL) 25 mg capsule Take 25 mg by mouth every 6 (six) hours as needed  for allergies.   Yes [provider]  HYDROcodone-acetaminophen (NORCO/VICODIN) 5-325 MG tablet Take 1 tablet by mouth every 4 (four) hours as needed for moderate pain. 04/03/17  Yes Simaan, Francine Graven, PA-C  ibuprofen (ADVIL,MOTRIN) 800 MG tablet Take 0.5 tablets (400 mg total) by mouth every 6 (six) hours as needed for mild pain or moderate pain. 04/03/17  Yes SimaanFrancine Graven, PA-C  Multiple Vitamin (MULTIVITAMIN WITH MINERALS) TABS tablet Take 1 tablet by mouth daily.   Yes [provider]  loperamide (IMODIUM) 2 MG capsule Take 1 capsule (2 mg total) by mouth 4 (four) times daily as needed for diarrhea or loose stools. 04/06/17   Shayden Gingrich, Dahlia Client, PA-C  ondansetron (ZOFRAN ODT) 8 MG disintegrating tablet 8mg  ODT q4 hours prn nausea 04/06/17   Nakyiah Kuck, Dahlia Client, PA-C    Family History No family history on file.  Social History Social History   Tobacco Use  . Smoking status: Never Smoker  . Smokeless tobacco: Never Used  Substance Use Topics  . Alcohol use: Yes    Comment: occ  . Drug use: No     Allergies   Patient has no known allergies.   Review of Systems Review of Systems  Constitutional: Negative for appetite change, diaphoresis, fatigue, fever and unexpected weight change.  HENT: Negative for mouth sores.   Eyes: Negative for visual disturbance.  Respiratory: Positive for cough and shortness of breath. Negative for chest tightness and wheezing.   Cardiovascular: Negative for chest pain.  Gastrointestinal: Positive for abdominal pain, diarrhea, nausea and vomiting. Negative for constipation.  Endocrine: Negative for polydipsia, polyphagia and polyuria.  Genitourinary: Negative for dysuria, frequency, hematuria and urgency.  Musculoskeletal: Negative for back pain and neck stiffness.  Skin: Negative for rash.  Allergic/Immunologic: Negative for immunocompromised state.  Neurological: Negative for syncope, light-headedness and headaches.    Hematological: Does not bruise/bleed easily.  Psychiatric/Behavioral: Negative for sleep disturbance. The patient is not nervous/anxious.      Physical Exam Updated Vital Signs BP (!) 109/57 (BP Location: Right Arm)   Pulse 77   Temp 98.4 F (36.9 C) (Oral)   Resp 16   Ht 5\' 3"  (1.6 m)   Wt 122.9 kg (271 lb)   LMP 03/11/2017 (Approximate) Comment: HCG < 5 04-06-17  SpO2 99%   BMI 48.01 kg/m   Physical Exam  Constitutional: She appears well-developed and well-nourished. No distress.  Awake, alert, nontoxic appearance  HENT:  Head: Normocephalic and atraumatic.  Mouth/Throat: Oropharynx is clear and moist. No oropharyngeal exudate.  Eyes: Conjunctivae are normal. No scleral icterus.  Neck: Normal range of motion. Neck supple.  Cardiovascular: Normal rate, regular rhythm and intact distal pulses.  Pulmonary/Chest: Effort normal and breath sounds normal. No respiratory distress. She has no wheezes.  Equal chest expansion  Abdominal: Soft. Bowel sounds are normal. She exhibits no mass. There is generalized tenderness. There is no rigidity, no rebound and no guarding.  Surgical incisions are healing well.  No erythema, dehiscence or purulent drainage.  Musculoskeletal: Normal range of motion. She exhibits no edema.  Neurological: She is alert.  Speech is clear and goal oriented Moves extremities without ataxia  Skin: Skin is warm and dry. She is not diaphoretic.  Psychiatric: She has a normal mood and affect.  Nursing note and vitals reviewed.    ED Treatments / Results  Labs (all labs ordered are listed, but only abnormal results are displayed) Labs Reviewed  CBC WITH DIFFERENTIAL/PLATELET - Abnormal; Notable for the following components:      Result Value   WBC 16.8 (*)    Hemoglobin 11.8 (*)    Platelets 453 (*)    Neutro Abs 11.7 (*)    All other components within normal limits  COMPREHENSIVE METABOLIC PANEL - Abnormal; Notable for the following components:   ALT  13 (*)    All other components within normal limits  URINALYSIS, ROUTINE W REFLEX MICROSCOPIC - Abnormal; Notable for the following components:   APPearance HAZY (*)    Ketones, ur 5 (*)    Bacteria, UA RARE (*)    Squamous Epithelial / LPF 6-30 (*)    All other components within normal limits  LIPASE, BLOOD  INFLUENZA PANEL BY PCR (TYPE A & B)  I-STAT BETA HCG BLOOD, ED ([facility], [facility], [facility] ONLY)  I-STAT CG4 LACTIC ACID, ED    Radiology Ct Abdomen Pelvis W Contrast  Result Date: 04/06/2017 CLINICAL DATA:  Nausea and vomiting.  Appendectomy 4 days ago. EXAM: CT ABDOMEN AND PELVIS WITH CONTRAST TECHNIQUE: Multidetector CT imaging of the abdomen and pelvis was performed using the standard protocol following bolus administration of intravenous contrast. CONTRAST:  ISOVUE-300 IOPAMIDOL (ISOVUE-300) INJECTION 61% COMPARISON:  CT 04/01/2017 FINDINGS: Lower chest: Clear lung bases. Hepatobiliary: No focal hepatic lesion. Gallbladder physiologically distended, no calcified stone. No biliary dilatation. Pancreas: No ductal dilatation or inflammation. Spleen: Normal in size without focal abnormality. Adrenals/Urinary Tract: Normal adrenal glands. No hydronephrosis or perinephric edema. Homogeneous renal enhancement. Urinary bladder is  completely decompressed. Stomach/Bowel: Post appendectomy with enteric suture at the base of the cecum. No residual inflammatory changes or postoperative abscess. No bowel wall thickening, inflammatory change or obstruction. Stomach physiologically distended. Mild fecalization of distal small bowel contents. Moderate stool in the ascending and transverse colon. Descending and sigmoid colon are nondistended. No colonic inflammation. Vascular/Lymphatic: Few prominent ileocolic nodes are likely reactive. No acute vascular finding. Reproductive: Small peripheral enhancing structure in the right ovary is unchanged, best appreciated on coronal reformat image 39. Left ovary and uterus are  normal. Other: No intra-abdominal or pelvic abscess. Few foci of air in the anterior upper abdominal omental fat likely residual postsurgical. No large volume of free air. Trace free fluid in the pelvis. Musculoskeletal: There are no acute or suspicious osseous abnormalities. IMPRESSION: 1. Post recent appendectomy without postsurgical complication. No evidence of abscess or bowel inflammation. No obstruction. 2. Peripherally enhancing structure in the right ovary is unchanged and may represent a corpus luteal cyst. Electronically Signed   By: Rubye Oaks M.D.   On: 04/06/2017 02:18    Procedures Procedures (including critical care time)  Medications Ordered in ED Medications  iopamidol (ISOVUE-300) 61 % injection (has no administration in time range)  sodium chloride 0.9 % injection (has no administration in time range)  iopamidol (ISOVUE-300) 61 % injection 100 mL (100 mLs Intravenous Contrast Given 04/06/17 0128)  ondansetron (ZOFRAN) injection 4 mg (4 mg Intravenous Given 04/06/17 0257)  sodium chloride 0.9 % bolus 1,000 mL (0 mLs Intravenous Stopped 04/06/17 0405)  morphine 4 MG/ML injection 4 mg (4 mg Intravenous Given 04/06/17 0258)  sodium chloride 0.9 % bolus 1,000 mL (0 mLs Intravenous Stopped 04/06/17 0515)     Initial Impression / Assessment and Plan / ED Course  I have reviewed the triage vital signs and the nursing notes.  Pertinent labs & imaging results that were available during my care of the patient were reviewed by me and considered in my medical decision making (see chart for details).  Clinical Course as of Apr 06 740  Sun Apr 06, 2017  0330 Tachycardic on arrival but this resolves almost completely.  Pulse Rate(!): 102 [HM]  0340 Tachycardia resolved.  No hypoxia.   [HM]  [id number] Reports she is feeling much better.  She has been able to tolerate p.o. without additional emesis.  Her headache has resolved and her abdominal pain has improved.   [HM]  0515 Patient  ambulates here in the emergency department without chest pain or shortness of breath.   [HM]    Clinical Course User Index [HM] Anorah Trias, Dahlia Client, [place]    Patient presents with nausea, vomiting and diarrhea 2 days after appendectomy.  She reports generalized abdominal soreness but no focal pain.  She is afebrile on arrival.  Very mild tachycardia but no hypotension.  Patient given fluids here in the emergency department.  Tachycardia resolved even before fluids.  CT scan without acute abnormalities including no evidence of postop complication from her appendectomy.  No abscesses.  Chest x-ray without evidence of pneumonia.  Influenza is negative.  Patient does have leukocytosis however I believe this is likely reactive.  Electrolytes without abnormalities.  No evidence of urinary tract infection.  On repeat exam, abdomen is soft and minimally tender.  He remains nonfocal.  No rebound or guarding.  Her tachycardia is improved.  She has no chest pain or shortness of breath even with ambulation here in the emergency department.  Doubt pulmonary embolism at this time.  Patient has tolerated p.o. solids and fluids without difficulty.  She will be discharged home with Zofran.  She is to return immediately to the emergency department for fevers, persistent vomiting, return of chest pain or shortness of breath or other concerns.  Patient states understanding and is in agreement with the plan.  Final Clinical Impressions(s) / ED Diagnoses   Final diagnoses:  Diarrhea, unspecified type  Dehydration  Non-intractable vomiting with nausea, unspecified vomiting type  Cough    ED Discharge Orders        Ordered    ondansetron (ZOFRAN ODT) 8 MG disintegrating tablet     04/06/17 0507    loperamide (IMODIUM) 2 MG capsule  4 times daily PRN     04/06/17 0507       Jeric Slagel, Dahlia ClientHannah, PA-C 04/06/17 0746    Glynn Octaveancour, Stephen, MD 04/06/17 [phone]

## 2017-04-06 NOTE — ED Notes (Signed)
Pt drank water, ate a sandwich, and was able to keep it all down.

## 2017-04-07 LAB — CULTURE, BLOOD (ROUTINE X 2)
CULTURE: NO GROWTH
CULTURE: NO GROWTH
Special Requests: ADEQUATE
Special Requests: ADEQUATE

## 2018-01-25 ENCOUNTER — Emergency Department (HOSPITAL_BASED_OUTPATIENT_CLINIC_OR_DEPARTMENT_OTHER): Payer: Self-pay

## 2018-01-25 ENCOUNTER — Other Ambulatory Visit: Payer: Self-pay

## 2018-01-25 ENCOUNTER — Encounter (HOSPITAL_BASED_OUTPATIENT_CLINIC_OR_DEPARTMENT_OTHER): Payer: Self-pay | Admitting: *Deleted

## 2018-01-25 ENCOUNTER — Emergency Department (HOSPITAL_BASED_OUTPATIENT_CLINIC_OR_DEPARTMENT_OTHER)
Admission: EM | Admit: 2018-01-25 | Discharge: 2018-01-26 | Disposition: A | Discharge status: Home / Self Care | Payer: Self-pay | Attending: Emergency Medicine | Admitting: Emergency Medicine

## 2018-01-25 DIAGNOSIS — Z79899 Other long term (current) drug therapy: Secondary | ICD-10-CM | POA: Insufficient documentation

## 2018-01-25 DIAGNOSIS — R0789 Other chest pain: Secondary | ICD-10-CM | POA: Insufficient documentation

## 2018-01-25 DIAGNOSIS — M62838 Other muscle spasm: Secondary | ICD-10-CM | POA: Insufficient documentation

## 2018-01-25 LAB — BASIC METABOLIC PANEL
Anion gap: 7 (ref 5–15)
BUN: 13 mg/dL (ref 6–20)
CALCIUM: 9 mg/dL (ref 8.9–10.3)
CO2: 24 mmol/L (ref 22–32)
CREATININE: 0.77 mg/dL (ref 0.44–1.00)
Chloride: 107 mmol/L (ref 98–111)
GFR calc non Af Amer: >60 mL/min (ref >60)
Glucose, Bld: 116 mg/dL — ABNORMAL HIGH (ref 70–99)
Potassium: 3.4 mmol/L — ABNORMAL LOW (ref 3.5–5.1)
SODIUM: 138 mmol/L (ref 135–145)

## 2018-01-25 LAB — CBC
HCT: 35.1 % — ABNORMAL LOW (ref 36.0–46.0)
Hemoglobin: 11 g/dL — ABNORMAL LOW (ref 12.0–15.0)
MCH: 28.4 pg (ref 26.0–34.0)
MCHC: 31.3 g/dL (ref 30.0–36.0)
MCV: 90.7 fL (ref 80.0–100.0)
NRBC: 0 % (ref 0.0–0.2)
PLATELETS: 358 10*3/uL (ref 150–400)
RBC: 3.87 MIL/uL (ref 3.87–5.11)
RDW: 15.4 % (ref 11.5–15.5)
WBC: 15.4 10*3/uL — AB (ref 4.0–10.5)

## 2018-01-25 LAB — PREGNANCY, URINE: PREG TEST UR: NEGATIVE

## 2018-01-25 LAB — D-DIMER, QUANTITATIVE (NOT AT ARMC): D DIMER QUANT: 0.48 ug{FEU}/mL (ref 0.00–0.50)

## 2018-01-25 LAB — TROPONIN I

## 2018-01-25 MED ORDER — ALUM & MAG HYDROXIDE-SIMETH 200-200-20 MG/5ML PO SUSP
30.0000 mL | Freq: Once | ORAL | Status: AC
  Administered 2018-01-25: 30 mL via ORAL
  Filled 2018-01-25: qty 30

## 2018-01-25 NOTE — ED Notes (Signed)
Pt made aware that we need a urine sample. Pt states that she just used the restroom. RN informed patient to use call light and we would unhook her monitoring equipment and assist her to the bathroom

## 2018-01-25 NOTE — ED Provider Notes (Signed)
[facility] EMERGENCY DEPARTMENT Provider Note   CSN: [id number] Arrival date & time: 01/25/18  2126     History   Chief Complaint Chief Complaint  Patient presents with  . Chest Pain    HPI Miranda Mack is a 23 y.o. female.  The history is provided by the patient.  Chest Pain  Pain location:  Substernal area Pain quality: stabbing   Pain radiates to:  Does not radiate Pain severity:  Moderate Onset quality:  Sudden Timing:  Constant Progression:  Improving Chronicity:  New Context: eating and at rest   Relieved by:  Nothing Worsened by:  Nothing Ineffective treatments:  None tried Associated symptoms: no abdominal pain, no anorexia, no diaphoresis, no fever, no nausea, no palpitations, no PND, no shortness of breath and no syncope   Risk factors: no aortic disease, no diabetes mellitus, not female and no prior DVT/PE     Past Medical History:  Diagnosis Date  . GERD (gastroesophageal reflux disease)   . Ovarian cyst     Patient Active Problem List   Diagnosis Date Noted  . Appendicitis 04/01/2017    Past Surgical History:  Procedure Laterality Date  . LAPAROSCOPIC APPENDECTOMY N/A 04/02/2017   Procedure: APPENDECTOMY LAPAROSCOPIC;  Surgeon: Darnell Level, MD;  Location: [facility];  Service: General;  Laterality: N/A;     OB History   No obstetric history on file.      Home Medications    Prior to Admission medications   Medication Sig Start Date End Date Taking? Authorizing Provider  acetaminophen (TYLENOL) 325 MG tablet Take 2 tablets (650 mg total) by mouth every 6 (six) hours as needed for mild pain (or Fever >/= 101). 04/03/17   Simaan, Francine Graven, PA-C  diphenhydrAMINE (BENADRYL) 25 mg capsule Take 25 mg by mouth every 6 (six) hours as needed for allergies.    [provider]  HYDROcodone-acetaminophen (NORCO/VICODIN) 5-325 MG tablet Take 1 tablet by mouth every 4 (four) hours as needed for moderate pain. 04/03/17   Adam Phenix, PA-C  ibuprofen (ADVIL,MOTRIN) 800 MG tablet Take 0.5 tablets (400 mg total) by mouth every 6 (six) hours as needed for mild pain or moderate pain. 04/03/17   Adam Phenix, PA-C  loperamide (IMODIUM) 2 MG capsule Take 1 capsule (2 mg total) by mouth 4 (four) times daily as needed for diarrhea or loose stools. 04/06/17   Muthersbaugh, Dahlia Client, PA-C  meloxicam (MOBIC) 15 MG tablet meloxicam 15 mg tablet  TK 1 T PO ONCE A DAY WITH A MEAL PRN    [provider]  methocarbamol (ROBAXIN) 500 MG tablet methocarbamol 500 mg tablet  TK 1 T PO TID FOR 10 DAYS PRN    [provider]  Multiple Vitamin (MULTIVITAMIN WITH MINERALS) TABS tablet Take 1 tablet by mouth daily.    [provider]  ondansetron (ZOFRAN ODT) 8 MG disintegrating tablet 8mg  ODT q4 hours prn nausea 04/06/17   Muthersbaugh, Dahlia Client, PA-C    Family History No family history on file.  Social History Social History   Tobacco Use  . Smoking status: Never Smoker  . Smokeless tobacco: Never Used  Substance Use Topics  . Alcohol use: Yes    Comment: occ  . Drug use: No     Allergies   Patient has no known allergies.   Review of Systems Review of Systems  Constitutional: Negative for diaphoresis and fever.  Respiratory: Negative for shortness of breath.   Cardiovascular: Positive for  chest pain. Negative for palpitations, leg swelling, syncope and PND.  Gastrointestinal: Negative for abdominal pain, anorexia and nausea.  All other systems reviewed and are negative.    Physical Exam Updated Vital Signs BP 110/67   Pulse 73   Temp 98.7 F (37.1 C) (Oral)   Resp 17   Ht 5\' 3"  (1.6 m)   Wt 120.2 kg   LMP 12/27/2017   SpO2 100%   BMI 46.94 kg/m   Physical Exam Constitutional:      Appearance: She is well-developed. She is obese. She is not diaphoretic.  HENT:     Head: Atraumatic.     Mouth/Throat:     Mouth: Mucous membranes are moist.  Eyes:     Pupils: Pupils are  equal, round, and reactive to light.  Neck:     Musculoskeletal: Normal range of motion and neck supple.  Cardiovascular:     Rate and Rhythm: Normal rate and regular rhythm.  Pulmonary:     Effort: Pulmonary effort is normal. No respiratory distress.     Breath sounds: Normal breath sounds.  Abdominal:     Palpations: Abdomen is soft.     Tenderness: There is no abdominal tenderness.     Comments: Gassy throughout  Musculoskeletal: Normal range of motion.  Skin:    General: Skin is warm and dry.     Capillary Refill: Capillary refill takes less than 2 seconds.  Neurological:     General: No focal deficit present.     Mental Status: She is alert and oriented to person, place, and time.  Psychiatric:        Mood and Affect: Mood normal.      ED Treatments / Results  Labs (all labs ordered are listed, but only abnormal results are displayed) Labs Reviewed  BASIC METABOLIC PANEL - Abnormal; Notable for the following components:      Result Value   Potassium 3.4 (*)    Glucose, Bld 116 (*)    All other components within normal limits  CBC - Abnormal; Notable for the following components:   WBC 15.4 (*)    Hemoglobin 11.0 (*)    HCT 35.1 (*)    All other components within normal limits  TROPONIN I  PREGNANCY, URINE    EKG EKG Interpretation  Date/Time:  Sunday January 25 2018 21:35:36 EST Ventricular Rate:  98 PR Interval:  150 QRS Duration: 86 QT Interval:  322 QTC Calculation: 411 R Axis:   50 Text Interpretation:  Normal sinus rhythm Normal ECG No old tracing to compare Confirmed by ShannonJacubowitz, Doreatha MartinSam [phone]([id number]) on 01/25/2018 9:51:37 PM   Radiology Dg Chest 2 View  Result Date: 01/25/2018 CLINICAL DATA:  Acute onset of chest pain and interscapular back pain. Nonsmoker. EXAM: CHEST - 2 VIEW COMPARISON:  04/06/2017 and earlier. FINDINGS: Cardiomediastinal silhouette unremarkable and unchanged. Lungs clear. Bronchovascular markings normal. Pulmonary vascularity normal.  No visible pleural effusions. No pneumothorax. Visualized bony thorax intact. IMPRESSION: Normal examination. Electronically Signed   By: Hulan Saashomas  Lawrence M.D.   On: 01/25/2018 22:16    Procedures Procedures (including critical care time)  Medications Ordered in ED Medications  alum & mag hydroxide-simeth (MAALOX/MYLANTA) 200-200-20 MG/5ML suspension 30 mL (has no administration in time range)    Negative Ddimer in a low risk patient excludes PE.  No indication for imaging in this low risk patient.  I do not believe this is cardiac.  Symptoms consistent with MSK pain and patient just took mobic and  muscle relaxer.    Final Clinical Impressions(s) / ED Diagnoses   Return for pain, intractable cough, productive cough,fevers >100.4 unrelieved by medication, shortness of breath, intractable vomiting, or diarrhea, abdominal pain, Inability to tolerate liquids or food, cough, altered mental status or any concerns. No signs of systemic illness or infection. The patient is nontoxic-appearing on exam and vital signs are within normal limits.   I have reviewed the triage vital signs and the nursing notes. Pertinent labs &imaging results that were available during my care of the patient were reviewed by me and considered in my medical decision making (see chart for details).  After history, exam, and medical workup I feel the patient has been appropriately medically screened and is safe for discharge home. Pertinent diagnoses were discussed with the patient. Patient was given return precautions.    Efton Thomley, MD 01/26/18 0000

## 2018-01-25 NOTE — ED Notes (Signed)
Called lab and added D-dimer on

## 2018-01-25 NOTE — ED Triage Notes (Signed)
Pt reports chest pain, onset 1.5 hours ago; Worse when she takes a deep breath. States she had a cough last week

## 2018-03-01 ENCOUNTER — Other Ambulatory Visit: Payer: Self-pay

## 2018-03-01 ENCOUNTER — Emergency Department (HOSPITAL_BASED_OUTPATIENT_CLINIC_OR_DEPARTMENT_OTHER): Payer: Self-pay

## 2018-03-01 ENCOUNTER — Encounter (HOSPITAL_BASED_OUTPATIENT_CLINIC_OR_DEPARTMENT_OTHER): Payer: Self-pay | Admitting: *Deleted

## 2018-03-01 ENCOUNTER — Emergency Department (HOSPITAL_BASED_OUTPATIENT_CLINIC_OR_DEPARTMENT_OTHER)
Admission: EM | Admit: 2018-03-01 | Discharge: 2018-03-01 | Disposition: A | Discharge status: Home / Self Care | Payer: Self-pay | Attending: Emergency Medicine | Admitting: Emergency Medicine

## 2018-03-01 DIAGNOSIS — X500XXA Overexertion from strenuous movement or load, initial encounter: Secondary | ICD-10-CM | POA: Insufficient documentation

## 2018-03-01 DIAGNOSIS — Z79899 Other long term (current) drug therapy: Secondary | ICD-10-CM | POA: Insufficient documentation

## 2018-03-01 DIAGNOSIS — Y9389 Activity, other specified: Secondary | ICD-10-CM | POA: Insufficient documentation

## 2018-03-01 DIAGNOSIS — S93401A Sprain of unspecified ligament of right ankle, initial encounter: Secondary | ICD-10-CM | POA: Insufficient documentation

## 2018-03-01 DIAGNOSIS — Y999 Unspecified external cause status: Secondary | ICD-10-CM | POA: Insufficient documentation

## 2018-03-01 DIAGNOSIS — Y929 Unspecified place or not applicable: Secondary | ICD-10-CM | POA: Insufficient documentation

## 2018-03-01 MED ORDER — IBUPROFEN 400 MG PO TABS
600.0000 mg | ORAL_TABLET | Freq: Once | ORAL | Status: AC
  Administered 2018-03-01: 600 mg via ORAL
  Filled 2018-03-01: qty 1

## 2018-03-01 MED ORDER — HYDROCODONE-ACETAMINOPHEN 5-325 MG PO TABS
1.0000 | ORAL_TABLET | Freq: Once | ORAL | Status: AC
  Administered 2018-03-01: 1 via ORAL
  Filled 2018-03-01: qty 1

## 2018-03-01 NOTE — ED Notes (Signed)
ASO applied to R ankle.

## 2018-03-01 NOTE — ED Triage Notes (Signed)
Pt reports she fell down one stair and twisted her right ankle approx 15 minutes pta

## 2018-03-01 NOTE — ED Notes (Signed)
Patient transported to X-ray 

## 2018-03-01 NOTE — ED Notes (Addendum)
Pt c/o rt ankle pain r/t rolling ankle when she was going down stairs. Pt states ankle is tender to touch and is also having achilles pain. Pt states difficult to ambulate/bear weight. Pt denies taking any pain medication. Pt is prescibed meloxicam for back pain, and took that at 1400 today.

## 2018-03-01 NOTE — Discharge Instructions (Addendum)
Take ibuprofen and tylenol for the pain ° °

## 2018-03-04 NOTE — ED Provider Notes (Signed)
[facility] EMERGENCY DEPARTMENT Provider Note   CSN: [id number] Arrival date & time: 03/01/18  1835    History   Chief Complaint Chief Complaint  Patient presents with  . Ankle Injury    HPI Miranda Mack is a 23 y.o. female.     HPI Patient is a 23 year old female presents to the emergency department with complaints of right ankle pain on both the medial lateral malleolus after rolling her ankle while going downstairs today.  Her pain is mild to moderate in severity and worse with palpation movement of her ankle.  She states that it is difficult to walk secondary to the pain in the right ankle.  No other injury associated with the fall tonight.  She has not tried any medication prior to arrival.   Past Medical History:  Diagnosis Date  . GERD (gastroesophageal reflux disease)   . Ovarian cyst     Patient Active Problem List   Diagnosis Date Noted  . Appendicitis 04/01/2017    Past Surgical History:  Procedure Laterality Date  . APPENDECTOMY    . LAPAROSCOPIC APPENDECTOMY N/A 04/02/2017   Procedure: APPENDECTOMY LAPAROSCOPIC;  Surgeon: Darnell Level, MD;  Location: [facility];  Service: General;  Laterality: N/A;     OB History   No obstetric history on file.      Home Medications    Prior to Admission medications   Medication Sig Start Date End Date Taking? Authorizing Provider  meloxicam (MOBIC) 15 MG tablet meloxicam 15 mg tablet  TK 1 T PO ONCE A DAY WITH A MEAL PRN   Yes [provider]  methocarbamol (ROBAXIN) 500 MG tablet methocarbamol 500 mg tablet  TK 1 T PO TID FOR 10 DAYS PRN   Yes [provider]  acetaminophen (TYLENOL) 325 MG tablet Take 2 tablets (650 mg total) by mouth every 6 (six) hours as needed for mild pain (or Fever >/= 101). 04/03/17   Simaan, Francine Graven, PA-C  diphenhydrAMINE (BENADRYL) 25 mg capsule Take 25 mg by mouth every 6 (six) hours as needed for allergies.    [provider]    HYDROcodone-acetaminophen (NORCO/VICODIN) 5-325 MG tablet Take 1 tablet by mouth every 4 (four) hours as needed for moderate pain. 04/03/17   Adam Phenix, PA-C  ibuprofen (ADVIL,MOTRIN) 800 MG tablet Take 0.5 tablets (400 mg total) by mouth every 6 (six) hours as needed for mild pain or moderate pain. 04/03/17   Adam Phenix, PA-C  loperamide (IMODIUM) 2 MG capsule Take 1 capsule (2 mg total) by mouth 4 (four) times daily as needed for diarrhea or loose stools. 04/06/17   Muthersbaugh, Dahlia Client, PA-C  Multiple Vitamin (MULTIVITAMIN WITH MINERALS) TABS tablet Take 1 tablet by mouth daily.    [provider]  ondansetron (ZOFRAN ODT) 8 MG disintegrating tablet 8mg  ODT q4 hours prn nausea 04/06/17   Muthersbaugh, Dahlia Client, PA-C    Family History No family history on file.  Social History Social History   Tobacco Use  . Smoking status: Never Smoker  . Smokeless tobacco: Never Used  Substance Use Topics  . Alcohol use: Yes    Comment: occ  . Drug use: No     Allergies   Patient has no known allergies.   Review of Systems Review of Systems  All other systems reviewed and are negative.    Physical Exam Updated Vital Signs BP 109/84 (BP Location: Right Arm)   Pulse 87   Temp 98.4 F (36.9 C) (Oral)  Resp 18   Ht 5\' 3"  (1.6 m)   Wt 117.9 kg   LMP 01/27/2018   SpO2 100%   BMI 46.06 kg/m   Physical Exam Vitals signs and nursing note reviewed.  Constitutional:      Appearance: She is well-developed.  HENT:     Head: Normocephalic.  Neck:     Musculoskeletal: Normal range of motion.  Pulmonary:     Effort: Pulmonary effort is normal.  Abdominal:     General: There is no distension.  Musculoskeletal: Normal range of motion.     Comments: Mild tenderness of the right medial lateral malleolus without deformity noted.  There is mild swelling of the right ankle as compared to left.  Normal PT and DP pulse in the right foot.  Compartments of the right foot  are soft.  No tenderness at the base of the right fifth metatarsal.  Thompson's test results in plantar flexion of her foot.  Full range of motion of right hip and right knee.  Neurological:     Mental Status: She is alert and oriented to person, place, and time.      ED Treatments / Results  Labs (all labs ordered are listed, but only abnormal results are displayed) Labs Reviewed - No data to display  EKG None  Radiology No results found.  Procedures Procedures (including critical care time)  Medications Ordered in ED Medications  ibuprofen (ADVIL,MOTRIN) tablet 600 mg (600 mg Oral Given 03/01/18 1940)  HYDROcodone-acetaminophen (NORCO/VICODIN) 5-325 MG per tablet 1 tablet (1 tablet Oral Given 03/01/18 1940)     Initial Impression / Assessment and Plan / ED Course  I have reviewed the triage vital signs and the nursing notes.  Pertinent labs & imaging results that were available during my care of the patient were reviewed by me and considered in my medical decision making (see chart for details).        Images negative.  Sports medicine follow-up.  Anti-inflammatories recommended.  Ice and elevation.  Final Clinical Impressions(s) / ED Diagnoses   Final diagnoses:  Sprain of right ankle, unspecified ligament, initial encounter    ED Discharge Orders    None       Azalia Bilis, MD 03/04/18 [phone]

## 2018-07-10 ENCOUNTER — Other Ambulatory Visit: Payer: Self-pay

## 2018-07-10 ENCOUNTER — Emergency Department (HOSPITAL_BASED_OUTPATIENT_CLINIC_OR_DEPARTMENT_OTHER)
Admission: EM | Admit: 2018-07-10 | Discharge: 2018-07-10 | Disposition: A | Discharge status: Home / Self Care | Payer: Self-pay | Attending: Emergency Medicine | Admitting: Emergency Medicine

## 2018-07-10 ENCOUNTER — Encounter (HOSPITAL_BASED_OUTPATIENT_CLINIC_OR_DEPARTMENT_OTHER): Payer: Self-pay | Admitting: Emergency Medicine

## 2018-07-10 DIAGNOSIS — R239 Unspecified skin changes: Secondary | ICD-10-CM

## 2018-07-10 DIAGNOSIS — Z79899 Other long term (current) drug therapy: Secondary | ICD-10-CM | POA: Insufficient documentation

## 2018-07-10 DIAGNOSIS — R238 Other skin changes: Secondary | ICD-10-CM | POA: Insufficient documentation

## 2018-07-10 NOTE — Discharge Instructions (Addendum)
Apply antibacterial ointment to your skin to prevent infection. Keep a watch on this. If it grows in size or you get additional skin changes, you may need a skin biopsy for further assessment. Practice safe sexual intercourse.

## 2018-07-10 NOTE — ED Triage Notes (Signed)
Patient here with skin tag on left side of pelvic area. States she noticed it yesterday and she touched it and it started bleeding some. No other complaints.

## 2018-07-10 NOTE — ED Provider Notes (Signed)
[facility] EMERGENCY DEPARTMENT Provider Note   CSN: [id number] Arrival date & time: 07/10/18  1551     History   Chief Complaint Chief Complaint  Patient presents with  . Rash    HPI Miranda Mack is a 23 y.o. female.     HPI  Past Medical History:  Diagnosis Date  . GERD (gastroesophageal reflux disease)   . Ovarian cyst     Patient Active Problem List   Diagnosis Date Noted  . Appendicitis 04/01/2017    Past Surgical History:  Procedure Laterality Date  . APPENDECTOMY    . LAPAROSCOPIC APPENDECTOMY N/A 04/02/2017   Procedure: APPENDECTOMY LAPAROSCOPIC;  Surgeon: Armandina Gemma, MD;  Location: [facility];  Service: General;  Laterality: N/A;     OB History   No obstetric history on file.      Home Medications    Prior to Admission medications   Medication Sig Start Date End Date Taking? Authorizing Provider  acetaminophen (TYLENOL) 325 MG tablet Take 2 tablets (650 mg total) by mouth every 6 (six) hours as needed for mild pain (or Fever >/= 101). 04/03/17   Simaan, Darci Current, PA-C  diphenhydrAMINE (BENADRYL) 25 mg capsule Take 25 mg by mouth every 6 (six) hours as needed for allergies.    [provider]  HYDROcodone-acetaminophen (NORCO/VICODIN) 5-325 MG tablet Take 1 tablet by mouth every 4 (four) hours as needed for moderate pain. 04/03/17   Jill Alexanders, PA-C  ibuprofen (ADVIL,MOTRIN) 800 MG tablet Take 0.5 tablets (400 mg total) by mouth every 6 (six) hours as needed for mild pain or moderate pain. 04/03/17   Jill Alexanders, PA-C  loperamide (IMODIUM) 2 MG capsule Take 1 capsule (2 mg total) by mouth 4 (four) times daily as needed for diarrhea or loose stools. 04/06/17   Muthersbaugh, Jarrett Soho, PA-C  meloxicam (MOBIC) 15 MG tablet meloxicam 15 mg tablet  TK 1 T PO ONCE A DAY WITH A MEAL PRN    [provider]  methocarbamol (ROBAXIN) 500 MG tablet methocarbamol 500 mg tablet  TK 1 T PO TID FOR 10 DAYS PRN    [provider]  Multiple Vitamin (MULTIVITAMIN WITH MINERALS) TABS tablet Take 1 tablet by mouth daily.    [provider]  ondansetron (ZOFRAN ODT) 8 MG disintegrating tablet 8mg  ODT q4 hours prn nausea 04/06/17   Muthersbaugh, Jarrett Soho, PA-C    Family History History reviewed. No pertinent family history.  Social History Social History   Tobacco Use  . Smoking status: Never Smoker  . Smokeless tobacco: Never Used  Substance Use Topics  . Alcohol use: Yes    Comment: occ  . Drug use: No     Allergies   Patient has no known allergies.   Review of Systems Review of Systems   Physical Exam Updated Vital Signs BP 132/85 (BP Location: Right Arm)   Pulse (!) 106   Temp 98.4 F (36.9 C) (Oral)   Resp 16   Ht 5\' 3"  (1.6 m)   Wt 122.5 kg   LMP  (Within Weeks)   SpO2 100%   BMI 47.83 kg/m   Physical Exam Vitals signs and nursing note reviewed.  Constitutional:      Appearance: She is well-developed.  HENT:     Head: Normocephalic and atraumatic.  Eyes:     Conjunctiva/sclera: Conjunctivae normal.  Cardiovascular:     Rate and Rhythm: Tachycardia present.  Pulmonary:     Effort: Pulmonary effort is  normal.  Genitourinary:    Comments: Small skin lesion, appears to possibly have abnormal texture or verrucated texture. Lesion is 2-233mm in size, abnormal borders, though is partially avulsed therefore difficult to fully assess. Lesion is located in the left lateral mons pubis, along the anterior edge of the inguinal fold. Nontender. No vesicles or ulcerations. Not bleeding. Neurological:     Mental Status: She is alert.  Psychiatric:        Mood and Affect: Mood normal.        Behavior: Behavior normal.      ED Treatments / Results  Labs (all labs ordered are listed, but only abnormal results are displayed) Labs Reviewed - No data to display  EKG    Radiology No results found.  Procedures Procedures (including critical care time)  Medications  Ordered in ED Medications - No data to display   Initial Impression / Assessment and Plan / ED Course  I have reviewed the triage vital signs and the nursing notes.  Pertinent labs & imaging results that were available during my care of the patient were reviewed by me and considered in my medical decision making (see chart for details).       Patient presenting with very small skin lesion to the left inguinal region.  Due to size and the fact that it is partially avulsed, it is unable to fully assess, however the texture does not appear smooth.  Discussed possibility of condyloma the genital region, however unable to fully assess at this time.  Recommend patient continue monitoring, and follow-up with PCP versus OB/GYN versus dermatologist for further evaluation should it enlarge or multiply.  Patient agreeable to plan and safe for discharge.  Discussed results, findings, treatment and follow up. Patient advised of return precautions. Patient verbalized understanding and agreed with plan.  Final Clinical Impressions(s) / ED Diagnoses   Final diagnoses:  Skin change    ED Discharge Orders    None       Robinson, [place] N, PA-C 07/10/18 1633    Alvira MondaySchlossman, Erin, MD 07/11/18 1526

## 2018-07-10 NOTE — ED Notes (Signed)
ED Provider at bedside. 

## 2018-07-23 ENCOUNTER — Ambulatory Visit: Payer: Self-pay | Admitting: Family Medicine

## 2018-07-23 DIAGNOSIS — Z01419 Encounter for gynecological examination (general) (routine) without abnormal findings: Secondary | ICD-10-CM

## 2018-09-13 ENCOUNTER — Encounter (HOSPITAL_BASED_OUTPATIENT_CLINIC_OR_DEPARTMENT_OTHER): Payer: Self-pay | Admitting: Emergency Medicine

## 2018-09-13 ENCOUNTER — Other Ambulatory Visit: Payer: Self-pay

## 2018-09-13 ENCOUNTER — Emergency Department (HOSPITAL_BASED_OUTPATIENT_CLINIC_OR_DEPARTMENT_OTHER)
Admission: EM | Admit: 2018-09-13 | Discharge: 2018-09-13 | Disposition: A | Discharge status: Home / Self Care | Payer: PRIVATE HEALTH INSURANCE | Attending: Emergency Medicine | Admitting: Emergency Medicine

## 2018-09-13 ENCOUNTER — Emergency Department (HOSPITAL_BASED_OUTPATIENT_CLINIC_OR_DEPARTMENT_OTHER): Payer: PRIVATE HEALTH INSURANCE

## 2018-09-13 DIAGNOSIS — Z3A01 Less than 8 weeks gestation of pregnancy: Secondary | ICD-10-CM | POA: Insufficient documentation

## 2018-09-13 DIAGNOSIS — Z79899 Other long term (current) drug therapy: Secondary | ICD-10-CM | POA: Diagnosis not present

## 2018-09-13 DIAGNOSIS — O4691 Antepartum hemorrhage, unspecified, first trimester: Secondary | ICD-10-CM | POA: Insufficient documentation

## 2018-09-13 DIAGNOSIS — O2391 Unspecified genitourinary tract infection in pregnancy, first trimester: Secondary | ICD-10-CM | POA: Insufficient documentation

## 2018-09-13 DIAGNOSIS — O200 Threatened abortion: Secondary | ICD-10-CM | POA: Diagnosis not present

## 2018-09-13 DIAGNOSIS — B9689 Other specified bacterial agents as the cause of diseases classified elsewhere: Secondary | ICD-10-CM | POA: Diagnosis not present

## 2018-09-13 DIAGNOSIS — O4690 Antepartum hemorrhage, unspecified, unspecified trimester: Secondary | ICD-10-CM

## 2018-09-13 DIAGNOSIS — R8271 Bacteriuria: Secondary | ICD-10-CM

## 2018-09-13 LAB — URINALYSIS, ROUTINE W REFLEX MICROSCOPIC
Bilirubin Urine: NEGATIVE
Glucose, UA: NEGATIVE mg/dL
Ketones, ur: NEGATIVE mg/dL
Leukocytes,Ua: NEGATIVE
Nitrite: NEGATIVE
Protein, ur: NEGATIVE mg/dL
Specific Gravity, Urine: 1.02 (ref 1.005–1.030)
pH: 7 (ref 5.0–8.0)

## 2018-09-13 LAB — BASIC METABOLIC PANEL
Anion gap: 8 (ref 5–15)
BUN: 9 mg/dL (ref 6–20)
CO2: 23 mmol/L (ref 22–32)
Calcium: 9 mg/dL (ref 8.9–10.3)
Chloride: 106 mmol/L (ref 98–111)
Creatinine, Ser: 0.73 mg/dL (ref 0.44–1.00)
GFR calc Af Amer: >60 mL/min (ref >60)
GFR calc non Af Amer: >60 mL/min (ref >60)
Glucose, Bld: 113 mg/dL — ABNORMAL HIGH (ref 70–99)
Potassium: 3.8 mmol/L (ref 3.5–5.1)
Sodium: 137 mmol/L (ref 135–145)

## 2018-09-13 LAB — CBC
HCT: 37.7 % (ref 36.0–46.0)
Hemoglobin: 12 g/dL (ref 12.0–15.0)
MCH: 28.7 pg (ref 26.0–34.0)
MCHC: 31.8 g/dL (ref 30.0–36.0)
MCV: 90.2 fL (ref 80.0–100.0)
Platelets: 352 10*3/uL (ref 150–400)
RBC: 4.18 MIL/uL (ref 3.87–5.11)
RDW: 15.6 % — ABNORMAL HIGH (ref 11.5–15.5)
WBC: 11.1 10*3/uL — ABNORMAL HIGH (ref 4.0–10.5)
nRBC: 0 % (ref 0.0–0.2)

## 2018-09-13 LAB — URINALYSIS, MICROSCOPIC (REFLEX)

## 2018-09-13 LAB — HCG, QUANTITATIVE, PREGNANCY: hCG, Beta Chain, Quant, S: 754 m[IU]/mL — ABNORMAL HIGH (ref <5)

## 2018-09-13 LAB — PREGNANCY, URINE: Preg Test, Ur: POSITIVE — AB

## 2018-09-13 MED ORDER — CEPHALEXIN 500 MG PO CAPS: 500.0000 mg | ORAL_CAPSULE | Freq: Two times a day (BID) | ORAL | 0 refills | Status: DC

## 2018-09-13 NOTE — ED Triage Notes (Addendum)
Pt reports mild spotting last night with cramps. She states she thinks she is [redacted] weeks pregnant. Has not been seen by OB.

## 2018-09-13 NOTE — ED Provider Notes (Addendum)
[facility] EMERGENCY DEPARTMENT Provider Note   CSN: [id number] Arrival date & time: 09/13/18  1049     History   Chief Complaint Chief Complaint  Patient presents with   Vaginal Bleeding    HPI Miranda Mack is a 23 y.o. female.     HPI Patient presents with mild vaginal spotting.  Last night.  States she had a little blood when she wiped.  States it was more just pink discharge.  Was vaginal.  States she is around [redacted] weeks pregnant.  Middle of July was her last menses.  States she has had 3+ pregnancy test at home and a positive confirmatory test of the doctor.  She believes her blood type is O+ Past Medical History:  Diagnosis Date   GERD (gastroesophageal reflux disease)    Ovarian cyst     Patient Active Problem List   Diagnosis Date Noted   Appendicitis 04/01/2017    Past Surgical History:  Procedure Laterality Date   APPENDECTOMY     LAPAROSCOPIC APPENDECTOMY N/A 04/02/2017   Procedure: APPENDECTOMY LAPAROSCOPIC;  Surgeon: Darnell LevelGerkin, Todd, MD;  Location: [facility];  Service: General;  Laterality: N/A;     OB History    Gravida  1   Para      Term      Preterm      AB      Living        SAB      TAB      Ectopic      Multiple      Live Births               Home Medications    Prior to Admission medications   Medication Sig Start Date End Date Taking? Authorizing Provider  acetaminophen (TYLENOL) 325 MG tablet Take 2 tablets (650 mg total) by mouth every 6 (six) hours as needed for mild pain (or Fever >/= 101). 04/03/17   Simaan, Francine GravenElizabeth S, PA-C  cephALEXin (KEFLEX) 500 MG capsule Take 1 capsule (500 mg total) by mouth 2 (two) times daily. 09/13/18   Benjiman CorePickering, Licet Dunphy, MD  diphenhydrAMINE (BENADRYL) 25 mg capsule Take 25 mg by mouth every 6 (six) hours as needed for allergies.    [provider]  HYDROcodone-acetaminophen (NORCO/VICODIN) 5-325 MG tablet Take 1 tablet by mouth every 4 (four) hours as needed for  moderate pain. 04/03/17   Adam PhenixSimaan, Elizabeth S, PA-C  ibuprofen (ADVIL,MOTRIN) 800 MG tablet Take 0.5 tablets (400 mg total) by mouth every 6 (six) hours as needed for mild pain or moderate pain. 04/03/17   Adam PhenixSimaan, Elizabeth S, PA-C  loperamide (IMODIUM) 2 MG capsule Take 1 capsule (2 mg total) by mouth 4 (four) times daily as needed for diarrhea or loose stools. 04/06/17   Muthersbaugh, Dahlia ClientHannah, PA-C  meloxicam (MOBIC) 15 MG tablet meloxicam 15 mg tablet  TK 1 T PO ONCE A DAY WITH A MEAL PRN    [provider]  methocarbamol (ROBAXIN) 500 MG tablet methocarbamol 500 mg tablet  TK 1 T PO TID FOR 10 DAYS PRN    [provider]  Multiple Vitamin (MULTIVITAMIN WITH MINERALS) TABS tablet Take 1 tablet by mouth daily.    [provider]  ondansetron (ZOFRAN ODT) 8 MG disintegrating tablet 8mg  ODT q4 hours prn nausea 04/06/17   Muthersbaugh, Dahlia ClientHannah, PA-C    Family History No family history on file.  Social History Social History   Tobacco Use   Smoking status: Never Smoker  Smokeless tobacco: Never Used  Substance Use Topics   Alcohol use: Yes    Comment: occ   Drug use: No     Allergies   Patient has no known allergies.   Review of Systems Review of Systems   Physical Exam Updated Vital Signs BP 128/84 (BP Location: Left Arm)    Pulse (!) 104    Temp 99 F (37.2 C) (Oral)    Resp 18    Ht 5\' 2"  (1.575 m)    Wt 122.5 kg    LMP 08/01/2018    SpO2 100%    BMI 49.38 kg/m   Physical Exam Vitals signs and nursing note reviewed.  HENT:     Head: Normocephalic.  Neck:     Musculoskeletal: Neck supple.  Cardiovascular:     Rate and Rhythm: Normal rate and regular rhythm.  Pulmonary:     Breath sounds: Normal breath sounds.  Abdominal:     Tenderness: There is no abdominal tenderness.  Genitourinary:    Comments: Pelvic exam showed minimal bleeding from cervix.  No adnexal tenderness.  Os closed. Neurological:     Mental Status: She is alert.    Psychiatric:        Mood and Affect: Mood normal.      ED Treatments / Results  Labs (all labs ordered are listed, but only abnormal results are displayed) Labs Reviewed  PREGNANCY, URINE - Abnormal; Notable for the following components:      Result Value   Preg Test, Ur POSITIVE (*)    All other components within normal limits  URINALYSIS, ROUTINE W REFLEX MICROSCOPIC - Abnormal; Notable for the following components:   Hgb urine dipstick TRACE (*)    All other components within normal limits  URINALYSIS, MICROSCOPIC (REFLEX) - Abnormal; Notable for the following components:   Bacteria, UA MANY (*)    All other components within normal limits  HCG, QUANTITATIVE, PREGNANCY - Abnormal; Notable for the following components:   hCG, Beta Chain, Quant, S 754 (*)    All other components within normal limits  BASIC METABOLIC PANEL - Abnormal; Notable for the following components:   Glucose, Bld 113 (*)    All other components within normal limits  CBC - Abnormal; Notable for the following components:   WBC 11.1 (*)    RDW 15.6 (*)    All other components within normal limits    EKG None  Radiology US Ob Comp < 14 Wks  Result Date: 09/13/2018 CLINICAL DATA:  Pregnant patient with cramps in vaginal spotting. EXAM: OBSTETRIC <14 WK [place] AND TRANSVAGINAL OB US TECHNIQUE: Both transabdominal and transvaginal ultrasound examinations were performed for complete evaluation of the gestation as well as the maternal uterus, adnexal regions, and pelvic cul-de-sac. Transvaginal technique was performed to assess early pregnancy. COMPARISON:  None. FINDINGS: Intrauterine gestational sac: Single Yolk sac:  Not Visualized. Embryo:  Not Visualized. MSD: 4.8 mm   5 w   2 d CRL:    mm    w    d                  [place] EDC: Subchorionic hemorrhage:  None visualized. Maternal uterus/adnexae: The right ovary is not visualized. The left ovary is normal. Trace fluid in the pelvis is likely physiologic. IMPRESSION:  Probable early intrauterine gestational sac, but no yolk sac, fetal pole, or cardiac activity yet visualized. Recommend follow-up quantitative B-HCG levels and follow-up US in 14 days to assess viability.  This recommendation follows SRU consensus guidelines: Diagnostic Criteria for Nonviable Pregnancy Early in the First Trimester. [place] Med 2013; 161:0960-45. The right ovary is not visualized. The left ovary is normal in appearance. Electronically Signed   By: Dorise Bullion III M.D   On: 09/13/2018 13:13   US Ob Transvaginal  Result Date: 09/13/2018 CLINICAL DATA:  Pregnant patient with cramps in vaginal spotting. EXAM: OBSTETRIC <14 WK [place] AND TRANSVAGINAL OB US TECHNIQUE: Both transabdominal and transvaginal ultrasound examinations were performed for complete evaluation of the gestation as well as the maternal uterus, adnexal regions, and pelvic cul-de-sac. Transvaginal technique was performed to assess early pregnancy. COMPARISON:  None. FINDINGS: Intrauterine gestational sac: Single Yolk sac:  Not Visualized. Embryo:  Not Visualized. MSD: 4.8 mm   5 w   2 d CRL:    mm    w    d                  [place] EDC: Subchorionic hemorrhage:  None visualized. Maternal uterus/adnexae: The right ovary is not visualized. The left ovary is normal. Trace fluid in the pelvis is likely physiologic. IMPRESSION: Probable early intrauterine gestational sac, but no yolk sac, fetal pole, or cardiac activity yet visualized. Recommend follow-up quantitative B-HCG levels and follow-up US in 14 days to assess viability. This recommendation follows SRU consensus guidelines: Diagnostic Criteria for Nonviable Pregnancy Early in the First Trimester. [employer] Med 2013; 409:8119-14. The right ovary is not visualized. The left ovary is normal in appearance. Electronically Signed   By: Dorise Bullion III M.D   On: 09/13/2018 13:13    Procedures Procedures (including critical care time)  Medications Ordered in ED Medications - No data  to display   Initial Impression / Assessment and Plan / ED Course  I have reviewed the triage vital signs and the nursing notes.  Pertinent labs & imaging results that were available during my care of the patient were reviewed by me and considered in my medical decision making (see chart for details).        Patient with small amount of vaginal bleeding.  States she is about [redacted] weeks pregnant by date.  Had positive pregnancy test at home and it sounds like at health department.  States she is going to likely follow-up with [facility] clinic.  Ultrasound done here and showed probably early intrauterine gestational sac, however beta hCG is only 750.  This is rather low for 6 weeks.  Do not know which way it is trending.  We will follow-up as an outpatient.  Also has some bacteria in the urine which will be treated since she is pregnant.  Initial urine pregnancy test was falsely reported as negative by the lab.  It was changed later.  Final Clinical Impressions(s) / ED Diagnoses   Final diagnoses:  Vaginal bleeding in pregnancy  Bacteriuria during pregnancy in first trimester  Threatened miscarriage in early pregnancy    ED Discharge Orders         Ordered    cephALEXin (KEFLEX) 500 MG capsule  2 times daily     09/13/18 1330           Davonna Belling, MD 09/13/18 1333    Davonna Belling, MD 10/05/18 1525

## 2018-09-13 NOTE — ED Notes (Signed)
ED Provider at bedside. 

## 2018-09-13 NOTE — Discharge Instructions (Signed)
Follow-up in 2 to 3 days for recheck of the hCG level.  You also had some bacteria in the urine, which we will treat since you are pregnant.

## 2018-09-17 ENCOUNTER — Encounter: Payer: Self-pay | Admitting: Family Medicine

## 2018-09-17 ENCOUNTER — Ambulatory Visit (INDEPENDENT_AMBULATORY_CARE_PROVIDER_SITE_OTHER): Payer: PRIVATE HEALTH INSURANCE | Admitting: Family Medicine

## 2018-09-17 ENCOUNTER — Other Ambulatory Visit: Payer: Self-pay

## 2018-09-17 VITALS — BP 109/73 | HR 109 | Ht 62.0 in | Wt 285.1 lb

## 2018-09-17 DIAGNOSIS — O021 Missed abortion: Secondary | ICD-10-CM

## 2018-09-17 MED ORDER — MISOPROSTOL 200 MCG PO TABS: ORAL_TABLET | ORAL | 1 refills | Status: DC

## 2018-09-17 NOTE — Progress Notes (Signed)
   Subjective:    Patient ID: Miranda Mack, female    DOB: 10-08-1995, 23 y.o.   MRN: [id number]  HPI Patient seen for threatened miscarriage. Was seen in [facility] ED on 8/30 for positive pregnancy test and bleeding. HCG was 754 and empty gestational sac was seen on [place]. Sent home with threatened AB precautions. Went to [place] ED on 8/31 due to continued bleeding. Quant was 620 and was told to f.u with OB. No [place] tissue seen. Bleeding slightly improved.  I have reviewed the patients past medical, family, and social history.  I have reviewed the patient's medication list and allergies.  Review of Systems     Objective:   Physical Exam Constitutional:      Appearance: Normal appearance.  Cardiovascular:     Rate and Rhythm: Normal rate.     Pulses: Normal pulses.  Pulmonary:     Effort: Pulmonary effort is normal.  Abdominal:     General: Abdomen is flat.     Palpations: Abdomen is soft.     Tenderness: There is no abdominal tenderness.  Skin:    General: Skin is warm and dry.     Capillary Refill: Capillary refill takes less than 2 seconds.  Neurological:     Mental Status: She is alert.  Psychiatric:        Mood and Affect: Mood normal.        Behavior: Behavior normal.        Thought Content: Thought content normal.        Judgment: Judgment normal.       Assessment & Plan:  1. Missed abortion Drop in HCG sufficient for missed AB. Cytotec for missed AB. Discussed what to expect. F/u in 1 week.

## 2018-09-17 NOTE — Progress Notes (Signed)
Pt states that she has been having heavy bleeding x 5 days. Miranda Mack l [name], CMA

## 2018-09-25 ENCOUNTER — Ambulatory Visit (INDEPENDENT_AMBULATORY_CARE_PROVIDER_SITE_OTHER): Payer: PRIVATE HEALTH INSURANCE | Admitting: Family Medicine

## 2018-09-25 ENCOUNTER — Encounter: Payer: Self-pay | Admitting: Family Medicine

## 2018-09-25 ENCOUNTER — Other Ambulatory Visit: Payer: Self-pay

## 2018-09-25 VITALS — BP 115/81 | HR 90 | Ht 62.0 in | Wt 285.1 lb

## 2018-09-25 DIAGNOSIS — O021 Missed abortion: Secondary | ICD-10-CM | POA: Diagnosis not present

## 2018-09-25 NOTE — Progress Notes (Signed)
   Subjective:    Patient ID: Miranda Mack, female    DOB: [birth year], 23 y.o.   MRN: [id number]  HPI  Patient seen for follow-up of missed AB.  Patient took Cytotec and had some bleeding, but also had diarrhea and is unable to determine whether she saw any fetal tissue.  She is no longer having any bleeding and does not have any cramping or pelvic pain.  She otherwise feels fine.  Review of Systems     Objective:   Physical Exam Cardiovascular:     Rate and Rhythm: Normal rate.  Pulmonary:     Effort: Pulmonary effort is normal.  Abdominal:     General: Abdomen is flat. There is no distension.     Palpations: Abdomen is soft.     Tenderness: There is no abdominal tenderness. There is no guarding.  Neurological:     Mental Status: She is alert.       Assessment & Plan:  1. Missed abortion As unable to be certain that she has passed all of the fetal tissue, will obtain hCG.  If sufficiently drops, and will follow-up as needed.  If continues to be somewhat elevated, will need to redo Cytotec or have surgical management. - B-HCG Quant

## 2018-09-26 LAB — BETA HCG QUANT (REF LAB): hCG Quant: 1 m[IU]/mL

## 2018-09-28 ENCOUNTER — Telehealth: Payer: Self-pay

## 2018-09-28 NOTE — Telephone Encounter (Signed)
Pt called the office requesting Beta hCG Quant results. Pt made aware that her level is 1 and everything has passed. Per Dr. Nehemiah Settle, pt should let him know if she does not have her period over the next 6-8 weeks. Understanding was voiced.  Miranda Mack, CMA

## 2019-02-20 ENCOUNTER — Other Ambulatory Visit: Payer: Self-pay

## 2019-02-20 ENCOUNTER — Encounter (HOSPITAL_BASED_OUTPATIENT_CLINIC_OR_DEPARTMENT_OTHER): Payer: Self-pay | Admitting: *Deleted

## 2019-02-20 ENCOUNTER — Emergency Department (HOSPITAL_BASED_OUTPATIENT_CLINIC_OR_DEPARTMENT_OTHER)
Admission: EM | Admit: 2019-02-20 | Discharge: 2019-02-21 | Disposition: A | Discharge status: Home / Self Care | Payer: Medicaid Other | Attending: Emergency Medicine | Admitting: Emergency Medicine

## 2019-02-20 DIAGNOSIS — R102 Pelvic and perineal pain: Secondary | ICD-10-CM | POA: Insufficient documentation

## 2019-02-20 DIAGNOSIS — Z79899 Other long term (current) drug therapy: Secondary | ICD-10-CM | POA: Insufficient documentation

## 2019-02-20 DIAGNOSIS — R112 Nausea with vomiting, unspecified: Secondary | ICD-10-CM | POA: Insufficient documentation

## 2019-02-20 DIAGNOSIS — Z113 Encounter for screening for infections with a predominantly sexual mode of transmission: Secondary | ICD-10-CM | POA: Diagnosis not present

## 2019-02-20 DIAGNOSIS — R1032 Left lower quadrant pain: Secondary | ICD-10-CM | POA: Diagnosis present

## 2019-02-20 HISTORY — DX: Personal history of other complications of pregnancy, childbirth and the puerperium: Z87.59

## 2019-02-20 LAB — WET PREP, GENITAL
Clue Cells Wet Prep HPF POC: NONE SEEN
Sperm: NONE SEEN
Trich, Wet Prep: NONE SEEN
Yeast Wet Prep HPF POC: NONE SEEN

## 2019-02-20 LAB — URINALYSIS, ROUTINE W REFLEX MICROSCOPIC
Glucose, UA: NEGATIVE mg/dL
Hgb urine dipstick: NEGATIVE
Ketones, ur: 15 mg/dL — AB
Leukocytes,Ua: NEGATIVE
Nitrite: NEGATIVE
Protein, ur: NEGATIVE mg/dL
Specific Gravity, Urine: >1.03 — ABNORMAL HIGH (ref 1.005–1.030)
pH: 6 (ref 5.0–8.0)

## 2019-02-20 LAB — PREGNANCY, URINE: Preg Test, Ur: NEGATIVE

## 2019-02-20 NOTE — ED Triage Notes (Signed)
Pt reports left side pelvic pain x 2 hours. Denies vaginal d/c or bleeding. She has hx of ovarian cysts

## 2019-02-20 NOTE — ED Notes (Signed)
Pt ambulated to restroom to provide urine specimen.

## 2019-02-20 NOTE — ED Provider Notes (Signed)
[facility] EMERGENCY DEPARTMENT Provider Note   CSN: [id number] Arrival date & time: 02/20/19  2133     History Chief Complaint  Patient presents with  . Abdominal Pain    Miranda Mack is a 24 y.o. female.  Patient is a 24 year old female who presents with left lower quadrant abdominal pain/pelvic pain. She says it started about 2 hours prior to arrival has been intermittent since that time. She has some nausea but no vomiting. No fevers. She had a little bit of diarrhea that started earlier today. No urinary symptoms. No vaginal bleeding or discharge. She says her last menstrual period was January 28. She does have a history of an ovarian cyst but cannot remember what the pain felt like related to that. She has not taken anything at home for the pain.        Past Medical History:  Diagnosis Date  . GERD (gastroesophageal reflux disease)   . Miscarriage within last 12 months   . Ovarian cyst     Patient Active Problem List   Diagnosis Date Noted  . Appendicitis 04/01/2017    Past Surgical History:  Procedure Laterality Date  . APPENDECTOMY    . LAPAROSCOPIC APPENDECTOMY N/A 04/02/2017   Procedure: APPENDECTOMY LAPAROSCOPIC;  Surgeon: Armandina Gemma, MD;  Location: [facility];  Service: General;  Laterality: N/A;     OB History    Gravida  1   Para      Term      Preterm      AB      Living        SAB      TAB      Ectopic      Multiple      Live Births              Family History  Problem Relation Age of Onset  . Diabetes Father   . Hypertension Father   . Diabetes Mother     Social History   Tobacco Use  . Smoking status: Never Smoker  . Smokeless tobacco: Never Used  Substance Use Topics  . Alcohol use: Yes    Comment: occ  . Drug use: No    Home Medications Prior to Admission medications   Medication Sig Start Date End Date Taking? Authorizing Provider  citalopram (CELEXA) 40 MG tablet Take 1 tablet by mouth once  daily 02/02/19  Yes [provider]  acetaminophen (TYLENOL) 325 MG tablet Take 2 tablets (650 mg total) by mouth every 6 (six) hours as needed for mild pain (or Fever >/= 101). 04/03/17   Simaan, Darci Current, PA-C  cephALEXin (KEFLEX) 500 MG capsule Take 1 capsule (500 mg total) by mouth 2 (two) times daily. 09/13/18   Davonna Belling, MD  diphenhydrAMINE (BENADRYL) 25 mg capsule Take 25 mg by mouth every 6 (six) hours as needed for allergies.    [provider]  HYDROcodone-acetaminophen (NORCO/VICODIN) 5-325 MG tablet Take 1 tablet by mouth every 4 (four) hours as needed for moderate pain. Patient not taking: Reported on 09/17/2018 04/03/17   Jill Alexanders, PA-C  ibuprofen (ADVIL,MOTRIN) 800 MG tablet Take 0.5 tablets (400 mg total) by mouth every 6 (six) hours as needed for mild pain or moderate pain. Patient not taking: Reported on 09/17/2018 04/03/17   Jill Alexanders, PA-C  loperamide (IMODIUM) 2 MG capsule Take 1 capsule (2 mg total) by mouth 4 (four) times daily as needed for diarrhea or loose stools. Patient  not taking: Reported on 09/17/2018 04/06/17   Muthersbaugh, Dahlia Client, PA-C  meloxicam (MOBIC) 15 MG tablet meloxicam 15 mg tablet  TK 1 T PO ONCE A DAY WITH A MEAL PRN    [provider]  methocarbamol (ROBAXIN) 500 MG tablet methocarbamol 500 mg tablet  TK 1 T PO TID FOR 10 DAYS PRN    [provider]  misoprostol (CYTOTEC) 200 MCG tablet Place four tablets in between your gums and cheeks (two tablets on each side) as instructed Patient not taking: Reported on 09/25/2018 09/17/18   Levie Heritage, DO  Multiple Vitamin (MULTIVITAMIN WITH MINERALS) TABS tablet Take 1 tablet by mouth daily.    [provider]  ondansetron (ZOFRAN ODT) 8 MG disintegrating tablet 8mg  ODT q4 hours prn nausea Patient not taking: Reported on 09/17/2018 04/06/17   Muthersbaugh, 04/08/17, PA-C    Allergies    Patient has no known allergies.  Review of Systems     Review of Systems  Constitutional: Negative for chills, diaphoresis, fatigue and fever.  HENT: Negative for congestion, rhinorrhea and sneezing.   Eyes: Negative.   Respiratory: Negative for cough, chest tightness and shortness of breath.   Cardiovascular: Negative for chest pain and leg swelling.  Gastrointestinal: Positive for nausea. Negative for abdominal pain, blood in stool, diarrhea and vomiting.  Genitourinary: Positive for pelvic pain. Negative for difficulty urinating, flank pain, frequency and hematuria.  Musculoskeletal: Negative for arthralgias and back pain.  Skin: Negative for rash.  Neurological: Negative for dizziness, speech difficulty, weakness, numbness and headaches.    Physical Exam Updated Vital Signs BP 108/70 (BP Location: Right Wrist)   Pulse 98   Temp 98.1 F (36.7 C) (Oral)   Resp 16   Ht 5\' 3"  (1.6 m)   Wt 127 kg   LMP 02/11/2019   SpO2 99%   Breastfeeding No   BMI 49.60 kg/m   Physical Exam Constitutional:      Appearance: She is well-developed.  HENT:     Head: Normocephalic and atraumatic.  Eyes:     Pupils: Pupils are equal, round, and reactive to light.  Cardiovascular:     Rate and Rhythm: Normal rate and regular rhythm.     Heart sounds: Normal heart sounds.  Pulmonary:     Effort: Pulmonary effort is normal. No respiratory distress.     Breath sounds: Normal breath sounds. No wheezing or rales.  Chest:     Chest wall: No tenderness.  Abdominal:     General: Bowel sounds are normal.     Palpations: Abdomen is soft.     Tenderness: There is abdominal tenderness in the left lower quadrant. There is no guarding or rebound.  Genitourinary:    Vagina: Normal.     Cervix: No cervical motion tenderness or discharge.     Adnexa:        Left: Tenderness present.   Musculoskeletal:        General: Normal range of motion.     Cervical back: Normal range of motion and neck supple.  Lymphadenopathy:     Cervical: No cervical adenopathy.   Skin:    General: Skin is warm and dry.     Findings: No rash.  Neurological:     Mental Status: She is alert and oriented to person, place, and time.     ED Results / Procedures / Treatments   Labs (all labs ordered are listed, but only abnormal results are displayed) Labs Reviewed  WET PREP,  GENITAL - Abnormal; Notable for the following components:      Result Value   WBC, Wet Prep HPF POC MODERATE (*)    All other components within normal limits  URINALYSIS, ROUTINE W REFLEX MICROSCOPIC - Abnormal; Notable for the following components:   APPearance HAZY (*)    Specific Gravity, Urine >1.030 (*)    Bilirubin Urine SMALL (*)    Ketones, ur 15 (*)    All other components within normal limits  PREGNANCY, URINE  GC/CHLAMYDIA PROBE AMP ([place]) NOT AT [facility]    EKG None  Radiology No results found.  Procedures Procedures (including critical care time)  Medications Ordered in ED Medications - No data to display  ED Course  I have reviewed the triage vital signs and the nursing notes.  Pertinent labs & imaging results that were available during my care of the patient were reviewed by me and considered in my medical decision making (see chart for details).    MDM Rules/Calculators/A&P                      Patient is a 24 year old female who presents with left-sided pelvic pain.  She had some left adnexal tenderness on pelvic exam.  Her pregnancy test is negative.  She will need a pelvic ultrasound although unable to do that in med center tonight.  I gave her an option of ordering it as an outpatient in the morning or having her go to another facility to have ultrasound.  I feel that she has a lower likelihood of torsion given her exam although not completely excluded.  She opts to be transferred to another facility.  I spoke with Dr. Juleen China at [facility] who is excepted the patient for transfer.  She will drive POV. Final Clinical Impression(s) / ED Diagnoses Final  diagnoses:  Pelvic pain in female    Rx / DC Orders ED Discharge Orders    None       Rolan Bucco, MD 02/20/19 2253

## 2019-02-21 ENCOUNTER — Emergency Department (HOSPITAL_COMMUNITY): Payer: Medicaid Other

## 2019-02-21 MED ORDER — ALUM & MAG HYDROXIDE-SIMETH 200-200-20 MG/5ML PO SUSP
30.0000 mL | Freq: Once | ORAL | Status: AC
  Administered 2019-02-21: 30 mL via ORAL
  Filled 2019-02-21: qty 30

## 2019-02-21 MED ORDER — ACETAMINOPHEN 500 MG PO TABS
1000.0000 mg | ORAL_TABLET | Freq: Once | ORAL | Status: AC
  Administered 2019-02-21: 01:00:00 1000 mg via ORAL
  Filled 2019-02-21: qty 2

## 2019-02-21 NOTE — ED Provider Notes (Addendum)
MSE was initiated and I personally evaluated the patient and placed orders (if any) at  12:39 AM on February 21, 2019.  The patient appears stable so that the remainder of the MSE may be completed by another provider.  Awaiting [place]  NCAT PERRL RRR CTAB Gassy on exam, soft non tender.   Results for orders placed or performed during the hospital encounter of 02/20/19  Wet prep, genital  Result Value Ref Range   Yeast Wet Prep HPF POC NONE SEEN NONE SEEN   Trich, Wet Prep NONE SEEN NONE SEEN   Clue Cells Wet Prep HPF POC NONE SEEN NONE SEEN   WBC, Wet Prep HPF POC MODERATE (A) NONE SEEN   Sperm NONE SEEN   Urinalysis, Routine w reflex microscopic  Result Value Ref Range   Color, Urine YELLOW YELLOW   APPearance HAZY (A) CLEAR   Specific Gravity, Urine >1.030 (H) 1.005 - 1.030   pH 6.0 5.0 - 8.0   Glucose, UA NEGATIVE NEGATIVE mg/dL   Hgb urine dipstick NEGATIVE NEGATIVE   Bilirubin Urine SMALL (A) NEGATIVE   Ketones, ur 15 (A) NEGATIVE mg/dL   Protein, ur NEGATIVE NEGATIVE mg/dL   Nitrite NEGATIVE NEGATIVE   Leukocytes,Ua NEGATIVE NEGATIVE  Pregnancy, urine  Result Value Ref Range   Preg Test, Ur NEGATIVE NEGATIVE   US Transvaginal Non-OB  Result Date: 02/21/2019 CLINICAL DATA:  Left-sided abdominal pain EXAM: TRANSABDOMINAL AND TRANSVAGINAL ULTRASOUND OF PELVIS DOPPLER ULTRASOUND OF OVARIES TECHNIQUE: Both transabdominal and transvaginal ultrasound examinations of the pelvis were performed. Transabdominal technique was performed for global imaging of the pelvis including uterus, ovaries, adnexal regions, and pelvic cul-de-sac. It was necessary to proceed with endovaginal exam following the transabdominal exam to visualize the ovaries. Color and duplex Doppler ultrasound was utilized to evaluate blood flow to the ovaries. COMPARISON:  09/13/2018 FINDINGS: Uterus Measurements: 8.6 x 2.9 x 4.7 cm = volume: 60 mL. No fibroids or other mass visualized. Endometrium Thickness: 9.2 mm.  No  focal abnormality visualized. Right ovary Measurements: 2.5 x 1.5 x 2.4 cm = volume: 4.5 mL. Normal appearance/no adnexal mass. Left ovary Measurements: 2.7 x 1.6 x 2.1 cm = volume: 0.7 mL. Normal appearance/no adnexal mass. Pulsed Doppler evaluation of both ovaries demonstrates normal low-resistance arterial and venous waveforms. Other findings There is a trace amount of free fluid in the patient's pelvis, likely physiologic. IMPRESSION: Normal study.  No acute abnormality. Electronically Signed   By: Constance Holster M.D.   On: 02/21/2019 02:30   US Pelvis Complete  Result Date: 02/21/2019 CLINICAL DATA:  Left-sided abdominal pain EXAM: TRANSABDOMINAL AND TRANSVAGINAL ULTRASOUND OF PELVIS DOPPLER ULTRASOUND OF OVARIES TECHNIQUE: Both transabdominal and transvaginal ultrasound examinations of the pelvis were performed. Transabdominal technique was performed for global imaging of the pelvis including uterus, ovaries, adnexal regions, and pelvic cul-de-sac. It was necessary to proceed with endovaginal exam following the transabdominal exam to visualize the ovaries. Color and duplex Doppler ultrasound was utilized to evaluate blood flow to the ovaries. COMPARISON:  09/13/2018 FINDINGS: Uterus Measurements: 8.6 x 2.9 x 4.7 cm = volume: 60 mL. No fibroids or other mass visualized. Endometrium Thickness: 9.2 mm.  No focal abnormality visualized. Right ovary Measurements: 2.5 x 1.5 x 2.4 cm = volume: 4.5 mL. Normal appearance/no adnexal mass. Left ovary Measurements: 2.7 x 1.6 x 2.1 cm = volume: 0.7 mL. Normal appearance/no adnexal mass. Pulsed Doppler evaluation of both ovaries demonstrates normal low-resistance arterial and venous waveforms. Other findings There is a trace amount  of free fluid in the patient's pelvis, likely physiologic. IMPRESSION: Normal study.  No acute abnormality. Electronically Signed   By: Katherine Mantle M.D.   On: 02/21/2019 02:30   [place] Art/Ven Flow Abd Pelv Doppler  Result Date:  02/21/2019 CLINICAL DATA:  Left-sided abdominal pain EXAM: TRANSABDOMINAL AND TRANSVAGINAL ULTRASOUND OF PELVIS DOPPLER ULTRASOUND OF OVARIES TECHNIQUE: Both transabdominal and transvaginal ultrasound examinations of the pelvis were performed. Transabdominal technique was performed for global imaging of the pelvis including uterus, ovaries, adnexal regions, and pelvic cul-de-sac. It was necessary to proceed with endovaginal exam following the transabdominal exam to visualize the ovaries. Color and duplex Doppler ultrasound was utilized to evaluate blood flow to the ovaries. COMPARISON:  09/13/2018 FINDINGS: Uterus Measurements: 8.6 x 2.9 x 4.7 cm = volume: 60 mL. No fibroids or other mass visualized. Endometrium Thickness: 9.2 mm.  No focal abnormality visualized. Right ovary Measurements: 2.5 x 1.5 x 2.4 cm = volume: 4.5 mL. Normal appearance/no adnexal mass. Left ovary Measurements: 2.7 x 1.6 x 2.1 cm = volume: 0.7 mL. Normal appearance/no adnexal mass. Pulsed Doppler evaluation of both ovaries demonstrates normal low-resistance arterial and venous waveforms. Other findings There is a trace amount of free fluid in the patient's pelvis, likely physiologic. IMPRESSION: Normal study.  No acute abnormality. Electronically Signed   By: Katherine Mantle M.D.   On: 02/21/2019 02:30    No ovarian cysts.  Exam is benign and reassuring.  Follow up with your doctor for ongoing care  Grayling Schranz, MD 02/21/19 0040    Itzelle Gains, MD 02/21/19 1941

## 2019-02-23 LAB — GC/CHLAMYDIA PROBE AMP (~~LOC~~) NOT AT ARMC
Chlamydia: NEGATIVE
Neisseria Gonorrhea: NEGATIVE

## 2019-05-24 ENCOUNTER — Emergency Department (HOSPITAL_BASED_OUTPATIENT_CLINIC_OR_DEPARTMENT_OTHER)
Admission: EM | Admit: 2019-05-24 | Discharge: 2019-05-24 | Disposition: A | Discharge status: Home / Self Care | Payer: Medicaid Other | Attending: Emergency Medicine | Admitting: Emergency Medicine

## 2019-05-24 ENCOUNTER — Encounter (HOSPITAL_BASED_OUTPATIENT_CLINIC_OR_DEPARTMENT_OTHER): Payer: Self-pay

## 2019-05-24 ENCOUNTER — Other Ambulatory Visit: Payer: Self-pay

## 2019-05-24 ENCOUNTER — Emergency Department (HOSPITAL_BASED_OUTPATIENT_CLINIC_OR_DEPARTMENT_OTHER): Payer: Medicaid Other

## 2019-05-24 DIAGNOSIS — Z3A01 Less than 8 weeks gestation of pregnancy: Secondary | ICD-10-CM | POA: Diagnosis not present

## 2019-05-24 DIAGNOSIS — O99891 Other specified diseases and conditions complicating pregnancy: Secondary | ICD-10-CM | POA: Diagnosis present

## 2019-05-24 DIAGNOSIS — R1084 Generalized abdominal pain: Secondary | ICD-10-CM | POA: Diagnosis not present

## 2019-05-24 DIAGNOSIS — R109 Unspecified abdominal pain: Secondary | ICD-10-CM

## 2019-05-24 DIAGNOSIS — E349 Endocrine disorder, unspecified: Secondary | ICD-10-CM

## 2019-05-24 LAB — WET PREP, GENITAL
Clue Cells Wet Prep HPF POC: NONE SEEN
Sperm: NONE SEEN
Trich, Wet Prep: NONE SEEN
Yeast Wet Prep HPF POC: NONE SEEN

## 2019-05-24 LAB — URINALYSIS, ROUTINE W REFLEX MICROSCOPIC
Bilirubin Urine: NEGATIVE
Glucose, UA: NEGATIVE mg/dL
Hgb urine dipstick: NEGATIVE
Ketones, ur: NEGATIVE mg/dL
Leukocytes,Ua: NEGATIVE
Nitrite: NEGATIVE
Protein, ur: NEGATIVE mg/dL
Specific Gravity, Urine: 1.02 (ref 1.005–1.030)
pH: 7 (ref 5.0–8.0)

## 2019-05-24 LAB — PREGNANCY, URINE: Preg Test, Ur: POSITIVE — AB

## 2019-05-24 LAB — HCG, QUANTITATIVE, PREGNANCY: hCG, Beta Chain, Quant, S: 63 m[IU]/mL — ABNORMAL HIGH (ref <5)

## 2019-05-24 NOTE — Discharge Instructions (Addendum)
It is important that you follow-up with your OB/GYN for trending of your hCG (pregnancy hormone). Return to the emergency room if you develop fevers, severe worsening pain, or any new, worsening, or concerning symptoms.

## 2019-05-24 NOTE — ED Provider Notes (Addendum)
[facility] EMERGENCY DEPARTMENT Provider Note   CSN: [id number] Arrival date & time: 05/24/19  1338     History Chief Complaint  Patient presents with  . Abdominal Cramping    Miranda Mack is a 24 y.o. female presenting for evaluation of abdominal cramping.  Patient states she recently found out that she is pregnant.  For the past day, she has been having lower abdominal cramping.  She denies vaginal bleeding.  She states today she noticed vaginal discharge.  She denies fevers, chills, nausea, vomiting, upper abdominal pain, urinary symptoms, abnormal bowel movements.  Her last period was April 2, it was normal for her.  She reports she had a miscarriage about 6 months ago around [redacted] weeks along, she did not need rhogam at that time.  She reports no other medical problems, takes medications daily. She has an apt with her ob/gyn in 2 wks.   HPI     Past Medical History:  Diagnosis Date  . GERD (gastroesophageal reflux disease)   . Miscarriage within last 12 months   . Ovarian cyst     Patient Active Problem List   Diagnosis Date Noted  . Appendicitis 04/01/2017    Past Surgical History:  Procedure Laterality Date  . APPENDECTOMY    . LAPAROSCOPIC APPENDECTOMY N/A 04/02/2017   Procedure: APPENDECTOMY LAPAROSCOPIC;  Surgeon: Darnell Level, MD;  Location: [facility];  Service: General;  Laterality: N/A;     OB History    Gravida  1   Para      Term      Preterm      AB      Living        SAB      TAB      Ectopic      Multiple      Live Births              Family History  Problem Relation Age of Onset  . Diabetes Father   . Hypertension Father   . Diabetes Mother     Social History   Tobacco Use  . Smoking status: Never Smoker  . Smokeless tobacco: Never Used  Substance Use Topics  . Alcohol use: Yes    Comment: occ  . Drug use: No    Home Medications Prior to Admission medications   Medication Sig Start Date End Date Taking?  Authorizing Provider  acetaminophen (TYLENOL) 325 MG tablet Take 2 tablets (650 mg total) by mouth every 6 (six) hours as needed for mild pain (or Fever >/= 101). 04/03/17   Simaan, Francine Graven, PA-C  cephALEXin (KEFLEX) 500 MG capsule Take 1 capsule (500 mg total) by mouth 2 (two) times daily. 09/13/18   Benjiman Core, MD  citalopram (CELEXA) 40 MG tablet Take 1 tablet by mouth once daily 02/02/19   [provider]  diphenhydrAMINE (BENADRYL) 25 mg capsule Take 25 mg by mouth every 6 (six) hours as needed for allergies.    [provider]  HYDROcodone-acetaminophen (NORCO/VICODIN) 5-325 MG tablet Take 1 tablet by mouth every 4 (four) hours as needed for moderate pain. Patient not taking: Reported on 09/17/2018 04/03/17   Adam Phenix, PA-C  ibuprofen (ADVIL,MOTRIN) 800 MG tablet Take 0.5 tablets (400 mg total) by mouth every 6 (six) hours as needed for mild pain or moderate pain. Patient not taking: Reported on 09/17/2018 04/03/17   Adam Phenix, PA-C  loperamide (IMODIUM) 2 MG capsule Take 1 capsule (2 mg total)  by mouth 4 (four) times daily as needed for diarrhea or loose stools. Patient not taking: Reported on 09/17/2018 04/06/17   Muthersbaugh, Jarrett Soho, PA-C  meloxicam (MOBIC) 15 MG tablet meloxicam 15 mg tablet  TK 1 T PO ONCE A DAY WITH A MEAL PRN    [provider]  methocarbamol (ROBAXIN) 500 MG tablet methocarbamol 500 mg tablet  TK 1 T PO TID FOR 10 DAYS PRN    [provider]  misoprostol (CYTOTEC) 200 MCG tablet Place four tablets in between your gums and cheeks (two tablets on each side) as instructed Patient not taking: Reported on 09/25/2018 09/17/18   [name], DO  Multiple Vitamin (MULTIVITAMIN WITH MINERALS) TABS tablet Take 1 tablet by mouth daily.    [provider]  ondansetron (ZOFRAN ODT) 8 MG disintegrating tablet 8mg  ODT q4 hours prn nausea Patient not taking: Reported on 09/17/2018 04/06/17   Muthersbaugh, Jarrett Soho, PA-C      Allergies    Patient has no known allergies.  Review of Systems   Review of Systems  Genitourinary:       Pelvic cramping  All other systems reviewed and are negative.   Physical Exam Updated Vital Signs BP 117/62 (BP Location: Right Arm)   Pulse 80   Temp 98.5 F (36.9 C) (Oral)   Resp 16   Wt (!) 137.9 kg   SpO2 100%   BMI 53.85 kg/m   Physical Exam Vitals and nursing note reviewed. Exam conducted with a chaperone present.  Constitutional:      General: She is not in acute distress.    Appearance: She is well-developed. She is obese.     Comments: Obese female who appears nontoxic  HENT:     Head: Normocephalic and atraumatic.  Eyes:     Extraocular Movements: Extraocular movements intact.     Conjunctiva/sclera: Conjunctivae normal.     Pupils: Pupils are equal, round, and reactive to light.  Cardiovascular:     Rate and Rhythm: Normal rate and regular rhythm.     Pulses: Normal pulses.  Pulmonary:     Effort: Pulmonary effort is normal. No respiratory distress.     Breath sounds: Normal breath sounds. No wheezing.  Abdominal:     General: There is no distension.     Palpations: Abdomen is soft. There is no mass.     Tenderness: There is no abdominal tenderness. There is no guarding or rebound.     Comments: No ttp of the abd  Genitourinary:    Vagina: Normal.     Cervix: Normal.     Uterus: Normal.      Adnexa: Right adnexa normal and left adnexa normal.     Comments: No discharge noted on exam. No bleeding. No cmt or adnexal tenderness.  Musculoskeletal:        General: Normal range of motion.     Cervical back: Normal range of motion and neck supple.  Skin:    General: Skin is warm and dry.     Capillary Refill: Capillary refill takes less than 2 seconds.  Neurological:     Mental Status: She is alert and oriented to person, place, and time.     ED Results / Procedures / Treatments   Labs (all labs ordered are listed, but only abnormal  results are displayed) Labs Reviewed  WET PREP, GENITAL - Abnormal; Notable for the following components:      Result Value   WBC, Wet Prep HPF POC  MODERATE (*)    All other components within normal limits  PREGNANCY, URINE - Abnormal; Notable for the following components:   Preg Test, Ur POSITIVE (*)    All other components within normal limits  HCG, QUANTITATIVE, PREGNANCY - Abnormal; Notable for the following components:   hCG, Beta Chain, Quant, S 63 (*)    All other components within normal limits  URINALYSIS, ROUTINE W REFLEX MICROSCOPIC  GC/CHLAMYDIA PROBE AMP ([place]) NOT AT [facility]    EKG None  Radiology US OB LESS THAN 14 WEEKS WITH OB TRANSVAGINAL  Result Date: 05/24/2019 CLINICAL DATA:  Abdominal cramping. EXAM: OBSTETRIC <14 WK [place] AND TRANSVAGINAL OB US TECHNIQUE: Both transabdominal and transvaginal ultrasound examinations were performed for complete evaluation of the gestation as well as the maternal uterus, adnexal regions, and pelvic cul-de-sac. Transvaginal technique was performed to assess early pregnancy. COMPARISON:  None. FINDINGS: Intrauterine gestational sac: None Yolk sac:  Not Visualized. Embryo:  Not Visualized. Cardiac Activity: Not Visualized. Heart Rate: N/A  bpm Subchorionic hemorrhage:  None visualized. Maternal uterus/adnexae: A right ovarian cyst is seen (ultrasonographic measurements not provided). IMPRESSION: 1. No evidence of an intrauterine pregnancy. While this may be secondary to early pregnancy, correlation with follow-up pelvic ultrasound is recommended. 2. Right ovarian cyst. Electronically Signed   By: Aram Candela M.D.   On: 05/24/2019 18:10    Procedures Procedures (including critical care time)  Medications Ordered in ED Medications - No data to display  ED Course  I have reviewed the triage vital signs and the nursing notes.  Pertinent labs & imaging results that were available during my care of the patient were reviewed by me  and considered in my medical decision making (see chart for details).    MDM Rules/Calculators/A&P                      Pt presenting for evaluation of lower abdominal cramping in the setting of positive pregnancy test.  On exam, patient appears nontoxic.  She has no abdominal tenderness.  GU exam shows no significant discharge or bleeding.  hCG quant is low, 63.  Unsure if quant is rising or decreasing.  Pelvic ultrasound does not show IUP, but also does not show ectopic.  Discussed findings with patient.  Discussed importance of follow-up with OB/GYN for hCG trending.  Discussed that she is either too early to have an IUP, or that she could possibly be having a miscarriage.  At this time, patient appears safe for discharge.  Return precautions given.  Patient states she understands and agrees to plan.  Final Clinical Impression(s) / ED Diagnoses Final diagnoses:  Abdominal cramping  Elevated serum hCG    Rx / DC Orders ED Discharge Orders    None       Alveria Apley, PA-C 05/24/19 1847    Alveria Apley, PA-C 05/24/19 1847    Milagros Loll, MD 05/26/19 [phone]

## 2019-05-24 NOTE — ED Triage Notes (Signed)
Pt c/o abd cramping x today with +home preg test-no medical f/u-NAD-steady gait

## 2019-05-25 LAB — GC/CHLAMYDIA PROBE AMP (~~LOC~~) NOT AT ARMC
Chlamydia: NEGATIVE
Comment: NEGATIVE
Comment: NORMAL
Neisseria Gonorrhea: NEGATIVE

## 2019-05-30 IMAGING — CT CT ABD-PELV W/ CM
2 of 4 series · 16 of 46 positions shown, 18 images · IV contrast (APPLIED)
Comparison: CT 05/17/2013

CLINICAL DATA: Right lower quadrant pain

EXAM:
CT ABDOMEN AND PELVIS WITH CONTRAST
TECHNIQUE: Multidetector CT imaging of the abdomen and pelvis was performed
using the standard protocol following bolus administration of
intravenous contrast.
CONTRAST:  100mL TRL6MT-JOO IOPAMIDOL (TRL6MT-JOO) INJECTION 61%

[Series 2: axial st · axial · 0.89mm/px · z∈[+837,+1257]mm · 13 of 94 slices shown, 15 images]
[im 5/94  soft-tissue]
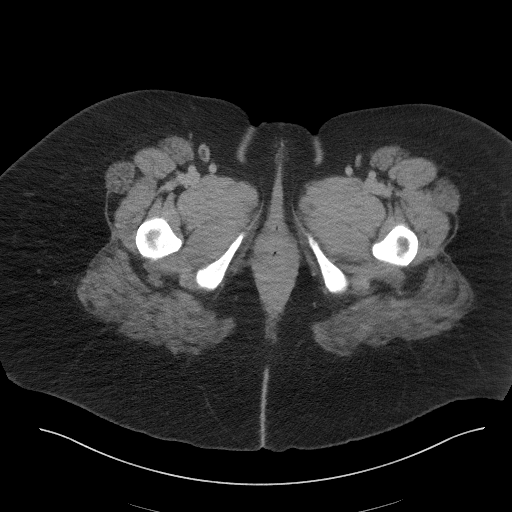
[im 5/94  bone]
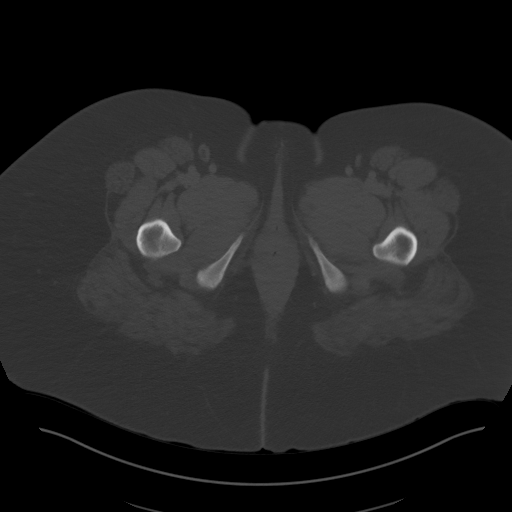
[im 13/94  soft-tissue]
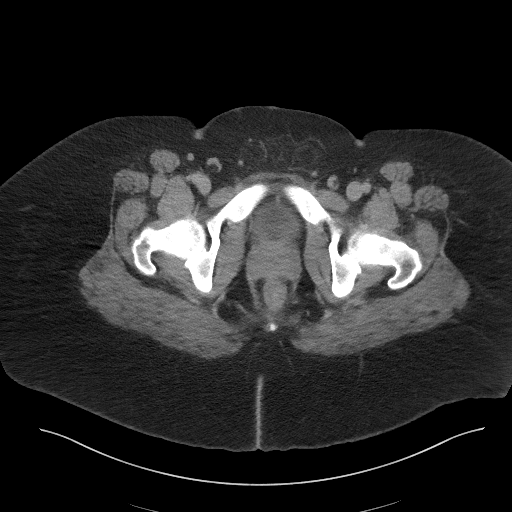
[im 21/94  soft-tissue]
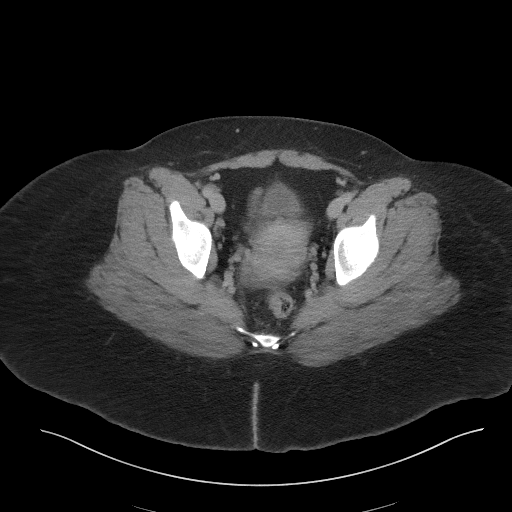
[im 25/94  soft-tissue]
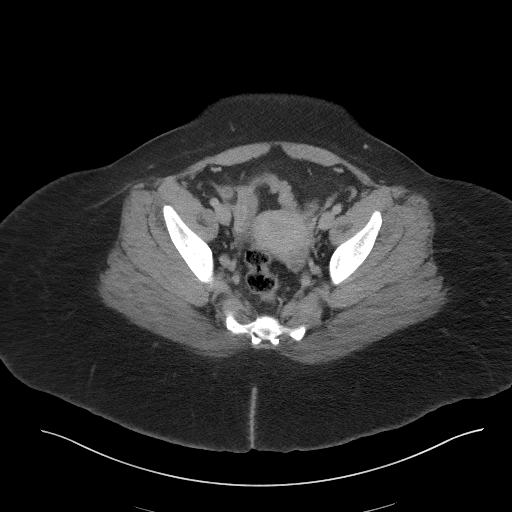
[im 33/94  soft-tissue]
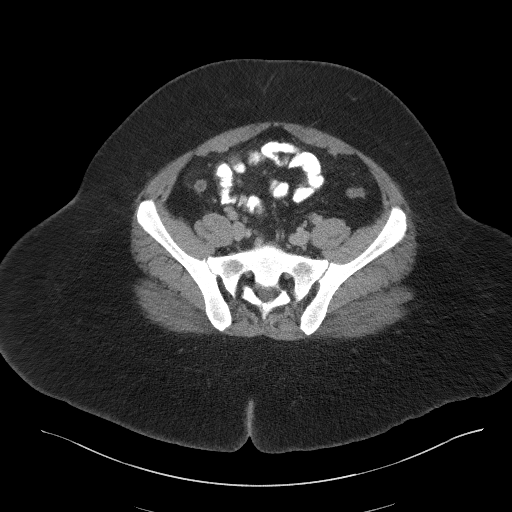
[im 41/94  soft-tissue]
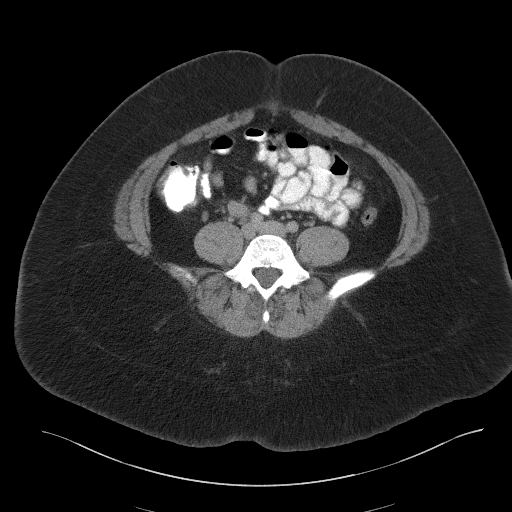
[im 49/94  soft-tissue]
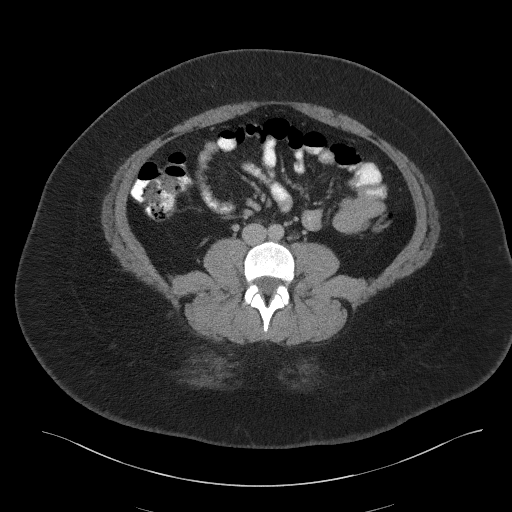
[im 53/94  soft-tissue]
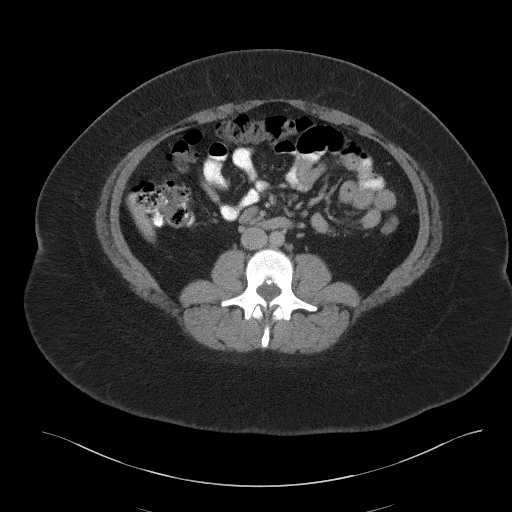
[im 61/94  soft-tissue]
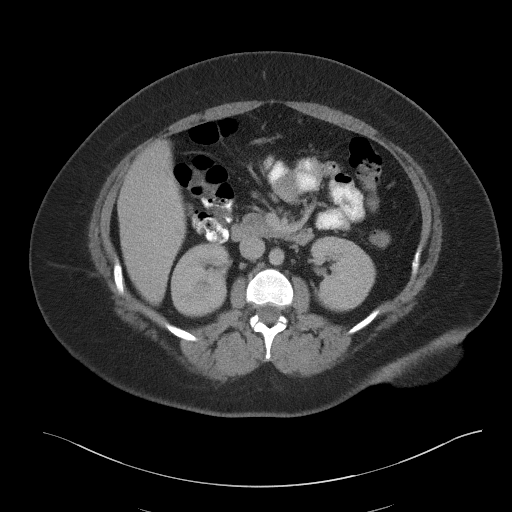
[im 61/94  bone]
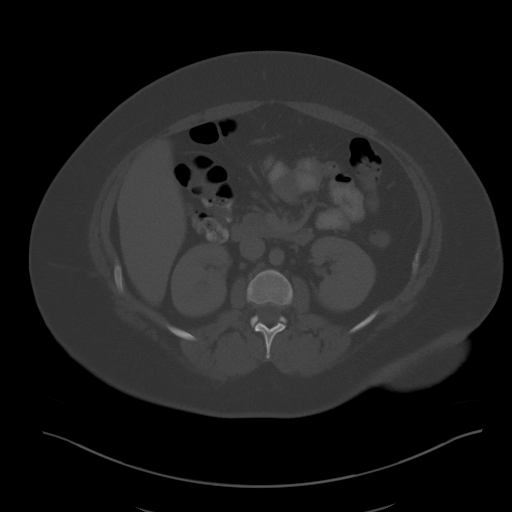
[im 69/94  soft-tissue]
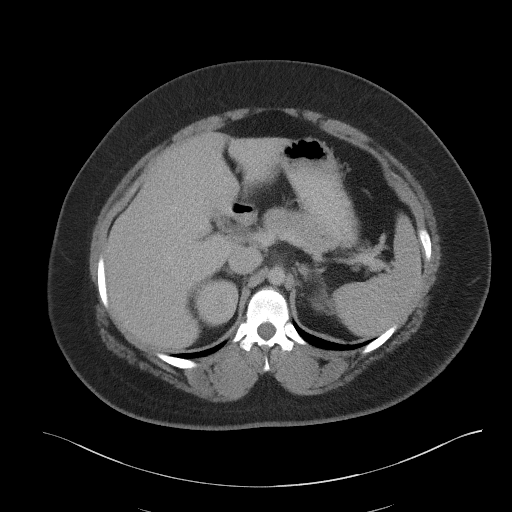
[im 73/94  soft-tissue]
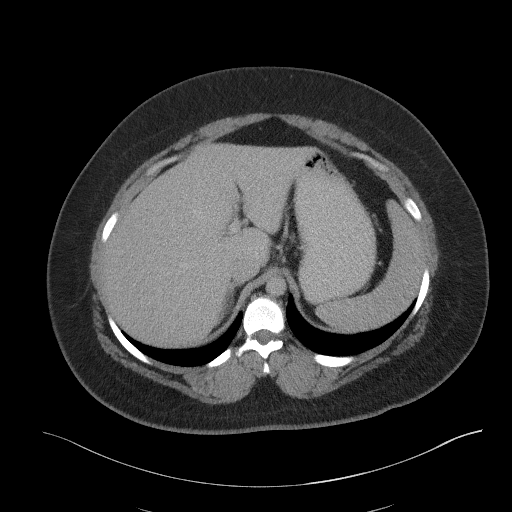
[im 81/94  soft-tissue]
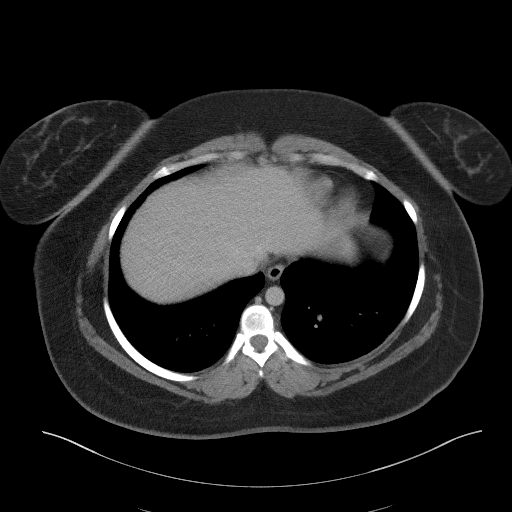
[im 89/94  soft-tissue]
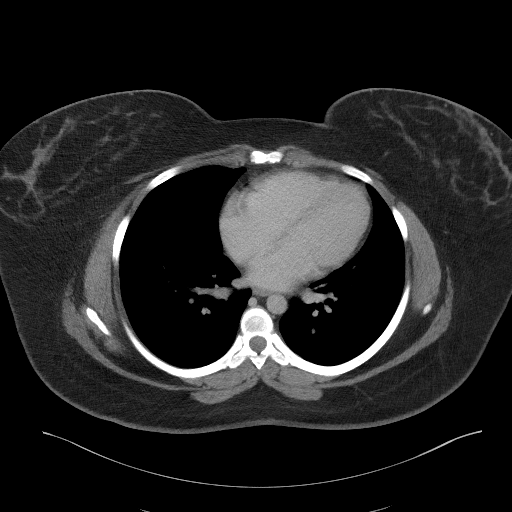

[Series 5: coronal st · coronal · 0.88mm/px · 3 of 107 slices shown]
[im 36/107  soft-tissue]
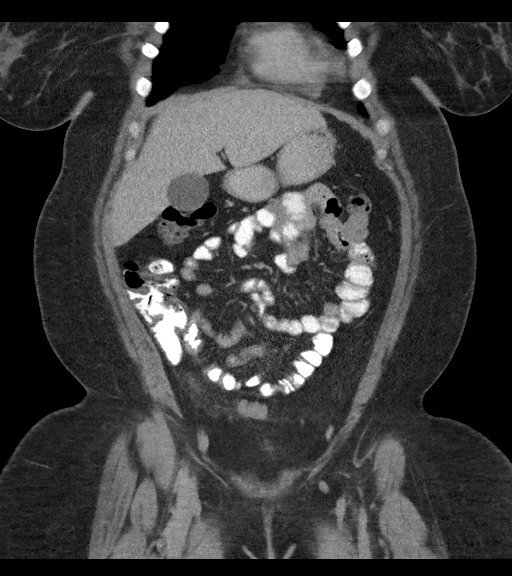
[im 48/107  soft-tissue]
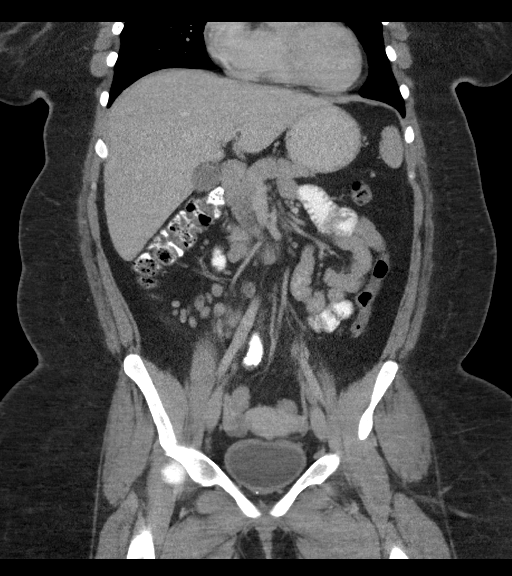
[im 59/107  soft-tissue]
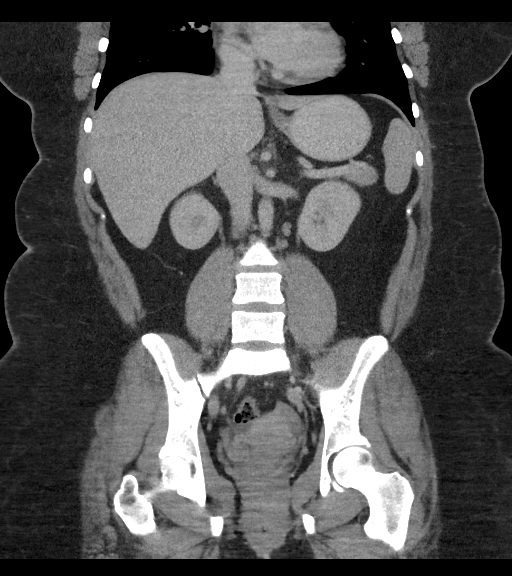

[16 of 46 positions shown; findings below may reference images not displayed]

FINDINGS: Lower chest: Lung bases demonstrate no acute consolidation or
effusion. Normal heart size.

Hepatobiliary: No focal liver abnormality is seen. No gallstones,
gallbladder wall thickening, or biliary dilatation.

Pancreas: Unremarkable. No pancreatic ductal dilatation or
surrounding inflammatory changes.

Spleen: Normal in size without focal abnormality.

Adrenals/Urinary Tract: Adrenal glands are unremarkable. Kidneys are
normal, without renal calculi, focal lesion, or hydronephrosis.
Bladder is unremarkable.

Stomach/Bowel: Stomach is nonenlarged. No dilated small bowel.
Abnormal appendix in the right lower quadrant, measuring up to 1 cm
in diameter. Moderate periappendiceal soft tissue stranding. No
extraluminal gas.

Vascular/Lymphatic: Nonaneurysmal aorta. Multiple right lower
quadrant subcentimeter mesenteric lymph nodes measuring up to 8 mm
in size.

Reproductive: Uterus unremarkable. Possible rim enhancing lesion in
the right ovary.

Other: Small free fluid in the colon.  No free air.

Musculoskeletal: No acute or significant osseous findings.
IMPRESSION: 1. Findings consistent with acute appendicitis.

Appendix: Location: Right lower quadrant

Diameter: 10 mm

Appendicolith: None

Mucosal hyper-enhancement: Mild mucosal enhancement present.

Extraluminal gas: Not seen

Periappendiceal collection: Not seen

2.   Trace free fluid in the pelvis

3. Possible rim enhancing right adnexal lesion, suggest correlation
with nonemergent pelvic ultrasound.

## 2019-06-30 ENCOUNTER — Other Ambulatory Visit: Payer: Self-pay

## 2019-06-30 ENCOUNTER — Encounter (HOSPITAL_BASED_OUTPATIENT_CLINIC_OR_DEPARTMENT_OTHER): Payer: Self-pay | Admitting: *Deleted

## 2019-06-30 DIAGNOSIS — R110 Nausea: Secondary | ICD-10-CM | POA: Diagnosis not present

## 2019-06-30 DIAGNOSIS — R102 Pelvic and perineal pain: Secondary | ICD-10-CM | POA: Diagnosis not present

## 2019-06-30 DIAGNOSIS — O26891 Other specified pregnancy related conditions, first trimester: Secondary | ICD-10-CM | POA: Diagnosis present

## 2019-06-30 DIAGNOSIS — Z3A09 9 weeks gestation of pregnancy: Secondary | ICD-10-CM | POA: Diagnosis not present

## 2019-06-30 LAB — COMPREHENSIVE METABOLIC PANEL
ALT: 16 U/L (ref 0–44)
AST: 15 U/L (ref 15–41)
Albumin: 4 g/dL (ref 3.5–5.0)
Alkaline Phosphatase: 49 U/L (ref 38–126)
Anion gap: 9 (ref 5–15)
BUN: 7 mg/dL (ref 6–20)
CO2: 23 mmol/L (ref 22–32)
Calcium: 8.9 mg/dL (ref 8.9–10.3)
Chloride: 101 mmol/L (ref 98–111)
Creatinine, Ser: 0.67 mg/dL (ref 0.44–1.00)
GFR calc Af Amer: >60 mL/min (ref >60)
GFR calc non Af Amer: >60 mL/min (ref >60)
Glucose, Bld: 94 mg/dL (ref 70–99)
Potassium: 3.7 mmol/L (ref 3.5–5.1)
Sodium: 133 mmol/L — ABNORMAL LOW (ref 135–145)
Total Bilirubin: 0.5 mg/dL (ref 0.3–1.2)
Total Protein: 7.6 g/dL (ref 6.5–8.1)

## 2019-06-30 LAB — URINALYSIS, ROUTINE W REFLEX MICROSCOPIC
Bilirubin Urine: NEGATIVE
Glucose, UA: NEGATIVE mg/dL
Hgb urine dipstick: NEGATIVE
Ketones, ur: NEGATIVE mg/dL
Leukocytes,Ua: NEGATIVE
Nitrite: NEGATIVE
Protein, ur: NEGATIVE mg/dL
Specific Gravity, Urine: 1.01 (ref 1.005–1.030)
pH: 6.5 (ref 5.0–8.0)

## 2019-06-30 LAB — CBC
HCT: 35.3 % — ABNORMAL LOW (ref 36.0–46.0)
Hemoglobin: 11.4 g/dL — ABNORMAL LOW (ref 12.0–15.0)
MCH: 28.8 pg (ref 26.0–34.0)
MCHC: 32.3 g/dL (ref 30.0–36.0)
MCV: 89.1 fL (ref 80.0–100.0)
Platelets: 323 10*3/uL (ref 150–400)
RBC: 3.96 MIL/uL (ref 3.87–5.11)
RDW: 15.3 % (ref 11.5–15.5)
WBC: 13.7 10*3/uL — ABNORMAL HIGH (ref 4.0–10.5)
nRBC: 0 % (ref 0.0–0.2)

## 2019-06-30 LAB — LIPASE, BLOOD: Lipase: 21 U/L (ref 11–51)

## 2019-06-30 MED ORDER — SODIUM CHLORIDE 0.9% FLUSH
3.0000 mL | Freq: Once | INTRAVENOUS | Status: DC
  Filled 2019-06-30: qty 3

## 2019-06-30 NOTE — ED Triage Notes (Signed)
Pt reports that she is [redacted] weeks pregnant, started having left sided pelvic pain today. Denies vaginal discharge. Pt has had [place] to confirm pregnancy placement. Denies dysuria.

## 2019-07-01 ENCOUNTER — Emergency Department (HOSPITAL_BASED_OUTPATIENT_CLINIC_OR_DEPARTMENT_OTHER)
Admission: EM | Admit: 2019-07-01 | Discharge: 2019-07-01 | Disposition: A | Discharge status: Home / Self Care | Payer: Medicaid Other | Attending: Emergency Medicine | Admitting: Emergency Medicine

## 2019-07-01 ENCOUNTER — Ambulatory Visit (HOSPITAL_BASED_OUTPATIENT_CLINIC_OR_DEPARTMENT_OTHER)
Admission: RE | Admit: 2019-07-01 | Discharge: 2019-07-01 | Disposition: A | Discharge status: Home / Self Care | Payer: Medicaid Other | Source: Ambulatory Visit | Attending: Emergency Medicine | Admitting: Emergency Medicine

## 2019-07-01 ENCOUNTER — Other Ambulatory Visit (HOSPITAL_BASED_OUTPATIENT_CLINIC_OR_DEPARTMENT_OTHER): Payer: Self-pay | Admitting: Emergency Medicine

## 2019-07-01 DIAGNOSIS — Z3A09 9 weeks gestation of pregnancy: Secondary | ICD-10-CM | POA: Insufficient documentation

## 2019-07-01 DIAGNOSIS — R1032 Left lower quadrant pain: Secondary | ICD-10-CM | POA: Insufficient documentation

## 2019-07-01 DIAGNOSIS — O26891 Other specified pregnancy related conditions, first trimester: Secondary | ICD-10-CM | POA: Insufficient documentation

## 2019-07-01 LAB — HCG, QUANTITATIVE, PREGNANCY: hCG, Beta Chain, Quant, S: 167737 m[IU]/mL — ABNORMAL HIGH (ref <5)

## 2019-07-01 LAB — ABO/RH: ABO/RH(D): O POS

## 2019-07-01 NOTE — ED Notes (Signed)
Pt states sharp shooting pain in LLQ that does not radiate.

## 2019-07-01 NOTE — ED Provider Notes (Signed)
[facility] DEPT [facility] Provider Note: Miranda Spurling, MD, FACEP  CSN: [id number] MRN: [id number] ARRIVAL: 06/30/19 at 2044 ROOM: [place]  Abdominal Pain   HISTORY OF PRESENT ILLNESS  07/01/19 1:55 AM Miranda Mack is a 24 y.o. female who states she is [redacted] weeks pregnant, P1 G0.  She is having left-sided pelvic pain since yesterday afternoon about 3 PM.  She denies associated vaginal bleeding or discharge.  She rates the pain as an 8 out of 10, sharp in nature and worse with palpation but not movement. She has had nausea which she attributes to her pregnancy but no vomiting.  She has had diarrhea because she is on MiraLAX.   Past Medical History:  Diagnosis Date  . GERD (gastroesophageal reflux disease)   . Miscarriage within last 12 months   . Ovarian cyst     Past Surgical History:  Procedure Laterality Date  . APPENDECTOMY    . LAPAROSCOPIC APPENDECTOMY N/A 04/02/2017   Procedure: APPENDECTOMY LAPAROSCOPIC;  Surgeon: Armandina Gemma, MD;  Location: [facility];  Service: General;  Laterality: N/A;    Family History  Problem Relation Age of Onset  . Diabetes Father   . Hypertension Father   . Diabetes Mother     Social History   Tobacco Use  . Smoking status: Never Smoker  . Smokeless tobacco: Never Used  Vaping Use  . Vaping Use: Never used  Substance Use Topics  . Alcohol use: Yes    Comment: occ  . Drug use: No    Prior to Admission medications   Medication Sig Start Date End Date Taking? Authorizing Provider  acetaminophen (TYLENOL) 325 MG tablet Take 2 tablets (650 mg total) by mouth every 6 (six) hours as needed for mild pain (or Fever >/= 101). 04/03/17   Simaan, Darci Current, PA-C  cephALEXin (KEFLEX) 500 MG capsule Take 1 capsule (500 mg total) by mouth 2 (two) times daily. 09/13/18   Davonna Belling, MD  citalopram (CELEXA) 40 MG tablet Take 1 tablet by mouth once daily 02/02/19   [provider]  diphenhydrAMINE (BENADRYL) 25 mg  capsule Take 25 mg by mouth every 6 (six) hours as needed for allergies.    [provider]  meloxicam (MOBIC) 15 MG tablet meloxicam 15 mg tablet  TK 1 T PO ONCE A DAY WITH A MEAL PRN    [provider]  methocarbamol (ROBAXIN) 500 MG tablet methocarbamol 500 mg tablet  TK 1 T PO TID FOR 10 DAYS PRN    [provider]  Multiple Vitamin (MULTIVITAMIN WITH MINERALS) TABS tablet Take 1 tablet by mouth daily.    [provider]  misoprostol (CYTOTEC) 200 MCG tablet Place four tablets in between your gums and cheeks (two tablets on each side) as instructed Patient not taking: Reported on 09/25/2018 09/17/18 07/01/19  [name], DO    Allergies Patient has no known allergies.   REVIEW OF SYSTEMS  Negative except as noted here or in the History of Present Illness.   PHYSICAL EXAMINATION  Initial Vital Signs Blood pressure 133/71, pulse 92, temperature 98.4 F (36.9 C), temperature source Oral, resp. rate 16, height 5\' 3"  (1.6 m), weight 122.5 kg, last menstrual period 02/11/2019, SpO2 100 %.  Examination General: Well-developed, well-nourished female in no acute distress; appearance consistent with age of record HENT: normocephalic; atraumatic Eyes: Normal appearance Neck: supple Heart: regular rate and rhythm Lungs: clear to auscultation bilaterally Abdomen: soft; nondistended; left lower quadrant  tenderness; bowel sounds present; viable IUP seen on bedside ultrasound, fetal heart tones 140s Extremities: No deformity; full range of motion; pulses normal Neurologic: Awake, alert and oriented; motor function intact in all extremities and symmetric; no facial droop Skin: Warm and dry Psychiatric: Normal mood and affect   RESULTS  Summary of this visit's results, reviewed and interpreted by myself:   EKG Interpretation  Date/Time:    Ventricular Rate:    PR Interval:    QRS Duration:   QT Interval:    QTC Calculation:   R Axis:     Text  Interpretation:        Laboratory Studies: Results for orders placed or performed during the hospital encounter of 07/01/19 (from the past 24 hour(s))  Lipase, blood     Status: None   Collection Time: 06/30/19 10:05 PM  Result Value Ref Range   Lipase 21 11 - 51 U/L  Comprehensive metabolic panel     Status: Abnormal   Collection Time: 06/30/19 10:05 PM  Result Value Ref Range   Sodium 133 (L) 135 - 145 mmol/L   Potassium 3.7 3.5 - 5.1 mmol/L   Chloride 101 98 - 111 mmol/L   CO2 23 22 - 32 mmol/L   Glucose, Bld 94 70 - 99 mg/dL   BUN 7 6 - 20 mg/dL   Creatinine, Ser 6.78 0.44 - 1.00 mg/dL   Calcium 8.9 8.9 - 93.8 mg/dL   Total Protein 7.6 6.5 - 8.1 g/dL   Albumin 4.0 3.5 - 5.0 g/dL   AST 15 15 - 41 U/L   ALT 16 0 - 44 U/L   Alkaline Phosphatase 49 38 - 126 U/L   Total Bilirubin 0.5 0.3 - 1.2 mg/dL   GFR calc non Af Amer >60 >60 mL/min   GFR calc Af Amer >60 >60 mL/min   Anion gap 9 5 - 15  CBC     Status: Abnormal   Collection Time: 06/30/19 10:05 PM  Result Value Ref Range   WBC 13.7 (H) 4.0 - 10.5 K/uL   RBC 3.96 3.87 - 5.11 MIL/uL   Hemoglobin 11.4 (L) 12.0 - 15.0 g/dL   HCT 10.1 (L) 36 - 46 %   MCV 89.1 80.0 - 100.0 fL   MCH 28.8 26.0 - 34.0 pg   MCHC 32.3 30.0 - 36.0 g/dL   RDW 75.1 02.5 - 85.2 %   Platelets 323 150 - 400 K/uL   nRBC 0.0 0.0 - 0.2 %  Urinalysis, Routine w reflex microscopic     Status: None   Collection Time: 06/30/19 10:12 PM  Result Value Ref Range   Color, Urine YELLOW YELLOW   APPearance CLEAR CLEAR   Specific Gravity, Urine 1.010 1.005 - 1.030   pH 6.5 5.0 - 8.0   Glucose, UA NEGATIVE NEGATIVE mg/dL   Hgb urine dipstick NEGATIVE NEGATIVE   Bilirubin Urine NEGATIVE NEGATIVE   Ketones, ur NEGATIVE NEGATIVE mg/dL   Protein, ur NEGATIVE NEGATIVE mg/dL   Nitrite NEGATIVE NEGATIVE   Leukocytes,Ua NEGATIVE NEGATIVE   Imaging Studies: No results found.  ED COURSE and MDM  Nursing notes, initial and subsequent vitals signs, including  pulse oximetry, reviewed and interpreted by myself.  Vitals:   06/30/19 2124 07/01/19 0010  BP: 117/64 133/71  Pulse: 82 92  Resp: 14 16  Temp: 98.4 F (36.9 C)   TempSrc: Oral   SpO2: 100% 100%  Weight: 122.5 kg   Height: 5\' 3"  (1.6 m)  Medications - No data to display  We will have patient return later this morning for transvaginal ultrasound to assess pregnancy and adnexa.  PROCEDURES  Procedures   ED DIAGNOSES     ICD-10-CM   1. Abdominal pain during pregnancy in first trimester  O26.891    R10.9        Jayland Null, Jonny Ruiz, MD 07/01/19 2637

## 2019-10-24 ENCOUNTER — Other Ambulatory Visit: Payer: Self-pay

## 2019-10-24 ENCOUNTER — Inpatient Hospital Stay (HOSPITAL_COMMUNITY)
Admission: AD | Admit: 2019-10-24 | Discharge: 2019-10-24 | Disposition: A | Discharge status: Home / Self Care | Payer: Medicaid Other | Attending: Obstetrics and Gynecology | Admitting: Obstetrics and Gynecology

## 2019-10-24 ENCOUNTER — Encounter (HOSPITAL_COMMUNITY): Payer: Self-pay | Admitting: Obstetrics and Gynecology

## 2019-10-24 DIAGNOSIS — O98812 Other maternal infectious and parasitic diseases complicating pregnancy, second trimester: Secondary | ICD-10-CM

## 2019-10-24 DIAGNOSIS — O368120 Decreased fetal movements, second trimester, not applicable or unspecified: Secondary | ICD-10-CM | POA: Diagnosis not present

## 2019-10-24 DIAGNOSIS — O99891 Other specified diseases and conditions complicating pregnancy: Secondary | ICD-10-CM | POA: Diagnosis not present

## 2019-10-24 DIAGNOSIS — R5383 Other fatigue: Secondary | ICD-10-CM | POA: Insufficient documentation

## 2019-10-24 DIAGNOSIS — G44209 Tension-type headache, unspecified, not intractable: Secondary | ICD-10-CM | POA: Insufficient documentation

## 2019-10-24 DIAGNOSIS — A084 Viral intestinal infection, unspecified: Secondary | ICD-10-CM | POA: Diagnosis not present

## 2019-10-24 DIAGNOSIS — A09 Infectious gastroenteritis and colitis, unspecified: Secondary | ICD-10-CM

## 2019-10-24 DIAGNOSIS — O99352 Diseases of the nervous system complicating pregnancy, second trimester: Secondary | ICD-10-CM | POA: Insufficient documentation

## 2019-10-24 DIAGNOSIS — Z3A25 25 weeks gestation of pregnancy: Secondary | ICD-10-CM | POA: Insufficient documentation

## 2019-10-24 DIAGNOSIS — R519 Headache, unspecified: Secondary | ICD-10-CM

## 2019-10-24 DIAGNOSIS — O99612 Diseases of the digestive system complicating pregnancy, second trimester: Secondary | ICD-10-CM | POA: Diagnosis not present

## 2019-10-24 LAB — CBC
HCT: 31.5 % — ABNORMAL LOW (ref 36.0–46.0)
Hemoglobin: 10.3 g/dL — ABNORMAL LOW (ref 12.0–15.0)
MCH: 29.6 pg (ref 26.0–34.0)
MCHC: 32.7 g/dL (ref 30.0–36.0)
MCV: 90.5 fL (ref 80.0–100.0)
Platelets: 326 10*3/uL (ref 150–400)
RBC: 3.48 MIL/uL — ABNORMAL LOW (ref 3.87–5.11)
RDW: 14.6 % (ref 11.5–15.5)
WBC: 14.4 10*3/uL — ABNORMAL HIGH (ref 4.0–10.5)
nRBC: 0 % (ref 0.0–0.2)

## 2019-10-24 LAB — BASIC METABOLIC PANEL
Anion gap: 9 (ref 5–15)
BUN: <5 mg/dL — ABNORMAL LOW (ref 6–20)
CO2: 21 mmol/L — ABNORMAL LOW (ref 22–32)
Calcium: 9.1 mg/dL (ref 8.9–10.3)
Chloride: 106 mmol/L (ref 98–111)
Creatinine, Ser: 0.6 mg/dL (ref 0.44–1.00)
GFR, Estimated: >60 mL/min (ref >60)
Glucose, Bld: 103 mg/dL — ABNORMAL HIGH (ref 70–99)
Potassium: 3.9 mmol/L (ref 3.5–5.1)
Sodium: 136 mmol/L (ref 135–145)

## 2019-10-24 LAB — URINALYSIS, ROUTINE W REFLEX MICROSCOPIC
Bilirubin Urine: NEGATIVE
Glucose, UA: NEGATIVE mg/dL
Hgb urine dipstick: NEGATIVE
Ketones, ur: NEGATIVE mg/dL
Leukocytes,Ua: NEGATIVE
Nitrite: NEGATIVE
Protein, ur: NEGATIVE mg/dL
Specific Gravity, Urine: 1.02 (ref 1.005–1.030)
pH: 7 (ref 5.0–8.0)

## 2019-10-24 MED ORDER — ONDANSETRON HCL 4 MG PO TABS
4.0000 mg | ORAL_TABLET | Freq: Three times a day (TID) | ORAL | 0 refills | Status: DC | PRN
Start: 2019-10-24 — End: 2020-02-06

## 2019-10-24 MED ORDER — BUTALBITAL-APAP-CAFFEINE 50-325-40 MG PO TABS
1.0000 | ORAL_TABLET | Freq: Once | ORAL | Status: AC
  Administered 2019-10-24: 1 via ORAL
  Filled 2019-10-24: qty 1

## 2019-10-24 NOTE — Discharge Instructions (Signed)
Viral Gastroenteritis, Adult  Viral gastroenteritis is also known as the stomach flu. This condition may affect your stomach, your small intestine, and your large intestine. It can cause sudden watery poop (diarrhea), fever, and throwing up (vomiting). This condition is caused by certain germs (viruses). These germs can be passed from person to person very easily (are contagious). Having watery poop and throwing up can make you feel weak and cause you to not have enough water in your body (get dehydrated). This can make you tired and thirsty, make you have a dry mouth, and make it so you pee (urinate) less often. It is important to replace the fluids that you lose from having watery poop and throwing up. What are the causes?  You can get sick by catching viruses from other people.  You can also get sick by: ? Eating food, drinking water, or touching a surface that has the viruses on it (is contaminated). ? Sharing utensils or other personal items with a person who is sick. What increases the risk?  Having a weak body defense system (immune system).  Living with one or more children who are younger than 2 years old.  Living in a nursing home.  Going on cruise ships. What are the signs or symptoms? Symptoms of this condition start suddenly. Symptoms may last for a few days or for as long as a week.  Common symptoms include: ? Watery poop. ? Throwing up.  Other symptoms include: ? Fever. ? Headache. ? Feeling tired (fatigue). ? Pain in the belly (abdomen). ? Chills. ? Feeling weak. ? Feeling sick to your stomach (nauseous). ? Muscle aches. ? Not feeling hungry. How is this treated?  This condition typically goes away on its own.  The focus of treatment is to replace the fluids that you lose. This condition may be treated with: ? An ORS (oral rehydration solution). This is a drink that is sold at pharmacies and stores. ? Medicines to help with your symptoms. ? Probiotic  supplements to reduce symptoms of diarrhea. ? Fluids given through an IV tube, if needed.  Older adults and people with other diseases or a weak body defense system are at higher risk for not having enough water in the body. Follow these instructions at home: Eating and drinking   Take an ORS as told by your doctor.  Drink clear fluids in small amounts as you are able. Clear fluids include: ? Water. ? Ice chips. ? Fruit juice with water added to it (diluted). ? Low-calorie sports drinks.  Drink enough fluid to keep your pee (urine) pale yellow.  Eat small amounts of healthy foods every 3-4 hours as you are able. This may include whole grains, fruits, vegetables, lean meats, and yogurt.  Avoid fluids that have a lot of sugar or caffeine in them, such as energy drinks, sports drinks, and soda.  Avoid spicy or fatty foods.  Avoid alcohol. General instructions   Wash your hands often. This is very important after you have watery poop or you throw up. If you cannot use soap and water, use hand sanitizer.  Make sure that all people in your home wash their hands well and often.  Take over-the-counter and prescription medicines only as told by your doctor.  Rest at home while you get better.  Watch your condition for any changes.  Take a warm bath to help with any burning or pain from having watery poop.  Keep all follow-up visits as told by your doctor.   This is important. Contact a doctor if:  You cannot keep fluids down.  Your symptoms get worse.  You have new symptoms.  You feel light-headed.  You feel dizzy.  You have muscle cramps. Get help right away if:  You have chest pain.  You feel very weak.  You pass out (faint).  You see blood in your throw-up.  Your throw-up looks like coffee grounds.  You have bloody or black poop (stools) or poop that looks like tar.  You have a very bad headache, or a stiff neck, or both.  You have a rash.  You have  very bad pain, cramping, or bloating in your belly.  You have trouble breathing.  You are breathing very quickly.  You have a fast heartbeat.  Your skin feels cold and clammy.  You feel mixed up (confused).  You have pain when you pee.  You have signs of not having enough water in the body, such as: ? Dark pee, hardly any pee, or no pee. ? Cracked lips. ? Dry mouth. ? Sunken eyes. ? Feeling very sleepy. ? Feeling weak. Summary  Viral gastroenteritis is also known as the stomach flu.  This condition can cause sudden watery poop (diarrhea), fever, and throwing up (vomiting).  These germs can be passed from person to person very easily.  Take an ORS as told by your doctor. This is a drink that is sold at pharmacies and stores.  Drink fluids in small amounts many times each day as you are able. This information is not intended to replace advice given to you by your health care provider. Make sure you discuss any questions you have with your health care provider. Document Revised: 11/05/2017 Document Reviewed: 11/05/2017 Elsevier Patient Education  2020 [employer]   Fetal Movement Counts Patient Name: ________________________________________________ Patient Due Date: ____________________ What is a fetal movement count?  A fetal movement count is the number of times that you feel your baby move during a certain amount of time. This may also be called a fetal kick count. A fetal movement count is recommended for every pregnant woman. You may be asked to start counting fetal movements as early as week 28 of your pregnancy. Pay attention to when your baby is most active. You may notice your baby's sleep and wake cycles. You may also notice things that make your baby move more. You should do a fetal movement count:  When your baby is normally most active.  At the same time each day. A good time to count movements is while you are resting, after having something to eat and  drink. How do I count fetal movements? 1. Find a quiet, comfortable area. Sit, or lie down on your side. 2. Write down the date, the start time and stop time, and the number of movements that you felt between those two times. Take this information with you to your health care visits. 3. Write down your start time when you feel the first movement. 4. Count kicks, flutters, swishes, rolls, and jabs. You should feel at least 10 movements. 5. You may stop counting after you have felt 10 movements, or if you have been counting for 2 hours. Write down the stop time. 6. If you do not feel 10 movements in 2 hours, contact your health care provider for further instructions. Your health care provider may want to do additional tests to assess your baby's well-being. Contact a health care provider if:  You feel fewer than 10  movements in 2 hours.  Your baby is not moving like he or she usually does. Date: ____________ Start time: ____________ Stop time: ____________ Movements: ____________ Date: ____________ Start time: ____________ Stop time: ____________ Movements: ____________ Date: ____________ Start time: ____________ Stop time: ____________ Movements: ____________ Date: ____________ Start time: ____________ Stop time: ____________ Movements: ____________ Date: ____________ Start time: ____________ Stop time: ____________ Movements: ____________ Date: ____________ Start time: ____________ Stop time: ____________ Movements: ____________ Date: ____________ Start time: ____________ Stop time: ____________ Movements: ____________ Date: ____________ Start time: ____________ Stop time: ____________ Movements: ____________ Date: ____________ Start time: ____________ Stop time: ____________ Movements: ____________ This information is not intended to replace advice given to you by your health care provider. Make sure you discuss any questions you have with your health care provider. Document Revised:  08/20/2018 Document Reviewed: 08/20/2018 Elsevier Patient Education  2020 [employer].

## 2019-10-24 NOTE — MAU Note (Signed)
Miranda Mack is a 24 y.o. at [redacted]w[redacted]d here in MAU reporting: states she has previously felt a lot of FM and then since Friday she has felt movement but it has been less. Pt reports she has a headache and diarrhea since Friday. Has had 1 episode in the past 24 hours. No vaginal bleeding. No LOF.   Onset of complaint: ongoing  Pain score: 5/10  Vitals:   10/24/19 1654  BP: 118/74  Pulse: (!) 104  Resp: 16  Temp: 98.3 F (36.8 C)  SpO2: 95%     FHT:141  Lab orders placed from triage: UA

## 2019-10-24 NOTE — MAU Provider Note (Addendum)
History     CSN: [id number]  Arrival date and time: 10/24/19 1640   First Provider Initiated Contact with Patient 10/24/19 1719      Chief Complaint  Patient presents with   Decreased Fetal Movement   Headache   24 y.o. G2P0010 @25 .3 wks presenting with diarrhea, HA, and decreased FM. Diarrhea started 2 days ago. Had 3 episodes that day, 2 yesterday, and 1 today. Reports decreased appetite and fatigue. She is eating and drinking but less. Denies fevers or sick contacts. HA started at the same time. Located right frontal, temporal, and occipital. Reports +FM but less over last 2 days. Denies abd pain, VB, LOF.    OB History    Gravida  2   Para      Term      Preterm      AB  1   Living        SAB  1   TAB      Ectopic      Multiple      Live Births              Past Medical History:  Diagnosis Date   GERD (gastroesophageal reflux disease)    Miscarriage within last 12 months    Ovarian cyst     Past Surgical History:  Procedure Laterality Date   APPENDECTOMY     LAPAROSCOPIC APPENDECTOMY N/A 04/02/2017   Procedure: APPENDECTOMY LAPAROSCOPIC;  Surgeon: 04/04/2017, MD;  Location: [facility];  Service: General;  Laterality: N/A;    Family History  Problem Relation Age of Onset   Diabetes Father    Hypertension Father    Diabetes Mother     Social History   Tobacco Use   Smoking status: Never Smoker   Smokeless tobacco: Never Used  Vaping Use   Vaping Use: Never used  Substance Use Topics   Alcohol use: Not Currently    Comment: occ   Drug use: No    Allergies: No Known Allergies  No medications prior to admission.    Review of Systems  Constitutional: Positive for appetite change. Negative for chills and fever.  Eyes: Negative for visual disturbance.  Gastrointestinal: Positive for diarrhea and nausea. Negative for abdominal pain and vomiting.  Genitourinary: Negative for vaginal bleeding and vaginal discharge.   Neurological: Positive for headaches.   Physical Exam   Blood pressure 126/68, pulse 81, temperature 98.3 F (36.8 C), temperature source Oral, resp. rate 16, height 5\' 3"  (1.6 m), weight 128.4 kg, last menstrual period 02/11/2019, SpO2 99 %.  Physical Exam Vitals and nursing note reviewed.  Constitutional:      General: She is not in acute distress.    Appearance: Normal appearance.  HENT:     Head: Normocephalic and atraumatic.  Eyes:     Extraocular Movements: Extraocular movements intact.     Pupils: Pupils are equal, round, and reactive to light.  Cardiovascular:     Rate and Rhythm: Normal rate.  Pulmonary:     Effort: Pulmonary effort is normal.  Abdominal:     Palpations: Abdomen is soft.     Tenderness: There is no abdominal tenderness.  Musculoskeletal:        General: Normal range of motion.     Cervical back: Normal range of motion.  Skin:    General: Skin is warm and dry.  Neurological:     General: No focal deficit present.     Mental Status: She is alert and  oriented to person, place, and time.     Cranial Nerves: Cranial nerves are intact.     Deep Tendon Reflexes: Reflexes are normal and symmetric.  Psychiatric:        Mood and Affect: Mood normal.        Behavior: Behavior normal.    EFM: 140 bpm, mod variability, + accels, no decels Toco: none  Results for orders placed or performed during the hospital encounter of 10/24/19 (from the past 24 hour(s))  Basic metabolic panel     Status: Abnormal   Collection Time: 10/24/19  5:40 PM  Result Value Ref Range   Sodium 136 135 - 145 mmol/L   Potassium 3.9 3.5 - 5.1 mmol/L   Chloride 106 98 - 111 mmol/L   CO2 21 (L) 22 - 32 mmol/L   Glucose, Bld 103 (H) 70 - 99 mg/dL   BUN <5 (L) 6 - 20 mg/dL   Creatinine, Ser 8.56 0.44 - 1.00 mg/dL   Calcium 9.1 8.9 - 31.4 mg/dL   GFR, Estimated >97 >02 mL/min   Anion gap 9 5 - 15  CBC     Status: Abnormal   Collection Time: 10/24/19  5:40 PM  Result Value Ref  Range   WBC 14.4 (H) 4.0 - 10.5 K/uL   RBC 3.48 (L) 3.87 - 5.11 MIL/uL   Hemoglobin 10.3 (L) 12.0 - 15.0 g/dL   HCT 63.7 (L) 36 - 46 %   MCV 90.5 80.0 - 100.0 fL   MCH 29.6 26.0 - 34.0 pg   MCHC 32.7 30.0 - 36.0 g/dL   RDW 85.8 85.0 - 27.7 %   Platelets 326 150 - 400 K/uL   nRBC 0.0 0.0 - 0.2 %  Urinalysis, Routine w reflex microscopic Urine, Clean Catch     Status: Abnormal   Collection Time: 10/24/19  6:04 PM  Result Value Ref Range   Color, Urine YELLOW YELLOW   APPearance HAZY (A) CLEAR   Specific Gravity, Urine 1.020 1.005 - 1.030   pH 7.0 5.0 - 8.0   Glucose, UA NEGATIVE NEGATIVE mg/dL   Hgb urine dipstick NEGATIVE NEGATIVE   Bilirubin Urine NEGATIVE NEGATIVE   Ketones, ur NEGATIVE NEGATIVE mg/dL   Protein, ur NEGATIVE NEGATIVE mg/dL   Nitrite NEGATIVE NEGATIVE   Leukocytes,Ua NEGATIVE NEGATIVE   MAU Course  Procedures Fioricet  MDM Labs ordered and reviewed. HA improved. Suspect viral gastroenteritis. Discussed supportive treatment. Pt feeling good FM, abundantly marked on NST. Stable for discharge home.  Assessment and Plan   1. [redacted] weeks gestation of pregnancy   2. Decreased fetal movements in second trimester, single or unspecified fetus   3. Acute non intractable tension-type headache   4. Viral gastroenteritis    Discharge home [facility] at [facility] as scheduled Rx Zofran Maintain hydration BRAT diet Imodium OTC prn  Allergies as of 10/24/2019   No Known Allergies     Medication List    TAKE these medications   citalopram 40 MG tablet Commonly known as: CELEXA Take 1 tablet by mouth once daily   diphenhydrAMINE 25 mg capsule Commonly known as: BENADRYL Take 25 mg by mouth every 6 (six) hours as needed for allergies.   multivitamin with minerals Tabs tablet Take 1 tablet by mouth daily.   ondansetron 4 MG tablet Commonly known as: ZOFRAN Take 1 tablet (4 mg total) by mouth every 8 (eight) hours as needed for nausea or vomiting.      'Donette Larry,  CNM 10/24/2019, 7:45 PM

## 2019-11-03 ENCOUNTER — Inpatient Hospital Stay (HOSPITAL_COMMUNITY)
Admission: AD | Admit: 2019-11-03 | Discharge: 2019-11-04 | Disposition: A | Discharge status: Home / Self Care | Payer: Medicaid Other | Attending: Obstetrics and Gynecology | Admitting: Obstetrics and Gynecology

## 2019-11-03 ENCOUNTER — Other Ambulatory Visit: Payer: Self-pay

## 2019-11-03 ENCOUNTER — Encounter (HOSPITAL_COMMUNITY): Payer: Self-pay | Admitting: Obstetrics and Gynecology

## 2019-11-03 DIAGNOSIS — O99612 Diseases of the digestive system complicating pregnancy, second trimester: Secondary | ICD-10-CM | POA: Insufficient documentation

## 2019-11-03 DIAGNOSIS — O26892 Other specified pregnancy related conditions, second trimester: Secondary | ICD-10-CM | POA: Diagnosis not present

## 2019-11-03 DIAGNOSIS — Z8759 Personal history of other complications of pregnancy, childbirth and the puerperium: Secondary | ICD-10-CM | POA: Insufficient documentation

## 2019-11-03 DIAGNOSIS — O09292 Supervision of pregnancy with other poor reproductive or obstetric history, second trimester: Secondary | ICD-10-CM | POA: Diagnosis not present

## 2019-11-03 DIAGNOSIS — K219 Gastro-esophageal reflux disease without esophagitis: Secondary | ICD-10-CM | POA: Insufficient documentation

## 2019-11-03 DIAGNOSIS — O99891 Other specified diseases and conditions complicating pregnancy: Secondary | ICD-10-CM

## 2019-11-03 DIAGNOSIS — Z3A27 27 weeks gestation of pregnancy: Secondary | ICD-10-CM | POA: Diagnosis not present

## 2019-11-03 DIAGNOSIS — R109 Unspecified abdominal pain: Secondary | ICD-10-CM | POA: Insufficient documentation

## 2019-11-03 DIAGNOSIS — M7918 Myalgia, other site: Secondary | ICD-10-CM | POA: Diagnosis not present

## 2019-11-03 LAB — CBC WITH DIFFERENTIAL/PLATELET
Abs Immature Granulocytes: 0.17 10*3/uL — ABNORMAL HIGH (ref 0.00–0.07)
Basophils Absolute: 0 10*3/uL (ref 0.0–0.1)
Basophils Relative: 0 %
Eosinophils Absolute: 0.2 10*3/uL (ref 0.0–0.5)
Eosinophils Relative: 1 %
HCT: 32.5 % — ABNORMAL LOW (ref 36.0–46.0)
Hemoglobin: 10.5 g/dL — ABNORMAL LOW (ref 12.0–15.0)
Immature Granulocytes: 1 %
Lymphocytes Relative: 17 %
Lymphs Abs: 2.6 10*3/uL (ref 0.7–4.0)
MCH: 29.6 pg (ref 26.0–34.0)
MCHC: 32.3 g/dL (ref 30.0–36.0)
MCV: 91.5 fL (ref 80.0–100.0)
Monocytes Absolute: 1 10*3/uL (ref 0.1–1.0)
Monocytes Relative: 7 %
Neutro Abs: 11.1 10*3/uL — ABNORMAL HIGH (ref 1.7–7.7)
Neutrophils Relative %: 74 %
Platelets: 327 10*3/uL (ref 150–400)
RBC: 3.55 MIL/uL — ABNORMAL LOW (ref 3.87–5.11)
RDW: 14.6 % (ref 11.5–15.5)
WBC: 15.1 10*3/uL — ABNORMAL HIGH (ref 4.0–10.5)
nRBC: 0 % (ref 0.0–0.2)

## 2019-11-03 LAB — URINALYSIS, ROUTINE W REFLEX MICROSCOPIC
Bilirubin Urine: NEGATIVE
Glucose, UA: NEGATIVE mg/dL
Hgb urine dipstick: NEGATIVE
Ketones, ur: NEGATIVE mg/dL
Leukocytes,Ua: NEGATIVE
Nitrite: NEGATIVE
Protein, ur: NEGATIVE mg/dL
Specific Gravity, Urine: 1.011 (ref 1.005–1.030)
pH: 7 (ref 5.0–8.0)

## 2019-11-03 MED ORDER — CYCLOBENZAPRINE HCL 5 MG PO TABS
5.0000 mg | ORAL_TABLET | Freq: Once | ORAL | Status: AC
  Administered 2019-11-03: 5 mg via ORAL
  Filled 2019-11-03: qty 1

## 2019-11-03 NOTE — MAU Note (Addendum)
Pt complaining of intermittent epigastric "burning"  pain that occurs only when she exhales. States is 8/10 when it happens. Does not currently have this pain.  She recently had RSV, endorses some some n/v. Denies contractions, LOF or VB. +FM

## 2019-11-03 NOTE — MAU Provider Note (Signed)
Chief Complaint:  Abdominal Pain   First Provider Initiated Contact with Patient 11/03/19 2157     HPI: Miranda Mack is a 24 y.o. 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 She was not lifting or doing anything strenuous at the time. She had some nausea this morning but it subsided without medications. She has been able to keep down food/fluids, had a normal bowel movement this morning and has been passing flatus easily. The pain is aggravated by movement and very deep breaths. Denies vaginal bleeding, leaking of fluid, decreased fetal movement, fever, falls, or recent illness.   Past Medical History:  Diagnosis Date  . GERD (gastroesophageal reflux disease)   . Miscarriage within last 12 months   . Ovarian cyst    OB History  Gravida Para Term Preterm AB Living  2       1    SAB TAB Ectopic Multiple Live Births  1            # Outcome Date GA Lbr Len/2nd Weight Sex Delivery Anes PTL Lv  2 Current           1 SAB            Past Surgical History:  Procedure Laterality Date  . APPENDECTOMY    . LAPAROSCOPIC APPENDECTOMY N/A 04/02/2017   Procedure: APPENDECTOMY LAPAROSCOPIC;  Surgeon: Darnell Level, MD;  Location: [facility];  Service: General;  Laterality: N/A;   Family History  Problem Relation Age of Onset  . Diabetes Father   . Hypertension Father   . Diabetes Mother    Social History   Tobacco Use  . Smoking status: Never Smoker  . Smokeless tobacco: Never Used  Vaping Use  . Vaping Use: Never used  Substance Use Topics  . Alcohol use: Not Currently    Comment: occ  . Drug use: No   No Known Allergies No medications prior to admission.    I have reviewed patient's Past Medical Hx, Surgical Hx, Family Hx, Social Hx, medications and allergies.   ROS:  Review of Systems  Constitutional: Negative for appetite change, fatigue and fever.  HENT: Negative for  congestion and sore throat.   Respiratory: Negative for cough, chest tightness and shortness of breath.   Gastrointestinal: Positive for abdominal pain and nausea. Negative for abdominal distention and vomiting.  Genitourinary: Negative for vaginal bleeding and vaginal discharge.  Musculoskeletal: Negative for back pain.  Neurological: Negative for dizziness, syncope and headaches.    Physical Exam   Patient Vitals for the past 24 hrs:  BP Temp Temp src Pulse Resp SpO2 Height Weight  11/03/19 2318 (!) 116/58 -- -- 93 -- -- -- --  11/03/19 2037 109/62 98.2 F (36.8 C) Oral (!) 105 18 98 % 5\' 3"  (1.6 m) 286 lb 6.4 oz (129.9 kg)    Constitutional: Well-developed, well-nourished female in no acute distress.  Cardiovascular: normal rate & rhythm, no murmur Respiratory: normal effort, lung sounds clear throughout GI: Abd soft, non-tender, gravid appropriate for gestational age. Pos BS x 4 MS: Extremities nontender, no edema, normal ROM Neurologic: Alert and oriented x 4.  Pelvic exam deferred    Fetal Tracing: reactive  Baseline: 145 Variability: moderate Accelerations: present Decelerations: none Toco: relaxed   Labs: Results for orders placed or performed during the hospital encounter of 11/03/19 (from the past 24 hour(s))  Urinalysis, Routine w reflex microscopic  Urine, Clean Catch     Status: None   Collection Time: 11/03/19  8:42 PM  Result Value Ref Range   Color, Urine YELLOW YELLOW   APPearance CLEAR CLEAR   Specific Gravity, Urine 1.011 1.005 - 1.030   pH 7.0 5.0 - 8.0   Glucose, UA NEGATIVE NEGATIVE mg/dL   Hgb urine dipstick NEGATIVE NEGATIVE   Bilirubin Urine NEGATIVE NEGATIVE   Ketones, ur NEGATIVE NEGATIVE mg/dL   Protein, ur NEGATIVE NEGATIVE mg/dL   Nitrite NEGATIVE NEGATIVE   Leukocytes,Ua NEGATIVE NEGATIVE  CBC with Differential/Platelet     Status: Abnormal   Collection Time: 11/03/19 10:15 PM  Result Value Ref Range   WBC 15.1 (H) 4.0 - 10.5 K/uL    RBC 3.55 (L) 3.87 - 5.11 MIL/uL   Hemoglobin 10.5 (L) 12.0 - 15.0 g/dL   HCT 17.4 (L) 36 - 46 %   MCV 91.5 80.0 - 100.0 fL   MCH 29.6 26.0 - 34.0 pg   MCHC 32.3 30.0 - 36.0 g/dL   RDW 08.1 44.8 - 18.5 %   Platelets 327 150 - 400 K/uL   nRBC 0.0 0.0 - 0.2 %   Neutrophils Relative % 74 %   Neutro Abs 11.1 (H) 1.7 - 7.7 K/uL   Lymphocytes Relative 17 %   Lymphs Abs 2.6 0.7 - 4.0 K/uL   Monocytes Relative 7 %   Monocytes Absolute 1.0 0.1 - 1.0 K/uL   Eosinophils Relative 1 %   Eosinophils Absolute 0.2 0.0 - 0.5 K/uL   Basophils Relative 0 %   Basophils Absolute 0.0 0.0 - 0.1 K/uL   Immature Granulocytes 1 %   Abs Immature Granulocytes 0.17 (H) 0.00 - 0.07 K/uL  Comprehensive metabolic panel     Status: Abnormal   Collection Time: 11/03/19 10:15 PM  Result Value Ref Range   Sodium 136 135 - 145 mmol/L   Potassium 3.8 3.5 - 5.1 mmol/L   Chloride 105 98 - 111 mmol/L   CO2 23 22 - 32 mmol/L   Glucose, Bld 87 70 - 99 mg/dL   BUN 5 (L) 6 - 20 mg/dL   Creatinine, Ser 6.31 0.44 - 1.00 mg/dL   Calcium 8.9 8.9 - 49.7 mg/dL   Total Protein 6.8 6.5 - 8.1 g/dL   Albumin 2.9 (L) 3.5 - 5.0 g/dL   AST 13 (L) 15 - 41 U/L   ALT 13 0 - 44 U/L   Alkaline Phosphatase 82 38 - 126 U/L   Total Bilirubin 0.3 0.3 - 1.2 mg/dL   GFR, Estimated >02 >63 mL/min   Anion gap 8 5 - 15  Lipase, blood     Status: None   Collection Time: 11/03/19 10:15 PM  Result Value Ref Range   Lipase 23 11 - 51 U/L    Imaging:  No results found.  MAU Course: Orders Placed This Encounter  Procedures  . Urinalysis, Routine w reflex microscopic Urine, Clean Catch  . CBC with Differential/Platelet  . Comprehensive metabolic panel  . Lipase, blood  . Discharge patient   Meds ordered this encounter  Medications  . cyclobenzaprine (FLEXERIL) tablet 5 mg  . cyclobenzaprine (FLEXERIL) 10 MG tablet    Sig: Take 1 tablet (10 mg total) by mouth every 8 (eight) hours as needed for muscle spasms.    Dispense:  30 tablet     Refill:  1    Order Specific Question:   Supervising Provider    Answer:   Tinnie Gens  S [[id number]]    MDM: Labs drawn to check liver enzymes and lipase - all normal Flexeril given with complete relief of pain  Assessment: 1. Abdominal muscle pain   2. [redacted] weeks gestation of pregnancy    Plan: Discharge home in stable condition with preterm labor precautions Educated on posture and ways to prevent musculoskeletal pain during 3rd trimester of pregnancy     Follow-up Information    Ob/Gyn, Nestor Ramp. Go to.   Why: as scheduled for ongoing prenatal care Contact information: [address] [place] [address] [phone]               Allergies as of 11/04/2019   No Known Allergies     Medication List    TAKE these medications   aspirin EC 81 MG tablet Take 81 mg by mouth daily. Swallow whole.   citalopram 40 MG tablet Commonly known as: CELEXA Take 1 tablet by mouth once daily   cyclobenzaprine 10 MG tablet Commonly known as: FLEXERIL Take 1 tablet (10 mg total) by mouth every 8 (eight) hours as needed for muscle spasms.   diphenhydrAMINE 25 mg capsule Commonly known as: BENADRYL Take 25 mg by mouth every 6 (six) hours as needed for allergies.   multivitamin with minerals Tabs tablet Take 1 tablet by mouth daily.   ondansetron 4 MG tablet Commonly known as: ZOFRAN Take 1 tablet (4 mg total) by mouth every 8 (eight) hours as needed for nausea or vomiting.      Edd Arbour, CNM, MSN, [facility] 11/04/19 12:50 AM

## 2019-11-04 LAB — COMPREHENSIVE METABOLIC PANEL
ALT: 13 U/L (ref 0–44)
AST: 13 U/L — ABNORMAL LOW (ref 15–41)
Albumin: 2.9 g/dL — ABNORMAL LOW (ref 3.5–5.0)
Alkaline Phosphatase: 82 U/L (ref 38–126)
Anion gap: 8 (ref 5–15)
BUN: 5 mg/dL — ABNORMAL LOW (ref 6–20)
CO2: 23 mmol/L (ref 22–32)
Calcium: 8.9 mg/dL (ref 8.9–10.3)
Chloride: 105 mmol/L (ref 98–111)
Creatinine, Ser: 0.72 mg/dL (ref 0.44–1.00)
GFR, Estimated: >60 mL/min (ref >60)
Glucose, Bld: 87 mg/dL (ref 70–99)
Potassium: 3.8 mmol/L (ref 3.5–5.1)
Sodium: 136 mmol/L (ref 135–145)
Total Bilirubin: 0.3 mg/dL (ref 0.3–1.2)
Total Protein: 6.8 g/dL (ref 6.5–8.1)

## 2019-11-04 LAB — LIPASE, BLOOD: Lipase: 23 U/L (ref 11–51)

## 2019-11-04 MED ORDER — CYCLOBENZAPRINE HCL 10 MG PO TABS
10.0000 mg | ORAL_TABLET | Freq: Three times a day (TID) | ORAL | 1 refills | Status: DC | PRN
Start: 2019-11-04 — End: 2020-02-06

## 2019-11-04 NOTE — Discharge Instructions (Signed)
Musculoskeletal Pain Musculoskeletal pain refers to aches and pains in your bones, joints, muscles, and the tissues that surround them. This pain can occur in any part of the body. It can last for a short time (acute) or a long time (chronic). A physical exam, lab tests, and imaging studies may be done to find the cause of your musculoskeletal pain. Follow these instructions at home:  Lifestyle  Try to control or lower your stress levels. Stress increases muscle tension and can worsen musculoskeletal pain. It is important to recognize when you are anxious or stressed and learn ways to manage it. This may include: ? Meditation or yoga. ? Cognitive or behavioral therapy. ? Acupuncture or massage therapy.  You may continue all activities unless the activities cause more pain. When the pain gets better, slowly resume your normal activities. Gradually increase the intensity and duration of your activities or exercise. Managing pain, stiffness, and swelling  Take over-the-counter and prescription medicines only as told by your health care provider.  When your pain is severe, bed rest may be helpful. Lie or sit in any position that is comfortable, but get out of bed and walk around at least every couple of hours.  If directed, apply heat to the affected area as often as told by your health care provider. Use the heat source that your health care provider recommends, such as a moist heat pack or a heating pad. ? Place a towel between your skin and the heat source. ? Leave the heat on for 20-30 minutes. ? Remove the heat if your skin turns bright red. This is especially important if you are unable to feel pain, heat, or cold. You may have a greater risk of getting burned.  If directed, put ice on the painful area. ? Put ice in a plastic bag. ? Place a towel between your skin and the bag. ? Leave the ice on for 20 minutes, 2-3 times a day. General instructions  Your health care provider may  recommend that you see a physical therapist. This person can help you come up with a safe exercise program. Do any exercises as told by your physical therapist.  Keep all follow-up visits, including any physical therapy visits, as told by your health care providers. This is important. Contact a health care provider if:  Your pain gets worse.  Medicines do not help ease your pain.  You cannot use the part of your body that hurts, such as your arm, leg, or neck.  You have trouble sleeping.  You have trouble doing your normal activities. Get help right away if:  You have a new injury and your pain is worse or different.  You feel numb or you have tingling in the painful area. Summary  Musculoskeletal pain refers to aches and pains in your bones, joints, muscles, and the tissues that surround them.  This pain can occur in any part of the body.  Your health care provider may recommend that you see a physical therapist. This person can help you come up with a safe exercise program. Do any exercises as told by your physical therapist.  Lower your stress level. Stress can worsen musculoskeletal pain. Ways to lower stress may include meditation, yoga, cognitive or behavioral therapy, acupuncture, and massage therapy. This information is not intended to replace advice given to you by your health care provider. Make sure you discuss any questions you have with your health care provider. Document Revised: 12/13/2016 Document Reviewed: 01/31/2016 Elsevier Patient   Education  [employer].   Second Trimester of Pregnancy  The second trimester is from week 14 through week 27 (month 4 through 6). This is often the time in pregnancy that you feel your best. Often times, morning sickness has lessened or quit. You may have more energy, and you may get hungry more often. Your unborn baby is growing rapidly. At the end of the sixth month, he or she is about 9 inches long and weighs about 1 pounds.  You will likely feel the baby move between 18 and 20 weeks of pregnancy. Follow these instructions at home: Medicines  Take over-the-counter and prescription medicines only as told by your doctor. Some medicines are safe and some medicines are not safe during pregnancy.  Take a prenatal vitamin that contains at least 600 micrograms (mcg) of folic acid.  If you have trouble pooping (constipation), take medicine that will make your stool soft (stool softener) if your doctor approves. Eating and drinking   Eat regular, healthy meals.  Avoid raw meat and uncooked cheese.  If you get low calcium from the food you eat, talk to your doctor about taking a daily calcium supplement.  Avoid foods that are high in fat and sugars, such as fried and sweet foods.  If you feel sick to your stomach (nauseous) or throw up (vomit): ? Eat 4 or 5 small meals a day instead of 3 large meals. ? Try eating a few soda crackers. ? Drink liquids between meals instead of during meals.  To prevent constipation: ? Eat foods that are high in fiber, like fresh fruits and vegetables, whole grains, and beans. ? Drink enough fluids to keep your pee (urine) clear or pale yellow. Activity  Exercise only as told by your doctor. Stop exercising if you start to have cramps.  Do not exercise if it is too hot, too humid, or if you are in a place of great height (high altitude).  Avoid heavy lifting.  Wear low-heeled shoes. Sit and stand up straight.  You can continue to have sex unless your doctor tells you not to. Relieving pain and discomfort  Wear a good support bra if your breasts are tender.  Take warm water baths (sitz baths) to soothe pain or discomfort caused by hemorrhoids. Use hemorrhoid cream if your doctor approves.  Rest with your legs raised if you have leg cramps or low back pain.  If you develop puffy, bulging veins (varicose veins) in your legs: ? Wear support hose or compression stockings as  told by your doctor. ? Raise (elevate) your feet for 15 minutes, 3-4 times a day. ? Limit salt in your food. Prenatal care  Write down your questions. Take them to your prenatal visits.  Keep all your prenatal visits as told by your doctor. This is important. Safety  Wear your seat belt when driving.  Make a list of emergency phone numbers, including numbers for family, friends, the hospital, and police and fire departments. General instructions  Ask your doctor about the right foods to eat or for help finding a counselor, if you need these services.  Ask your doctor about local prenatal classes. Begin classes before month 6 of your pregnancy.  Do not use hot tubs, steam rooms, or saunas.  Do not douche or use tampons or scented sanitary pads.  Do not cross your legs for long periods of time.  Visit your dentist if you have not done so. Use a soft toothbrush to brush your  teeth. Floss gently.  Avoid all smoking, herbs, and alcohol. Avoid drugs that are not approved by your doctor.  Do not use any products that contain nicotine or tobacco, such as cigarettes and e-cigarettes. If you need help quitting, ask your doctor.  Avoid cat litter boxes and soil used by cats. These carry germs that can cause birth defects in the baby and can cause a loss of your baby (miscarriage) or stillbirth. Contact a doctor if:  You have mild cramps or pressure in your lower belly.  You have pain when you pee (urinate).  You have bad smelling fluid coming from your vagina.  You continue to feel sick to your stomach (nauseous), throw up (vomit), or have watery poop (diarrhea).  You have a nagging pain in your belly area.  You feel dizzy. Get help right away if:  You have a fever.  You are leaking fluid from your vagina.  You have spotting or bleeding from your vagina.  You have severe belly cramping or pain.  You lose or gain weight rapidly.  You have trouble catching your breath and  have chest pain.  You notice sudden or extreme puffiness (swelling) of your face, hands, ankles, feet, or legs.  You have not felt the baby move in over an hour.  You have severe headaches that do not go away when you take medicine.  You have trouble seeing. Summary  The second trimester is from week 14 through week 27 (months 4 through 6). This is often the time in pregnancy that you feel your best.  To take care of yourself and your unborn baby, you will need to eat healthy meals, take medicines only if your doctor tells you to do so, and do activities that are safe for you and your baby.  Call your doctor if you get sick or if you notice anything unusual about your pregnancy. Also, call your doctor if you need help with the right food to eat, or if you want to know what activities are safe for you. This information is not intended to replace advice given to you by your health care provider. Make sure you discuss any questions you have with your health care provider. Document Revised: 04/24/2018 Document Reviewed: 02/06/2016 Elsevier Patient Education  2020 [employer].

## 2020-01-11 LAB — OB RESULTS CONSOLE GBS: GBS: NEGATIVE

## 2020-01-22 ENCOUNTER — Inpatient Hospital Stay (HOSPITAL_COMMUNITY)
Admission: AD | Admit: 2020-01-22 | Discharge: 2020-01-22 | Disposition: A | Discharge status: Home / Self Care | Payer: Medicaid Other | Attending: Obstetrics and Gynecology | Admitting: Obstetrics and Gynecology

## 2020-01-22 ENCOUNTER — Encounter (HOSPITAL_COMMUNITY): Payer: Self-pay | Admitting: Obstetrics and Gynecology

## 2020-01-22 ENCOUNTER — Other Ambulatory Visit: Payer: Self-pay

## 2020-01-22 DIAGNOSIS — O26893 Other specified pregnancy related conditions, third trimester: Secondary | ICD-10-CM | POA: Diagnosis not present

## 2020-01-22 DIAGNOSIS — Z3A38 38 weeks gestation of pregnancy: Secondary | ICD-10-CM | POA: Diagnosis not present

## 2020-01-22 DIAGNOSIS — Z20822 Contact with and (suspected) exposure to covid-19: Secondary | ICD-10-CM | POA: Diagnosis not present

## 2020-01-22 DIAGNOSIS — Z7982 Long term (current) use of aspirin: Secondary | ICD-10-CM | POA: Insufficient documentation

## 2020-01-22 DIAGNOSIS — O26899 Other specified pregnancy related conditions, unspecified trimester: Secondary | ICD-10-CM

## 2020-01-22 DIAGNOSIS — R0602 Shortness of breath: Secondary | ICD-10-CM | POA: Insufficient documentation

## 2020-01-22 LAB — CBC WITH DIFFERENTIAL/PLATELET
Abs Immature Granulocytes: 0.2 10*3/uL — ABNORMAL HIGH (ref 0.00–0.07)
Basophils Absolute: 0 10*3/uL (ref 0.0–0.1)
Basophils Relative: 0 %
Eosinophils Absolute: 0.3 10*3/uL (ref 0.0–0.5)
Eosinophils Relative: 2 %
HCT: 31.6 % — ABNORMAL LOW (ref 36.0–46.0)
Hemoglobin: 10.9 g/dL — ABNORMAL LOW (ref 12.0–15.0)
Immature Granulocytes: 2 %
Lymphocytes Relative: 13 %
Lymphs Abs: 1.8 10*3/uL (ref 0.7–4.0)
MCH: 30.3 pg (ref 26.0–34.0)
MCHC: 34.5 g/dL (ref 30.0–36.0)
MCV: 87.8 fL (ref 80.0–100.0)
Monocytes Absolute: 1.2 10*3/uL — ABNORMAL HIGH (ref 0.1–1.0)
Monocytes Relative: 9 %
Neutro Abs: 10.1 10*3/uL — ABNORMAL HIGH (ref 1.7–7.7)
Neutrophils Relative %: 74 %
Platelets: 322 10*3/uL (ref 150–400)
RBC: 3.6 MIL/uL — ABNORMAL LOW (ref 3.87–5.11)
RDW: 15.2 % (ref 11.5–15.5)
WBC: 13.7 10*3/uL — ABNORMAL HIGH (ref 4.0–10.5)
nRBC: 0 % (ref 0.0–0.2)

## 2020-01-22 LAB — COMPREHENSIVE METABOLIC PANEL
ALT: 13 U/L (ref 0–44)
AST: 15 U/L (ref 15–41)
Albumin: 2.7 g/dL — ABNORMAL LOW (ref 3.5–5.0)
Alkaline Phosphatase: 159 U/L — ABNORMAL HIGH (ref 38–126)
Anion gap: 12 (ref 5–15)
BUN: 5 mg/dL — ABNORMAL LOW (ref 6–20)
CO2: 21 mmol/L — ABNORMAL LOW (ref 22–32)
Calcium: 9.1 mg/dL (ref 8.9–10.3)
Chloride: 102 mmol/L (ref 98–111)
Creatinine, Ser: 0.74 mg/dL (ref 0.44–1.00)
GFR, Estimated: >60 mL/min (ref >60)
Glucose, Bld: 95 mg/dL (ref 70–99)
Potassium: 3.8 mmol/L (ref 3.5–5.1)
Sodium: 135 mmol/L (ref 135–145)
Total Bilirubin: 0.3 mg/dL (ref 0.3–1.2)
Total Protein: 6.8 g/dL (ref 6.5–8.1)

## 2020-01-22 LAB — URINALYSIS, ROUTINE W REFLEX MICROSCOPIC
Bilirubin Urine: NEGATIVE
Glucose, UA: NEGATIVE mg/dL
Hgb urine dipstick: NEGATIVE
Ketones, ur: NEGATIVE mg/dL
Nitrite: NEGATIVE
Protein, ur: NEGATIVE mg/dL
Specific Gravity, Urine: 1.016 (ref 1.005–1.030)
pH: 6 (ref 5.0–8.0)

## 2020-01-22 LAB — RESP PANEL BY RT-PCR (FLU A&B, COVID) ARPGX2
Influenza A by PCR: NEGATIVE
Influenza B by PCR: NEGATIVE
SARS Coronavirus 2 by RT PCR: NEGATIVE

## 2020-01-22 LAB — BRAIN NATRIURETIC PEPTIDE: B Natriuretic Peptide: 27.4 pg/mL (ref 0.0–100.0)

## 2020-01-22 LAB — TROPONIN I (HIGH SENSITIVITY): Troponin I (High Sensitivity): 5 ng/L (ref <18)

## 2020-01-22 NOTE — Discharge Instructions (Signed)

## 2020-01-22 NOTE — MAU Note (Signed)
Miranda Mack is a 26 y.o. at [redacted]w[redacted]d here in MAU reporting: for the past couple of hours has been having trouble breathing. States feeling SOB and that breathing is labored. Denies any other covid symptoms or sick contacts. Denies labor complaints. +FM  Onset of complaint: today  Pain score: 0/10  Vitals:   01/22/20 0708  BP: 108/87  Pulse: (!) 128  Resp: (!) 28  Temp: 98.4 F (36.9 C)  SpO2: 94%     FHT: +FM  Lab orders placed from triage: UA

## 2020-01-22 NOTE — MAU Provider Note (Signed)
History     CSN: [id number]  Arrival date and time: 01/22/20 0947   Event Date/Time   First Provider Initiated Contact with Patient 01/22/20 0800      Chief Complaint  Patient presents with  . Shortness of Breath   HPI Miranda Mack is a 25 y.o. G2P0010 at [redacted]w[redacted]d who presents with shortness of breath. She states it happened for the first time last night while she was putting away laundry. She reports feeling like she cannot take a deep breath. She also felt lightheaded one time. She denies any HA, sore throat or other COVID symptoms. She denies any chest pain or palpitations. She denies any pain, vaginal bleeding or leaking of fluid. Reports normal fetal movement. She denies any problems in the pregnancy. She reports being fully vaccinated for COVID and received her booster this week.   OB History    Gravida  2   Para      Term      Preterm      AB  1   Living        SAB  1   IAB      Ectopic      Multiple      Live Births              Past Medical History:  Diagnosis Date  . GERD (gastroesophageal reflux disease)   . Miscarriage within last 12 months   . Ovarian cyst     Past Surgical History:  Procedure Laterality Date  . APPENDECTOMY    . LAPAROSCOPIC APPENDECTOMY N/A 04/02/2017   Procedure: APPENDECTOMY LAPAROSCOPIC;  Surgeon: Darnell Level, MD;  Location: [facility];  Service: General;  Laterality: N/A;    Family History  Problem Relation Age of Onset  . Diabetes Father   . Hypertension Father   . Diabetes Mother     Social History   Tobacco Use  . Smoking status: Never Smoker  . Smokeless tobacco: Never Used  Vaping Use  . Vaping Use: Never used  Substance Use Topics  . Alcohol use: Not Currently    Comment: occ  . Drug use: No    Allergies: No Known Allergies  Medications Prior to Admission  Medication Sig Dispense Refill Last Dose  . aspirin EC 81 MG tablet Take 81 mg by mouth daily. Swallow whole.   01/21/2020 at 1500  .  ondansetron (ZOFRAN) 4 MG tablet Take 1 tablet (4 mg total) by mouth every 8 (eight) hours as needed for nausea or vomiting. 20 tablet 0 01/21/2020 at 1500  . Prenatal Vit-Fe Fumarate-FA (MULTIVITAMIN-PRENATAL) 27-0.8 MG TABS tablet Take 1 tablet by mouth daily at 12 noon.   01/21/2020 at 1500  . citalopram (CELEXA) 40 MG tablet Take 1 tablet by mouth once daily   More than a month at Unknown time  . cyclobenzaprine (FLEXERIL) 10 MG tablet Take 1 tablet (10 mg total) by mouth every 8 (eight) hours as needed for muscle spasms. 30 tablet 1   . diphenhydrAMINE (BENADRYL) 25 mg capsule Take 25 mg by mouth every 6 (six) hours as needed for allergies.   More than a month at Unknown time  . Multiple Vitamin (MULTIVITAMIN WITH MINERALS) TABS tablet Take 1 tablet by mouth daily.   More than a month at Unknown time    Review of Systems  Constitutional: Negative.  Negative for fatigue and fever.  HENT: Negative.   Respiratory: Positive for shortness of breath. Negative for cough and chest tightness.  Cardiovascular: Negative.  Negative for chest pain and palpitations.  Gastrointestinal: Negative.  Negative for abdominal pain, constipation, diarrhea, nausea and vomiting.  Genitourinary: Negative.  Negative for dysuria, vaginal bleeding and vaginal discharge.  Neurological: Negative.  Negative for dizziness and headaches.   Physical Exam   Blood pressure 125/76, pulse (!) 101, temperature 98.1 F (36.7 C), temperature source Oral, resp. rate (!) 21, height 5\' 3"  (1.6 m), weight 129.4 kg, last menstrual period 02/11/2019, SpO2 92 %.  Physical Exam Vitals and nursing note reviewed.  Constitutional:      General: She is not in acute distress.    Appearance: She is well-developed and well-nourished.  HENT:     Head: Normocephalic.  Eyes:     Pupils: Pupils are equal, round, and reactive to light.  Cardiovascular:     Rate and Rhythm: Normal rate and regular rhythm.     Heart sounds: Normal heart sounds.   Pulmonary:     Effort: Pulmonary effort is normal. No tachypnea or respiratory distress.     Breath sounds: Normal breath sounds. No decreased breath sounds, wheezing, rhonchi or rales.  Abdominal:     General: Bowel sounds are normal. There is no distension.     Palpations: Abdomen is soft.     Tenderness: There is no abdominal tenderness.  Skin:    General: Skin is warm and dry.  Neurological:     Mental Status: She is alert and oriented to person, place, and time.  Psychiatric:        Mood and Affect: Mood and affect normal.        Behavior: Behavior normal.        Thought Content: Thought content normal.        Judgment: Judgment normal.    Fetal Tracing:  Baseline: 130 Variability: moderate Accels: 15x15 Decels: none  Toco: occasional uc's    MAU Course  Procedures Results for orders placed or performed during the hospital encounter of 01/22/20 (from the past 24 hour(s))  Urinalysis, Routine w reflex microscopic Urine, Clean Catch     Status: Abnormal   Collection Time: 01/22/20  7:33 AM  Result Value Ref Range   Color, Urine YELLOW YELLOW   APPearance HAZY (A) CLEAR   Specific Gravity, Urine 1.016 1.005 - 1.030   pH 6.0 5.0 - 8.0   Glucose, UA NEGATIVE NEGATIVE mg/dL   Hgb urine dipstick NEGATIVE NEGATIVE   Bilirubin Urine NEGATIVE NEGATIVE   Ketones, ur NEGATIVE NEGATIVE mg/dL   Protein, ur NEGATIVE NEGATIVE mg/dL   Nitrite NEGATIVE NEGATIVE   Leukocytes,Ua SMALL (A) NEGATIVE   RBC / HPF 0-5 0 - 5 RBC/hpf   WBC, UA 6-10 0 - 5 WBC/hpf   Bacteria, UA MANY (A) NONE SEEN   Squamous Epithelial / LPF 11-20 0 - 5   Mucus PRESENT   CBC with Differential/Platelet     Status: Abnormal   Collection Time: 01/22/20  7:46 AM  Result Value Ref Range   WBC 13.7 (H) 4.0 - 10.5 K/uL   RBC 3.60 (L) 3.87 - 5.11 MIL/uL   Hemoglobin 10.9 (L) 12.0 - 15.0 g/dL   HCT 03/21/20 (L) 30.8 - 65.7 %   MCV 87.8 80.0 - 100.0 fL   MCH 30.3 26.0 - 34.0 pg   MCHC 34.5 30.0 - 36.0 g/dL    RDW 84.6 96.2 - 95.2 %   Platelets 322 150 - 400 K/uL   nRBC 0.0 0.0 - 0.2 %   Neutrophils Relative %  74 %   Neutro Abs 10.1 (H) 1.7 - 7.7 K/uL   Lymphocytes Relative 13 %   Lymphs Abs 1.8 0.7 - 4.0 K/uL   Monocytes Relative 9 %   Monocytes Absolute 1.2 (H) 0.1 - 1.0 K/uL   Eosinophils Relative 2 %   Eosinophils Absolute 0.3 0.0 - 0.5 K/uL   Basophils Relative 0 %   Basophils Absolute 0.0 0.0 - 0.1 K/uL   Immature Granulocytes 2 %   Abs Immature Granulocytes 0.20 (H) 0.00 - 0.07 K/uL  Comprehensive metabolic panel     Status: Abnormal   Collection Time: 01/22/20  7:46 AM  Result Value Ref Range   Sodium 135 135 - 145 mmol/L   Potassium 3.8 3.5 - 5.1 mmol/L   Chloride 102 98 - 111 mmol/L   CO2 21 (L) 22 - 32 mmol/L   Glucose, Bld 95 70 - 99 mg/dL   BUN 5 (L) 6 - 20 mg/dL   Creatinine, Ser 0.73 0.44 - 1.00 mg/dL   Calcium 9.1 8.9 - 71.0 mg/dL   Total Protein 6.8 6.5 - 8.1 g/dL   Albumin 2.7 (L) 3.5 - 5.0 g/dL   AST 15 15 - 41 U/L   ALT 13 0 - 44 U/L   Alkaline Phosphatase 159 (H) 38 - 126 U/L   Total Bilirubin 0.3 0.3 - 1.2 mg/dL   GFR, Estimated >62 >69 mL/min   Anion gap 12 5 - 15  Troponin I (High Sensitivity)     Status: None   Collection Time: 01/22/20  7:46 AM  Result Value Ref Range   Troponin I (High Sensitivity) 5 <18 ng/L  Brain natriuretic peptide     Status: None   Collection Time: 01/22/20  7:46 AM  Result Value Ref Range   B Natriuretic Peptide 27.4 0.0 - 100.0 pg/mL  Resp Panel by RT-PCR (Flu A&B, Covid) Nasopharyngeal Swab     Status: None   Collection Time: 01/22/20  7:55 AM   Specimen: Nasopharyngeal Swab; Nasopharyngeal(NP) swabs in vial transport medium  Result Value Ref Range   SARS Coronavirus 2 by RT PCR NEGATIVE NEGATIVE   Influenza A by PCR NEGATIVE NEGATIVE   Influenza B by PCR NEGATIVE NEGATIVE     MDM UA CBC, CMP BNP, Troponin COVID-19 swab  Discussed with patient normal results and normalcy of some dyspnea during the third trimester  of pregnancy. Reviewed comfort measures at length and warning signs of when to return to MAU.   Assessment and Plan   1. Shortness of breath due to pregnancy   2. [redacted] weeks gestation of pregnancy    -Discharge home in stable condition -Labor precautions discussed -Patient advised to follow-up with OB as scheduled for prenatal care -Patient may return to MAU as needed or if her condition were to change or worsen   Rolm Bookbinder CNM 01/22/2020, 9:38 AM

## 2020-01-25 ENCOUNTER — Inpatient Hospital Stay (HOSPITAL_COMMUNITY)
Admission: AD | Admit: 2020-01-25 | Discharge: 2020-01-25 | Disposition: A | Discharge status: Home / Self Care | Payer: Medicaid Other | Attending: Obstetrics and Gynecology | Admitting: Obstetrics and Gynecology

## 2020-01-25 ENCOUNTER — Other Ambulatory Visit: Payer: Self-pay

## 2020-01-25 ENCOUNTER — Encounter (HOSPITAL_COMMUNITY): Payer: Self-pay | Admitting: Obstetrics and Gynecology

## 2020-01-25 DIAGNOSIS — O99213 Obesity complicating pregnancy, third trimester: Secondary | ICD-10-CM | POA: Insufficient documentation

## 2020-01-25 DIAGNOSIS — Z7982 Long term (current) use of aspirin: Secondary | ICD-10-CM | POA: Insufficient documentation

## 2020-01-25 DIAGNOSIS — Z79899 Other long term (current) drug therapy: Secondary | ICD-10-CM | POA: Diagnosis not present

## 2020-01-25 DIAGNOSIS — O368130 Decreased fetal movements, third trimester, not applicable or unspecified: Secondary | ICD-10-CM | POA: Diagnosis present

## 2020-01-25 DIAGNOSIS — Z3A38 38 weeks gestation of pregnancy: Secondary | ICD-10-CM | POA: Diagnosis not present

## 2020-01-25 DIAGNOSIS — Z833 Family history of diabetes mellitus: Secondary | ICD-10-CM | POA: Diagnosis not present

## 2020-01-25 DIAGNOSIS — Z3689 Encounter for other specified antenatal screening: Secondary | ICD-10-CM

## 2020-01-25 NOTE — MAU Note (Signed)
Sent from [facility] office for fetal monitoring.  Reports had monitoring @ office visit and while on monitor baby's heart was going down with ctxs.  Denies VB or LOF.  Endorses +FM, but less than 4x in 1 hour and 10x in 2 hours.

## 2020-01-25 NOTE — MAU Provider Note (Signed)
History     CSN: [id number]  Arrival date and time: 01/25/20 1642   Event Date/Time   First Provider Initiated Contact with Patient 01/25/20 1717      Chief Complaint  Patient presents with  . EFM   HPI This is a 24yo G2P0 at [redacted]w[redacted]d. Pregnancy complicated by BMI of 50. She is followed by [facility]. She has been getting an NST once a week due to maternal obesity. She has decreased fetal movement today - has been feeling less than 4 movements and hour and less than 10 movements in 2 hours. She was on the monitor earlier today and was noted to have some contractions with possible variable decelerations with the contractions.   Having occasional mild contractions. No leaking fluid, vaginal bleeding, nausea, vomiting.  OB History    Gravida  2   Para      Term      Preterm      AB  1   Living        SAB  1   IAB      Ectopic      Multiple      Live Births              Past Medical History:  Diagnosis Date  . GERD (gastroesophageal reflux disease)   . Miscarriage within last 12 months   . Ovarian cyst     Past Surgical History:  Procedure Laterality Date  . APPENDECTOMY    . LAPAROSCOPIC APPENDECTOMY N/A 04/02/2017   Procedure: APPENDECTOMY LAPAROSCOPIC;  Surgeon: Darnell Level, MD;  Location: [facility];  Service: General;  Laterality: N/A;    Family History  Problem Relation Age of Onset  . Diabetes Father   . Hypertension Father   . Diabetes Mother     Social History   Tobacco Use  . Smoking status: Never Smoker  . Smokeless tobacco: Never Used  Vaping Use  . Vaping Use: Never used  Substance Use Topics  . Alcohol use: Not Currently    Comment: occ  . Drug use: No    Allergies:  Allergies  Allergen Reactions  . Fish Allergy Anaphylaxis  . Peanut-Containing Drug Products Anaphylaxis    Medications Prior to Admission  Medication Sig Dispense Refill Last Dose  . aspirin EC 81 MG tablet Take 81 mg by mouth daily. Swallow whole.    01/24/2020 at Unknown time  . ondansetron (ZOFRAN) 4 MG tablet Take 1 tablet (4 mg total) by mouth every 8 (eight) hours as needed for nausea or vomiting. 20 tablet 0 01/24/2020 at Unknown time  . Prenatal Vit-Fe Fumarate-FA (MULTIVITAMIN-PRENATAL) 27-0.8 MG TABS tablet Take 1 tablet by mouth daily at 12 noon.   01/24/2020 at Unknown time  . citalopram (CELEXA) 40 MG tablet Take 1 tablet by mouth once daily   More than a month at Unknown time  . cyclobenzaprine (FLEXERIL) 10 MG tablet Take 1 tablet (10 mg total) by mouth every 8 (eight) hours as needed for muscle spasms. 30 tablet 1 More than a month at Unknown time  . diphenhydrAMINE (BENADRYL) 25 mg capsule Take 25 mg by mouth every 6 (six) hours as needed for allergies.   More than a month at Unknown time  . Multiple Vitamin (MULTIVITAMIN WITH MINERALS) TABS tablet Take 1 tablet by mouth daily.       Review of Systems Physical Exam   Blood pressure 117/69, pulse (!) 119, temperature 98.4 F (36.9 C), temperature source Oral,  resp. rate 20, height 5\' 3"  (1.6 m), weight 129.7 kg, last menstrual period 02/11/2019, SpO2 99 %.  Physical Exam Vitals reviewed.  Constitutional:      Appearance: Normal appearance.  HENT:     Head: Normocephalic and atraumatic.  Pulmonary:     Effort: Pulmonary effort is normal.  Abdominal:     General: There is no distension.     Palpations: Abdomen is soft. There is no mass.     Tenderness: There is no abdominal tenderness. There is no guarding or rebound.     Hernia: No hernia is present.  Skin:    General: Skin is warm and dry.     Capillary Refill: Capillary refill takes less than 2 seconds.  Neurological:     General: No focal deficit present.     Mental Status: She is alert.  Psychiatric:        Mood and Affect: Mood normal.        Behavior: Behavior normal.        Thought Content: Thought content normal.        Judgment: Judgment normal.     MAU Course  Procedures NST 140s, moderate  variabiltiy, multiple accerations, no decels. No contractions seen. Patient feeling good fetal movement.  MDM  Assessment and Plan  1. [redacted] weeks gestation of pregnancy  2. NST (non-stress test) reactive  3. Decreased fetal movements in third trimester, single or unspecified fetus  Patient with reassuring fetal testing. I discussed patient with Dr 02/13/2019 and discussed patient with her. No contractions seen, so I am unable to see whether there are decels with contractions. Dr Timothy Lasso is comfortable with the patient going home and will ensure that the patient has followup for a repeat BPP in the next couple of days.   Return precautions given.  Timothy Lasso 01/25/2020, 5:17 PM

## 2020-01-25 NOTE — Discharge Instructions (Signed)
Fetal Movement Counts Patient Name: ________________________________________________ Patient Due Date: ____________________  What is a fetal movement count? A fetal movement count is the number of times that you feel your baby move during a certain amount of time. This may also be called a fetal kick count. A fetal movement count is recommended for every pregnant woman. You may be asked to start counting fetal movements as early as week 28 of your pregnancy. Pay attention to when your baby is most active. You may notice your baby's sleep and wake cycles. You may also notice things that make your baby move more. You should do a fetal movement count:  When your baby is normally most active.  At the same time each day. A good time to count movements is while you are resting, after having something to eat and drink. How do I count fetal movements? 1. Find a quiet, comfortable area. Sit, or lie down on your side. 2. Write down the date, the start time and stop time, and the number of movements that you felt between those two times. Take this information with you to your health care visits. 3. Write down your start time when you feel the first movement. 4. Count kicks, flutters, swishes, rolls, and jabs. You should feel at least 10 movements. 5. You may stop counting after you have felt 10 movements, or if you have been counting for 2 hours. Write down the stop time. 6. If you do not feel 10 movements in 2 hours, contact your health care provider for further instructions. Your health care provider may want to do additional tests to assess your baby's well-being. Contact a health care provider if:  You feel fewer than 10 movements in 2 hours.  Your baby is not moving like he or she usually does. Date: ____________ Start time: ____________ Stop time: ____________ Movements: ____________ Date: ____________ Start time: ____________ Stop time: ____________ Movements: ____________ Date: ____________  Start time: ____________ Stop time: ____________ Movements: ____________ Date: ____________ Start time: ____________ Stop time: ____________ Movements: ____________ Date: ____________ Start time: ____________ Stop time: ____________ Movements: ____________ Date: ____________ Start time: ____________ Stop time: ____________ Movements: ____________ Date: ____________ Start time: ____________ Stop time: ____________ Movements: ____________ Date: ____________ Start time: ____________ Stop time: ____________ Movements: ____________ Date: ____________ Start time: ____________ Stop time: ____________ Movements: ____________ This information is not intended to replace advice given to you by your health care provider. Make sure you discuss any questions you have with your health care provider. Document Revised: 08/20/2018 Document Reviewed: 08/20/2018 Elsevier Patient Education  2021 [employer]  

## 2020-02-02 ENCOUNTER — Encounter (HOSPITAL_COMMUNITY): Payer: Self-pay | Admitting: Obstetrics and Gynecology

## 2020-02-02 ENCOUNTER — Inpatient Hospital Stay (HOSPITAL_COMMUNITY)
Admission: AD | Admit: 2020-02-02 | Discharge: 2020-02-06 | DRG: 788 | Disposition: A | Discharge status: Home / Self Care | Payer: Medicaid Other | Attending: Obstetrics & Gynecology | Admitting: Obstetrics & Gynecology

## 2020-02-02 ENCOUNTER — Other Ambulatory Visit: Payer: Self-pay | Admitting: Obstetrics and Gynecology

## 2020-02-02 DIAGNOSIS — O99214 Obesity complicating childbirth: Principal | ICD-10-CM | POA: Diagnosis present

## 2020-02-02 DIAGNOSIS — Z20822 Contact with and (suspected) exposure to covid-19: Secondary | ICD-10-CM | POA: Diagnosis present

## 2020-02-02 DIAGNOSIS — Z3A39 39 weeks gestation of pregnancy: Secondary | ICD-10-CM

## 2020-02-02 HISTORY — DX: 39 weeks gestation of pregnancy: Z3A.39

## 2020-02-02 LAB — TYPE AND SCREEN
ABO/RH(D): O POS
Antibody Screen: NEGATIVE

## 2020-02-02 LAB — CBC
HCT: 35.3 % — ABNORMAL LOW (ref 36.0–46.0)
Hemoglobin: 11.2 g/dL — ABNORMAL LOW (ref 12.0–15.0)
MCH: 28.4 pg (ref 26.0–34.0)
MCHC: 31.7 g/dL (ref 30.0–36.0)
MCV: 89.6 fL (ref 80.0–100.0)
Platelets: 333 10*3/uL (ref 150–400)
RBC: 3.94 MIL/uL (ref 3.87–5.11)
RDW: 15.4 % (ref 11.5–15.5)
WBC: 15.6 10*3/uL — ABNORMAL HIGH (ref 4.0–10.5)
nRBC: 0 % (ref 0.0–0.2)

## 2020-02-02 LAB — SARS CORONAVIRUS 2 (TAT 6-24 HRS): SARS Coronavirus 2: NEGATIVE

## 2020-02-02 MED ORDER — LACTATED RINGERS IV SOLN
500.0000 mL | INTRAVENOUS | Status: DC | PRN
Start: 2020-02-02 — End: 2020-02-04
  Administered 2020-02-02 (×3): 500 mL via INTRAVENOUS

## 2020-02-02 MED ORDER — EPHEDRINE 5 MG/ML INJ
10.0000 mg | INTRAVENOUS | Status: DC | PRN
  Filled 2020-02-02: qty 10

## 2020-02-02 MED ORDER — PHENYLEPHRINE 40 MCG/ML (10ML) SYRINGE FOR IV PUSH (FOR BLOOD PRESSURE SUPPORT): 80.0000 ug | PREFILLED_SYRINGE | INTRAVENOUS | Status: DC | PRN

## 2020-02-02 MED ORDER — ACETAMINOPHEN 325 MG PO TABS: 650.0000 mg | ORAL_TABLET | ORAL | Status: DC | PRN

## 2020-02-02 MED ORDER — OXYCODONE-ACETAMINOPHEN 5-325 MG PO TABS: 2.0000 | ORAL_TABLET | ORAL | Status: DC | PRN

## 2020-02-02 MED ORDER — ONDANSETRON HCL 4 MG/2ML IJ SOLN
4.0000 mg | Freq: Four times a day (QID) | INTRAMUSCULAR | Status: DC | PRN
  Administered 2020-02-02 – 2020-02-03 (×4): 4 mg via INTRAVENOUS
  Filled 2020-02-02 (×4): qty 2

## 2020-02-02 MED ORDER — LIDOCAINE HCL (PF) 1 % IJ SOLN: 30.0000 mL | INTRAMUSCULAR | Status: DC | PRN

## 2020-02-02 MED ORDER — FENTANYL-BUPIVACAINE-NACL 0.5-0.125-0.9 MG/250ML-% EP SOLN
12.0000 mL/h | EPIDURAL | Status: DC | PRN
  Administered 2020-02-03: 12 mL/h via EPIDURAL
  Filled 2020-02-02 (×2): qty 250

## 2020-02-02 MED ORDER — FENTANYL CITRATE (PF) 100 MCG/2ML IJ SOLN
50.0000 ug | INTRAMUSCULAR | Status: DC | PRN
  Administered 2020-02-02 (×2): 100 ug via INTRAVENOUS
  Filled 2020-02-02 (×3): qty 2

## 2020-02-02 MED ORDER — OXYTOCIN-SODIUM CHLORIDE 30-0.9 UT/500ML-% IV SOLN
2.5000 [IU]/h | INTRAVENOUS | Status: DC
  Filled 2020-02-02: qty 500

## 2020-02-02 MED ORDER — EPHEDRINE 5 MG/ML INJ
10.0000 mg | INTRAVENOUS | Status: DC | PRN
  Administered 2020-02-03: 10 mg via INTRAVENOUS

## 2020-02-02 MED ORDER — SOD CITRATE-CITRIC ACID 500-334 MG/5ML PO SOLN
30.0000 mL | ORAL | Status: DC | PRN
  Filled 2020-02-02: qty 15

## 2020-02-02 MED ORDER — LACTATED RINGERS IV SOLN: INTRAVENOUS | Status: DC

## 2020-02-02 MED ORDER — OXYTOCIN BOLUS FROM INFUSION: 333.0000 mL | Freq: Once | INTRAVENOUS | Status: DC

## 2020-02-02 MED ORDER — LACTATED RINGERS IV SOLN
500.0000 mL | Freq: Once | INTRAVENOUS | Status: AC
  Administered 2020-02-03: 500 mL via INTRAVENOUS

## 2020-02-02 MED ORDER — TERBUTALINE SULFATE 1 MG/ML IJ SOLN: 0.2500 mg | Freq: Once | INTRAMUSCULAR | Status: DC | PRN

## 2020-02-02 MED ORDER — DIPHENHYDRAMINE HCL 50 MG/ML IJ SOLN: 12.5000 mg | INTRAMUSCULAR | Status: DC | PRN

## 2020-02-02 MED ORDER — MISOPROSTOL 25 MCG QUARTER TABLET
25.0000 ug | ORAL_TABLET | ORAL | Status: DC | PRN
  Administered 2020-02-02 – 2020-02-03 (×3): 25 ug via VAGINAL
  Filled 2020-02-02 (×3): qty 1

## 2020-02-02 MED ORDER — OXYCODONE-ACETAMINOPHEN 5-325 MG PO TABS
1.0000 | ORAL_TABLET | ORAL | Status: DC | PRN
Start: 2020-02-02 — End: 2020-02-04

## 2020-02-02 NOTE — Plan of Care (Signed)
  Problem: Education: Goal: Knowledge of Childbirth will improve Outcome: Progressing   

## 2020-02-02 NOTE — H&P (Signed)
Miranda Mack is a 25 y.o. female presenting for IOL  25 yo G2P0010 @ 39+6 presents from office for IOL for variables on an office NST for obesity. Her pregnancy has been complicated by BMI 50.  Last EFW on 01/28/20 7#10, AFI 12 OB History    Gravida  2   Para      Term      Preterm      AB  1   Living        SAB  1   IAB      Ectopic      Multiple      Live Births             Past Medical History:  Diagnosis Date  . GERD (gastroesophageal reflux disease)   . Miscarriage within last 12 months   . Ovarian cyst    Past Surgical History:  Procedure Laterality Date  . APPENDECTOMY    . LAPAROSCOPIC APPENDECTOMY N/A 04/02/2017   Procedure: APPENDECTOMY LAPAROSCOPIC;  Surgeon: Darnell Level, MD;  Location: [facility];  Service: General;  Laterality: N/A;   Family History: family history includes Diabetes in her father and mother; Hypertension in her father. Social History:  reports that she has never smoked. She has never used smokeless tobacco. She reports previous alcohol use. She reports that she does not use drugs.     Maternal Diabetes: No Genetic Screening: Normal Maternal Ultrasounds/Referrals: Normal Fetal Ultrasounds or other Referrals:  None Maternal Substance Abuse:  No Significant Maternal Medications:  None Significant Maternal Lab Results:  None Other Comments:  None  Review of Systems History   Height 5\' 3"  (1.6 m), weight 130.3 kg, last menstrual period 02/11/2019. Exam Physical Exam  Prenatal labs: ABO, Rh: --/--/PENDING (01/19 1645) Antibody: PENDING (01/19 1645) Rubella:  Imm RPR:   NR HBsAg:   Neg HIV:   NR GBS:   Neg  Assessment/Plan: 1) Admit 2) Misoprostal 11-08-1997 Q 4 hrs PV 3) Epidural on request   02/02/2020, 5:02 PM

## 2020-02-03 ENCOUNTER — Encounter (HOSPITAL_COMMUNITY): Payer: Self-pay | Admitting: Obstetrics and Gynecology

## 2020-02-03 ENCOUNTER — Inpatient Hospital Stay (HOSPITAL_COMMUNITY): Payer: Medicaid Other | Admitting: Anesthesiology

## 2020-02-03 ENCOUNTER — Inpatient Hospital Stay (HOSPITAL_COMMUNITY): Admit: 2020-02-03 | Payer: Self-pay

## 2020-02-03 LAB — RPR: RPR Ser Ql: NONREACTIVE

## 2020-02-03 MED ORDER — TERBUTALINE SULFATE 1 MG/ML IJ SOLN: 0.2500 mg | Freq: Once | INTRAMUSCULAR | Status: DC | PRN

## 2020-02-03 MED ORDER — LIDOCAINE HCL (PF) 1 % IJ SOLN
INTRAMUSCULAR | Status: DC | PRN
  Administered 2020-02-03 (×2): 4 mL via EPIDURAL

## 2020-02-03 MED ORDER — SODIUM CHLORIDE (PF) 0.9 % IJ SOLN
INTRAMUSCULAR | Status: DC | PRN
  Administered 2020-02-03: 12 mL/h via EPIDURAL

## 2020-02-03 MED ORDER — OXYTOCIN-SODIUM CHLORIDE 30-0.9 UT/500ML-% IV SOLN
1.0000 m[IU]/min | INTRAVENOUS | Status: DC
  Administered 2020-02-03: 15 m[IU]/min via INTRAVENOUS
  Administered 2020-02-03: 2 m[IU]/min via INTRAVENOUS

## 2020-02-03 NOTE — Progress Notes (Signed)
OBGYN Note Miranda Mack 25 y.o. G2P0010 at [redacted]w[redacted]d for IOL 2/2 term and variables on monitor in office Notified patient has been having some late variables, position change, fluid bolus. Held pitocin, SVE by RN was unchanged after 4 hours - 6cm/60/-2 On my exam, SVE 5/60/-2, IUPC and FSE placed.  FHR 150, moderate variability, +accels, intermittent late decels Toco q2-39mins -Resume pitocin -Continue to monitor [name] K Taam-Akelman 02/03/20 8:51 PM

## 2020-02-03 NOTE — Anesthesia Procedure Notes (Signed)
Epidural Patient location during procedure: OB Start time: 02/03/2020 12:58 AM End time: 02/03/2020 1:06 AM  Staffing Anesthesiologist: Mal Amabile, MD Performed: anesthesiologist   Preanesthetic Checklist Completed: patient identified, IV checked, site marked, risks and benefits discussed, surgical consent, monitors and equipment checked, pre-op evaluation and timeout performed  Epidural Patient position: sitting Prep: DuraPrep and site prepped and draped Patient monitoring: continuous pulse ox and blood pressure Approach: midline Location: L3-L4 Injection technique: LOR air  Needle:  Needle type: Tuohy  Needle gauge: 17 G Needle length: 9 cm and 9 Needle insertion depth: 8 cm Catheter type: closed end flexible Catheter size: 19 Gauge Catheter at skin depth: 13 cm Test dose: negative and Other  Assessment Events: blood not aspirated, injection not painful, no injection resistance, no paresthesia and negative IV test  Additional Notes Patient identified. Risks and benefits discussed including failed block, incomplete  Pain control, post dural puncture headache, nerve damage, paralysis, blood pressure Changes, nausea, vomiting, reactions to medications-both toxic and allergic and post Partum back pain. All questions were answered. Patient expressed understanding and wished to proceed. Sterile technique was used throughout procedure. Epidural site was Dressed with sterile barrier dressing. No paresthesias, signs of intravascular injection Or signs of intrathecal spread were encountered.  Patient was more comfortable after the epidural was dosed. Please see RN's note for documentation of vital signs and FHR which are stable. Reason for block:procedure for pain

## 2020-02-03 NOTE — Progress Notes (Addendum)
OBGYN Note S: Doing well, comfortable with epidural Vitals:   02/03/20 0536 02/03/20 0601 02/03/20 0631 02/03/20 0701  BP: (!) 101/48 (!) 103/56 120/85 112/70  Pulse: 70 74 95 95  Resp: 17     Temp:      TempSrc:      Weight:      Height:       SVE: 2/50/-2, FB placed FHR 135, Cat 1. Toco irritable   Miranda Mack 24 y.o. G2P0010 at [redacted]w[redacted]d here for IOL at term in setting of fetal decels in clinic 1. IOL: s/p cytotec x3, now FB placed and start pitocin 2. Obesity BMI 50.89, hgb 11.2, fundal placenta, EFW 57% 3. Vaccines: received tdap, COVID booster and flu vaccine Miranda Mack 02/03/20 8:46 AM

## 2020-02-03 NOTE — Anesthesia Preprocedure Evaluation (Signed)
Anesthesia Evaluation  Patient identified by MRN, date of birth, ID band Patient awake    Reviewed: Allergy & Precautions, Patient's Chart, lab work & pertinent test results  Airway Mallampati: II  TM Distance: >3 FB Neck ROM: Full    Dental no notable dental hx. (+) Teeth Intact   Pulmonary neg pulmonary ROS,    Pulmonary exam normal breath sounds clear to auscultation       Cardiovascular negative cardio ROS Normal cardiovascular exam Rhythm:Regular Rate:Normal     Neuro/Psych negative neurological ROS  negative psych ROS   GI/Hepatic Neg liver ROS, GERD  Medicated,  Endo/Other  Morbid obesity  Renal/GU negative Renal ROS  negative genitourinary   Musculoskeletal negative musculoskeletal ROS (+)   Abdominal (+) + obese,   Peds  Hematology  (+) anemia ,   Anesthesia Other Findings   Reproductive/Obstetrics (+) Pregnancy                             Anesthesia Physical Anesthesia Plan  ASA: III  Anesthesia Plan: Epidural   Post-op Pain Management:    Induction:   PONV Risk Score and Plan:   Airway Management Planned: Natural Airway  Additional Equipment:   Intra-op Plan:   Post-operative Plan:   Informed Consent: I have reviewed the patients History and Physical, chart, labs and discussed the procedure including the risks, benefits and alternatives for the proposed anesthesia with the patient or authorized representative who has indicated his/her understanding and acceptance.       Plan Discussed with: Anesthesiologist  Anesthesia Plan Comments:         Anesthesia Quick Evaluation

## 2020-02-03 NOTE — Progress Notes (Signed)
OBGYN Note Miranda Mack 24 y.o. G2P0010 at [redacted]w[redacted]d here for IOL BP (!) 105/53   Pulse 82   Temp 97.9 F (36.6 C) (Axillary)   Resp 17   Ht 5\' 3"  (1.6 m)   Wt 130.3 kg   LMP 02/11/2019   BMI 50.89 kg/m  SVE 5/50/-2, AROM with thin mec.  FHR 135, Cat 1 Toco q2-62m -Continue pitocin Aengus Sauceda K Taam-Akelman 2:59 PM 02/03/20

## 2020-02-04 ENCOUNTER — Encounter (HOSPITAL_COMMUNITY)
Admission: AD | Disposition: A | Discharge status: Home / Self Care | Payer: Self-pay | Source: Home / Self Care | Attending: Obstetrics & Gynecology

## 2020-02-04 ENCOUNTER — Encounter (HOSPITAL_COMMUNITY): Payer: Self-pay | Admitting: Obstetrics and Gynecology

## 2020-02-04 LAB — BASIC METABOLIC PANEL
Anion gap: 9 (ref 5–15)
BUN: 7 mg/dL (ref 6–20)
CO2: 22 mmol/L (ref 22–32)
Calcium: 9 mg/dL (ref 8.9–10.3)
Chloride: 104 mmol/L (ref 98–111)
Creatinine, Ser: 0.9 mg/dL (ref 0.44–1.00)
GFR, Estimated: >60 mL/min (ref >60)
Glucose, Bld: 131 mg/dL — ABNORMAL HIGH (ref 70–99)
Potassium: 4.5 mmol/L (ref 3.5–5.1)
Sodium: 135 mmol/L (ref 135–145)

## 2020-02-04 LAB — CBC
HCT: 30.6 % — ABNORMAL LOW (ref 36.0–46.0)
Hemoglobin: 9.7 g/dL — ABNORMAL LOW (ref 12.0–15.0)
MCH: 28.5 pg (ref 26.0–34.0)
MCHC: 31.7 g/dL (ref 30.0–36.0)
MCV: 90 fL (ref 80.0–100.0)
Platelets: 290 10*3/uL (ref 150–400)
RBC: 3.4 MIL/uL — ABNORMAL LOW (ref 3.87–5.11)
RDW: 15.5 % (ref 11.5–15.5)
WBC: 27.2 10*3/uL — ABNORMAL HIGH (ref 4.0–10.5)
nRBC: 0 % (ref 0.0–0.2)

## 2020-02-04 SURGERY — Surgical Case
Anesthesia: Epidural | Wound class: Clean Contaminated

## 2020-02-04 MED ORDER — ENOXAPARIN SODIUM 80 MG/0.8ML ~~LOC~~ SOLN
0.5000 mg/kg | SUBCUTANEOUS | Status: DC
  Administered 2020-02-04 – 2020-02-05 (×2): 65 mg via SUBCUTANEOUS
  Filled 2020-02-04 (×2): qty 0.8

## 2020-02-04 MED ORDER — LIDOCAINE HCL (PF) 2 % IJ SOLN
INTRAMUSCULAR | Status: AC
  Filled 2020-02-04: qty 5

## 2020-02-04 MED ORDER — OXYTOCIN-SODIUM CHLORIDE 30-0.9 UT/500ML-% IV SOLN
2.5000 [IU]/h | INTRAVENOUS | Status: AC
  Administered 2020-02-04: 2.5 [IU]/h via INTRAVENOUS

## 2020-02-04 MED ORDER — KETOROLAC TROMETHAMINE 30 MG/ML IJ SOLN: 30.0000 mg | Freq: Four times a day (QID) | INTRAMUSCULAR | Status: AC | PRN

## 2020-02-04 MED ORDER — DIPHENHYDRAMINE HCL 50 MG/ML IJ SOLN: 12.5000 mg | INTRAMUSCULAR | Status: DC | PRN

## 2020-02-04 MED ORDER — KETOROLAC TROMETHAMINE 30 MG/ML IJ SOLN
30.0000 mg | Freq: Four times a day (QID) | INTRAMUSCULAR | Status: AC
  Administered 2020-02-04 – 2020-02-05 (×3): 30 mg via INTRAVENOUS
  Filled 2020-02-04 (×4): qty 1

## 2020-02-04 MED ORDER — SENNOSIDES-DOCUSATE SODIUM 8.6-50 MG PO TABS
2.0000 | ORAL_TABLET | ORAL | Status: DC
  Administered 2020-02-04 – 2020-02-06 (×2): 2 via ORAL
  Filled 2020-02-04 (×3): qty 2

## 2020-02-04 MED ORDER — DIPHENHYDRAMINE HCL 25 MG PO CAPS: 25.0000 mg | ORAL_CAPSULE | Freq: Four times a day (QID) | ORAL | Status: DC | PRN

## 2020-02-04 MED ORDER — FENTANYL CITRATE (PF) 250 MCG/5ML IJ SOLN
INTRAMUSCULAR | Status: AC
  Filled 2020-02-04: qty 5

## 2020-02-04 MED ORDER — MENTHOL 3 MG MT LOZG: 1.0000 | LOZENGE | OROMUCOSAL | Status: DC | PRN

## 2020-02-04 MED ORDER — NALBUPHINE HCL 10 MG/ML IJ SOLN: 5.0000 mg | Freq: Once | INTRAMUSCULAR | Status: DC | PRN

## 2020-02-04 MED ORDER — ONDANSETRON HCL 4 MG/2ML IJ SOLN
INTRAMUSCULAR | Status: DC | PRN
  Administered 2020-02-04: 4 mg via INTRAVENOUS

## 2020-02-04 MED ORDER — TRANEXAMIC ACID-NACL 1000-0.7 MG/100ML-% IV SOLN
INTRAVENOUS | Status: AC
  Filled 2020-02-04: qty 100

## 2020-02-04 MED ORDER — FLEET ENEMA 7-19 GM/118ML RE ENEM: 1.0000 | ENEMA | Freq: Every day | RECTAL | Status: DC | PRN

## 2020-02-04 MED ORDER — FENTANYL CITRATE (PF) 100 MCG/2ML IJ SOLN
INTRAMUSCULAR | Status: AC
  Filled 2020-02-04: qty 2

## 2020-02-04 MED ORDER — MEPERIDINE HCL 25 MG/ML IJ SOLN: 6.2500 mg | INTRAMUSCULAR | Status: DC | PRN

## 2020-02-04 MED ORDER — DEXTROSE 5 % IV SOLN
1.0000 ug/kg/h | INTRAVENOUS | Status: DC | PRN
  Filled 2020-02-04: qty 5

## 2020-02-04 MED ORDER — METHYLERGONOVINE MALEATE 0.2 MG/ML IJ SOLN
INTRAMUSCULAR | Status: AC
  Filled 2020-02-04: qty 1

## 2020-02-04 MED ORDER — IBUPROFEN 800 MG PO TABS
800.0000 mg | ORAL_TABLET | Freq: Four times a day (QID) | ORAL | Status: DC
  Administered 2020-02-05 – 2020-02-06 (×5): 800 mg via ORAL
  Filled 2020-02-04 (×5): qty 1

## 2020-02-04 MED ORDER — KETOROLAC TROMETHAMINE 30 MG/ML IJ SOLN
30.0000 mg | Freq: Four times a day (QID) | INTRAMUSCULAR | Status: AC | PRN
  Administered 2020-02-04: 30 mg via INTRAVENOUS

## 2020-02-04 MED ORDER — ACETAMINOPHEN 10 MG/ML IV SOLN
INTRAVENOUS | Status: DC | PRN
  Administered 2020-02-04: 1000 mg via INTRAVENOUS

## 2020-02-04 MED ORDER — SODIUM CHLORIDE 0.9% FLUSH: 3.0000 mL | INTRAVENOUS | Status: DC | PRN

## 2020-02-04 MED ORDER — OXYTOCIN-SODIUM CHLORIDE 30-0.9 UT/500ML-% IV SOLN
INTRAVENOUS | Status: AC
  Filled 2020-02-04: qty 500

## 2020-02-04 MED ORDER — HYDROMORPHONE HCL 1 MG/ML IJ SOLN: 0.2000 mg | INTRAMUSCULAR | Status: DC | PRN

## 2020-02-04 MED ORDER — DIPHENHYDRAMINE HCL 25 MG PO CAPS: 25.0000 mg | ORAL_CAPSULE | ORAL | Status: DC | PRN

## 2020-02-04 MED ORDER — LIDOCAINE-EPINEPHRINE (PF) 2 %-1:200000 IJ SOLN
INTRAMUSCULAR | Status: DC | PRN
  Administered 2020-02-04 (×3): 5 mL via EPIDURAL

## 2020-02-04 MED ORDER — DEXTROSE 5 % IV SOLN
3.0000 g | INTRAVENOUS | Status: AC
  Administered 2020-02-04: 3 g via INTRAVENOUS
  Filled 2020-02-04: qty 3000

## 2020-02-04 MED ORDER — SODIUM CHLORIDE 0.9 % IV SOLN
INTRAVENOUS | Status: AC
  Filled 2020-02-04: qty 500

## 2020-02-04 MED ORDER — TRANEXAMIC ACID-NACL 1000-0.7 MG/100ML-% IV SOLN
INTRAVENOUS | Status: DC | PRN
  Administered 2020-02-04: 1000 mg via INTRAVENOUS

## 2020-02-04 MED ORDER — OXYCODONE HCL 5 MG PO TABS: 5.0000 mg | ORAL_TABLET | Freq: Once | ORAL | Status: DC | PRN

## 2020-02-04 MED ORDER — FENTANYL CITRATE (PF) 100 MCG/2ML IJ SOLN
25.0000 ug | INTRAMUSCULAR | Status: DC | PRN
  Administered 2020-02-04 (×2): 50 ug via INTRAVENOUS

## 2020-02-04 MED ORDER — SOD CITRATE-CITRIC ACID 500-334 MG/5ML PO SOLN
30.0000 mL | ORAL | Status: AC
  Administered 2020-02-04: 30 mL via ORAL

## 2020-02-04 MED ORDER — ACETAMINOPHEN 500 MG PO TABS
1000.0000 mg | ORAL_TABLET | Freq: Four times a day (QID) | ORAL | Status: DC
  Administered 2020-02-04 – 2020-02-06 (×9): 1000 mg via ORAL
  Filled 2020-02-04 (×9): qty 2

## 2020-02-04 MED ORDER — BISACODYL 10 MG RE SUPP
10.0000 mg | Freq: Every day | RECTAL | Status: DC | PRN
  Administered 2020-02-05: 10 mg via RECTAL
  Filled 2020-02-04: qty 1

## 2020-02-04 MED ORDER — WITCH HAZEL-GLYCERIN EX PADS
1.0000 "application " | MEDICATED_PAD | CUTANEOUS | Status: DC | PRN
  Administered 2020-02-06: 1 via TOPICAL

## 2020-02-04 MED ORDER — SODIUM CHLORIDE 0.9 % IR SOLN
Status: DC | PRN
  Administered 2020-02-04: 1

## 2020-02-04 MED ORDER — PHENYLEPHRINE HCL (PRESSORS) 10 MG/ML IV SOLN
INTRAVENOUS | Status: DC | PRN
  Administered 2020-02-04: 80 ug via INTRAVENOUS

## 2020-02-04 MED ORDER — OXYCODONE HCL 5 MG PO TABS
5.0000 mg | ORAL_TABLET | ORAL | Status: DC | PRN
  Administered 2020-02-05: 5 mg via ORAL
  Administered 2020-02-05: 10 mg via ORAL
  Administered 2020-02-05: 5 mg via ORAL
  Administered 2020-02-06 (×3): 10 mg via ORAL
  Filled 2020-02-04 (×3): qty 2
  Filled 2020-02-04: qty 1
  Filled 2020-02-04: qty 2
  Filled 2020-02-04: qty 1

## 2020-02-04 MED ORDER — ACETAMINOPHEN 160 MG/5ML PO SOLN: 325.0000 mg | ORAL | Status: DC | PRN

## 2020-02-04 MED ORDER — LACTATED RINGERS IV SOLN: INTRAVENOUS | Status: DC | PRN

## 2020-02-04 MED ORDER — DIBUCAINE (PERIANAL) 1 % EX OINT: 1.0000 "application " | TOPICAL_OINTMENT | CUTANEOUS | Status: DC | PRN

## 2020-02-04 MED ORDER — NALOXONE HCL 0.4 MG/ML IJ SOLN: 0.4000 mg | INTRAMUSCULAR | Status: DC | PRN

## 2020-02-04 MED ORDER — SIMETHICONE 80 MG PO CHEW: 80.0000 mg | CHEWABLE_TABLET | ORAL | Status: DC | PRN

## 2020-02-04 MED ORDER — FENTANYL CITRATE (PF) 250 MCG/5ML IJ SOLN
INTRAMUSCULAR | Status: DC | PRN
  Administered 2020-02-04 (×3): 50 ug via INTRAVENOUS
  Administered 2020-02-04 (×4): 25 ug via INTRAVENOUS

## 2020-02-04 MED ORDER — ONDANSETRON HCL 4 MG/2ML IJ SOLN: 4.0000 mg | Freq: Three times a day (TID) | INTRAMUSCULAR | Status: DC | PRN

## 2020-02-04 MED ORDER — MORPHINE SULFATE (PF) 0.5 MG/ML IJ SOLN
INTRAMUSCULAR | Status: DC | PRN
  Administered 2020-02-04: 3 mg via EPIDURAL

## 2020-02-04 MED ORDER — ACETAMINOPHEN 325 MG PO TABS: 325.0000 mg | ORAL_TABLET | ORAL | Status: DC | PRN

## 2020-02-04 MED ORDER — OXYTOCIN-SODIUM CHLORIDE 30-0.9 UT/500ML-% IV SOLN
INTRAVENOUS | Status: DC | PRN
  Administered 2020-02-04: 100 mL via INTRAVENOUS
  Administered 2020-02-04: 300 mL via INTRAVENOUS

## 2020-02-04 MED ORDER — COCONUT OIL OIL: 1.0000 "application " | TOPICAL_OIL | Status: DC | PRN

## 2020-02-04 MED ORDER — NALBUPHINE HCL 10 MG/ML IJ SOLN: 5.0000 mg | INTRAMUSCULAR | Status: DC | PRN

## 2020-02-04 MED ORDER — ACETAMINOPHEN 10 MG/ML IV SOLN
INTRAVENOUS | Status: AC
  Filled 2020-02-04: qty 100

## 2020-02-04 MED ORDER — SODIUM CHLORIDE 0.9 % IV SOLN
500.0000 mg | Freq: Once | INTRAVENOUS | Status: AC
  Administered 2020-02-04: 500 mg via INTRAVENOUS

## 2020-02-04 MED ORDER — METHYLERGONOVINE MALEATE 0.2 MG/ML IJ SOLN
INTRAMUSCULAR | Status: DC | PRN
  Administered 2020-02-04: .2 mg via INTRAMUSCULAR

## 2020-02-04 MED ORDER — FENTANYL CITRATE (PF) 100 MCG/2ML IJ SOLN
INTRAMUSCULAR | Status: DC | PRN
  Administered 2020-02-04: 100 ug via EPIDURAL

## 2020-02-04 MED ORDER — LACTATED RINGERS IV SOLN: INTRAVENOUS | Status: DC

## 2020-02-04 MED ORDER — SIMETHICONE 80 MG PO CHEW
80.0000 mg | CHEWABLE_TABLET | Freq: Three times a day (TID) | ORAL | Status: DC
  Administered 2020-02-04 – 2020-02-06 (×8): 80 mg via ORAL
  Filled 2020-02-04 (×8): qty 1

## 2020-02-04 MED ORDER — SCOPOLAMINE 1 MG/3DAYS TD PT72: 1.0000 | MEDICATED_PATCH | Freq: Once | TRANSDERMAL | Status: DC

## 2020-02-04 MED ORDER — GABAPENTIN 100 MG PO CAPS
100.0000 mg | ORAL_CAPSULE | Freq: Three times a day (TID) | ORAL | Status: DC
  Administered 2020-02-04 – 2020-02-06 (×7): 100 mg via ORAL
  Filled 2020-02-04 (×7): qty 1

## 2020-02-04 MED ORDER — DEXAMETHASONE SODIUM PHOSPHATE 10 MG/ML IJ SOLN
INTRAMUSCULAR | Status: DC | PRN
  Administered 2020-02-04: 10 mg via INTRAVENOUS

## 2020-02-04 MED ORDER — MORPHINE SULFATE (PF) 0.5 MG/ML IJ SOLN
INTRAMUSCULAR | Status: AC
  Filled 2020-02-04: qty 10

## 2020-02-04 MED ORDER — ONDANSETRON HCL 4 MG/2ML IJ SOLN: 4.0000 mg | Freq: Once | INTRAMUSCULAR | Status: DC | PRN

## 2020-02-04 MED ORDER — LIDOCAINE HCL 1 % IJ SOLN
INTRAMUSCULAR | Status: DC | PRN
  Administered 2020-02-04: 30 mL

## 2020-02-04 MED ORDER — OXYCODONE HCL 5 MG/5ML PO SOLN: 5.0000 mg | Freq: Once | ORAL | Status: DC | PRN

## 2020-02-04 MED ORDER — LIDOCAINE HCL (PF) 1 % IJ SOLN
INTRAMUSCULAR | Status: AC
  Filled 2020-02-04: qty 30

## 2020-02-04 SURGICAL SUPPLY — 39 items
BENZOIN TINCTURE PRP APPL 2/3 (GAUZE/BANDAGES/DRESSINGS) ×2 IMPLANT
CHLORAPREP W/TINT 26ML (MISCELLANEOUS) ×2 IMPLANT
CLAMP CORD UMBIL (MISCELLANEOUS) IMPLANT
CLOTH BEACON ORANGE TIMEOUT ST (SAFETY) ×2 IMPLANT
DECANTER SPIKE VIAL GLASS SM (MISCELLANEOUS) ×2 IMPLANT
DRESSING PREVENA PLUS CUSTOM (GAUZE/BANDAGES/DRESSINGS) ×1 IMPLANT
DRSG OPSITE POSTOP 4X10 (GAUZE/BANDAGES/DRESSINGS) ×2 IMPLANT
DRSG PREVENA PLUS CUSTOM (GAUZE/BANDAGES/DRESSINGS) ×2
ELECT REM PT RETURN 9FT ADLT (ELECTROSURGICAL) ×2
ELECTRODE REM PT RTRN 9FT ADLT (ELECTROSURGICAL) ×1 IMPLANT
EXTRACTOR VACUUM KIWI (MISCELLANEOUS) IMPLANT
GLOVE BIO SURGEON STRL SZ 6 (GLOVE) ×2 IMPLANT
GLOVE BIOGEL PI IND STRL 6.5 (GLOVE) ×1 IMPLANT
GLOVE BIOGEL PI IND STRL 7.0 (GLOVE) ×1 IMPLANT
GLOVE BIOGEL PI INDICATOR 6.5 (GLOVE) ×1
GLOVE BIOGEL PI INDICATOR 7.0 (GLOVE) ×1
GOWN STRL REUS W/TWL LRG LVL3 (GOWN DISPOSABLE) ×4 IMPLANT
KIT ABG SYR 3ML LUER SLIP (SYRINGE) IMPLANT
NEEDLE HYPO 22GX1.5 SAFETY (NEEDLE) ×2 IMPLANT
NEEDLE HYPO 25X5/8 SAFETYGLIDE (NEEDLE) IMPLANT
NS IRRIG 1000ML POUR BTL (IV SOLUTION) ×2 IMPLANT
PACK C SECTION WH (CUSTOM PROCEDURE TRAY) ×2 IMPLANT
PAD OB MATERNITY 4.3X12.25 (PERSONAL CARE ITEMS) ×2 IMPLANT
PENCIL SMOKE EVAC W/HOLSTER (ELECTROSURGICAL) ×2 IMPLANT
RTRCTR C-SECT PINK 25CM LRG (MISCELLANEOUS) ×2 IMPLANT
SPONGE LAP 18X18 X RAY DECT (DISPOSABLE) ×2 IMPLANT
STRIP CLOSURE SKIN 1/2X4 (GAUZE/BANDAGES/DRESSINGS) ×2 IMPLANT
SUT MNCRL 0 VIOLET CTX 36 (SUTURE) ×3 IMPLANT
SUT MONOCRYL 0 CTX 36 (SUTURE) ×3
SUT PDS AB 0 CTX 60 (SUTURE) ×2 IMPLANT
SUT PLAIN 0 NONE (SUTURE) IMPLANT
SUT VIC AB 0 CTX 36 (SUTURE) ×2
SUT VIC AB 0 CTX36XBRD ANBCTRL (SUTURE) ×2 IMPLANT
SUT VIC AB 2-0 CT1 27 (SUTURE) ×2
SUT VIC AB 2-0 CT1 TAPERPNT 27 (SUTURE) ×2 IMPLANT
SUT VIC AB 4-0 KS 27 (SUTURE) ×2 IMPLANT
TOWEL OR 17X24 6PK STRL BLUE (TOWEL DISPOSABLE) ×2 IMPLANT
TRAY FOLEY W/BAG SLVR 14FR LF (SET/KITS/TRAYS/PACK) ×2 IMPLANT
WATER STERILE IRR 1000ML POUR (IV SOLUTION) ×4 IMPLANT

## 2020-02-04 NOTE — Progress Notes (Signed)
OBGYN Note Called to patient room due to increased pressure and late decels that have not resolved with positioning and fluid bolus SVE 5/70/-2 FHR 150, +accels, moderate variability, recurrent late decels Toco q1-7mins, 170-278mvus Miranda Mack 24 y.o. G2P0010 at [redacted]w[redacted]d here for IOL 2/2 term, variables on monitoring in the office -Fetal testing: has been overall reassuring but has had periods of late decels, previously resolved with position changes, fluid bolus and holding pitocin. Will hold pitocin again. Discussed if unable to resolve, proceed with CS due to fetal intolerance of labor -SVE remains unchanged, discussed c/w latent labor -Increased pain, Anesthesia re-dosed epidural, continue to monitor Reneta Niehaus K Taam-Akelman 02/04/20 12:42 AM

## 2020-02-04 NOTE — Op Note (Signed)
Procedure(s): CESAREAN SECTION Procedure Note  Miranda Mack female 25 y.o. 02/04/2020  Procedure(s) and Anesthesia Type:    * CESAREAN SECTION - Regional  Surgeon(s) and Role:    * Mack, Miranda Miner, MD - Primary  Indications: Miranda Mack 25 y.o. G2P0010 [redacted]w[redacted]d admitted 02/02/2020 from clinic for variable decels on monitoring. On admission was 1/50/-3, received cytotec x3, foley balloon and has been on pitocin x 17 hours and s/p AROM x 11 hours, she has had minimal change and remains 5/70/-2. She has been having recurrent late decels, initially improved with holding pitocin earlier and then returned. Pitocin held again, decels resolved, discussed plan of care with patient, resuming pitocin vs. Proceeding with CS. Patient desires to proceed with CS due to fetal intolerance of labor, remote from delivery. Patient consented. OR notified.   Preop diagnosis: obesity, pregnancy at [redacted]w[redacted]d, fetal intolerance of labor Postop diagnosis: obesity, pregnancy delivered, postpartum hemorrhage     Surgeon: Miranda Mack   Anesthesia: Epidural anesthesia   Procedure Detail  CESAREAN SECTION  Findings: Normal mullerian anatomy.  Viable female infant "Miranda Mack" with weight 3495g (7pounds 11.3ounces) Apgars 8 and 9. Venous cord gas ph 7.31  Estimated Blood Loss:  1089cc         Specimens: Placenta to pathology         Complications:  None         Disposition: PACU - hemodynamically stable.         Condition: stable    Description of Procedure: The patient was taken to the operating room where epidural anesthesia was found to be adequate.  The patient was placed in the dorsal supine position.  Fetal heart tones were confirmed.  The patient was subsequently prepped and draped in the normal sterile fashion.    A low transverse skin incision was made with a scalpel and carried down to the level of the fascia with the Bovie.  The fascia was incised in the midline with the scalpel  and extended laterally with curved Mayo scissors.  Kocher clamps were applied to the superior fascial edge and the fascia was dissected off the rectus muscle sharply using the scalpel.  The Kocher clamps were transferred to the inferior fascial edge and the underlying rectus muscle was dissected off with curved Mayo's scissors.  The rectus muscles then were separated in the midline.  The peritoneum was found free of adherent bowel and the peritoneal cavity was entered bluntly.  The uterus was identified and the alexis retractor was placed intraperitoneal.  A bladder flap was then created sharply with Metzenbaum scissors and separated from the lower uterine segment digitally.   A low transverse hysterotomy was then made with a scalpel.  The infant was found in the cephalic presentation was delivered atraumatically and without difficulty in the usual fashion. Loose nuchal cord easily reduced.  After 60 seconds of delayed cord clamping the cord was clamped and cut and the infant was handed off to the pediatricians.  Unable to draw arterial cord gas, venous cord gas sent. The placenta was delivered with gentle traction on umbilical cord and manual massage of the uterine fundus.  The uterus was cleared of all clot and debris.  The hysterotomy was then closed with 0 monocryl in a running locked fashion.  Followed by 0 Monocryl in an imbricating fashion. Multiple figure of eights with 0 monocryl with placed on the hysterotomy for hemostasis. The hysterotomy was found to be hemostatic.  Due to poor uterine tone, methergine  and hemabate were given by Anesthesia.  The fascia was closed with a 0 loop PDS suture in a continuous running fashion. The subcutaneous tissue was irrigated and rendered hemostatic with cautery.  The subcutaneous layer was subsequently closed with 2-0 Vicryl in a continuous running fashion in two layers.  The skin was closed with vicryl on a keith needle in a running subcuticular fashion.  10cc 1%  lidocaine injected in the incision for pain management. Sponge, lap and needle counts were correct. Prevena dressing was placed on the incision.   Miranda Mack K Mack 02/04/20 3:25 AM

## 2020-02-04 NOTE — Transfer of Care (Signed)
Immediate Anesthesia Transfer of Care Note  Patient: Miranda Mack  Procedure(s) Performed: CESAREAN SECTION (N/A )  Patient Location: PACU  Anesthesia Type:Epidural  Level of Consciousness: awake, alert  and oriented  Airway & Oxygen Therapy: Patient Spontanous Breathing  Post-op Assessment: Report given to RN and Post -op Vital signs reviewed and stable  Post vital signs: Reviewed and stable  Last Vitals:  Vitals Value Taken Time  BP 120/68 02/04/20 0345  Temp 36.9 C 02/04/20 0345  Pulse 88 02/04/20 0346  Resp 35 02/04/20 0346  SpO2 99 % 02/04/20 0346  Vitals shown include unvalidated device data.  Last Pain:  Vitals:   02/03/20 2230  TempSrc: Oral  PainSc:       Patients Stated Pain Goal: 0 (02/02/20 2059)  Complications: No complications documented.

## 2020-02-04 NOTE — Anesthesia Postprocedure Evaluation (Signed)
Anesthesia Post Note  Patient: Miranda Mack  Procedure(s) Performed: CESAREAN SECTION (N/A )     Patient location during evaluation: Mother Baby Anesthesia Type: Epidural Level of consciousness: awake and alert Pain management: pain level controlled Vital Signs Assessment: post-procedure vital signs reviewed and stable Respiratory status: spontaneous breathing, nonlabored ventilation and respiratory function stable Cardiovascular status: stable Postop Assessment: no headache, no backache and epidural receding Anesthetic complications: no   No complications documented.  Last Vitals:  Vitals:   02/04/20 0445 02/04/20 0500  BP: 92/61 102/64  Pulse: 69 63  Resp: 16 16  Temp:  37.2 C  SpO2:  95%    Last Pain:  Vitals:   02/04/20 0500  TempSrc: Oral  PainSc: 0-No pain   Pain Goal: Patients Stated Pain Goal: 0 (02/02/20 2059)                 Rhianon Zabawa

## 2020-02-04 NOTE — Progress Notes (Signed)
Code hemorrhage called.

## 2020-02-04 NOTE — Lactation Note (Signed)
This note was copied from a baby's chart. Lactation Consultation Note  Patient Name: Miranda Mack [id number] Date: 02/04/2020 Reason for consult: Initial assessment;Term;Primapara;1st time breastfeeding Age:27 hours   P1 mother whose infant is now 107 hours old.  This is a term baby at 40+1 weeks.    Mother was preparing to breast feed when I arrived.  Offered to assist with latching and mother agreeable.  Mother's breasts are large, soft and non tender and nipples are everted and intact.  Asked mother to hand express; unable to see colostrum drops at this time.  Assisted baby to latch easily on the right breast in the cross cradle hold.  Baby immediately began to suck with a wide gape and flanged lips.  Demonstrated breast compressions and gentle stimulation.  Observed her feeding for 13 minutes before she self released.    Reviewed breast feeding basics with parents.  Mother will continue to feed on cue or at least 8-12 times/24 hours.  Encouraged to ask for latch assistance as needed.  Father present and shown how to actively assist mother with feedings.  Mom made aware of O/P services, breastfeeding support groups, community resources, and our phone # for post-discharge questions.  Mother has a DEBP for home use.  RN updated.   Maternal Data Formula Feeding for Exclusion: No Has patient been taught Hand Expression?: Yes Does the patient have breastfeeding experience prior to this delivery?: No  Feeding Feeding Type: Breast Fed  LATCH Score Latch: Grasps breast easily, tongue down, lips flanged, rhythmical sucking.  Audible Swallowing: A few with stimulation  Type of Nipple: Everted at rest and after stimulation  Comfort (Breast/Nipple): Soft / non-tender  Hold (Positioning): Assistance needed to correctly position infant at breast and maintain latch.  LATCH Score: 8  Interventions Interventions: Breast feeding basics reviewed;Assisted with latch;Skin to skin;Breast  massage;Hand express;Breast compression;Adjust position;Position options;Support pillows  Lactation Tools Discussed/Used     Consult Status Consult Status: Follow-up Date: 02/05/20 Follow-up type: In-patient    Donique Hammonds R Tobenna Needs 02/04/2020, 9:18 AM

## 2020-02-04 NOTE — Progress Notes (Signed)
Subjective: Postpartum Day 0: Cesarean Delivery Miranda Mack reports incisional pain 5/10 at rest, increases with movement. Not yet out of bed. Foley in place. No chest pain or shortness of breath. Lochia appropriate.  Objective: Patient Vitals for the past 24 hrs:  BP Temp Temp src Pulse Resp SpO2  02/04/20 0600 (!) 115/56 98.6 F (37 C) Oral 65 16 97 %  02/04/20 0500 102/64 98.9 F (37.2 C) Oral 63 16 95 %  02/04/20 0445 92/61 -- -- 69 16 --  02/04/20 0430 117/65 98.5 F (36.9 C) Oral 63 15 96 %  02/04/20 0415 128/60 -- -- 69 17 97 %  02/04/20 0400 120/64 -- -- 85 15 97 %  02/04/20 0345 120/68 98.5 F (36.9 C) -- 82 16 95 %  02/04/20 0040 (!) 111/52 -- -- 63 -- --  02/04/20 0035 117/72 -- -- (!) 103 -- --  02/04/20 0030 (!) 116/47 -- -- 72 -- --  02/04/20 0000 113/61 -- -- 71 -- --  02/03/20 2330 126/61 -- -- 64 -- --  02/03/20 2300 123/60 -- -- (!) 57 18 --  02/03/20 2230 (!) 111/58 98.2 F (36.8 C) Oral 60 18 --  02/03/20 2130 91/64 -- -- 73 18 --  02/03/20 2100 (!) 88/47 -- -- 100 16 --  02/03/20 2030 (!) 109/57 -- -- 69 -- --  02/03/20 2000 (!) 98/40 -- -- (!) 59 -- --  02/03/20 1930 (!) 108/42 98 F (36.7 C) Oral 63 16 --  02/03/20 1831 (!) 106/52 -- -- 65 -- --  02/03/20 1801 (!) 88/32 -- -- 65 -- --  02/03/20 1731 (!) 104/34 98 F (36.7 C) -- 78 -- --  02/03/20 1701 (!) 102/53 -- -- 75 -- --  02/03/20 1631 (!) 111/50 -- -- 80 -- --  02/03/20 1601 122/64 -- -- 88 -- --  02/03/20 1531 112/70 -- -- (!) 104 -- --  02/03/20 1501 124/65 -- -- 70 -- --  02/03/20 1431 120/63 -- -- 70 -- --  02/03/20 1401 (!) 105/53 -- -- 82 -- --  02/03/20 1331 96/71 -- -- 98 -- --  02/03/20 1301 121/67 -- -- 82 -- --  02/03/20 1231 124/64 -- -- 72 -- --  02/03/20 1201 123/67 98 F (36.7 C) -- 83 -- --  02/03/20 1131 117/66 -- -- 80 -- --  02/03/20 1101 114/61 -- -- 83 -- --  02/03/20 1031 (!) 122/51 -- -- 79 -- --  02/03/20 1001 117/69 -- -- 89 -- --  02/03/20 0931 116/65 -- -- 86  -- --     Physical Exam:  General: alert, cooperative and no distress Lochia: appropriate Uterine Fundus: firm Incision: covered with Prevena dressing, no drainage in canister DVT Evaluation: No evidence of DVT seen on physical exam.  Recent Labs    02/02/20 1644  HGB 11.2*  HCT 35.3*    Assessment/Plan:  Miranda Mack G2P1011 POD#0 sp primary c-section at [redacted]w[redacted]d 1. PPC: continue routine postoperative care, will work on pain management today 2. EBL Intraop: am CBC pending, asymptomatic currently 3. Obesity, postop: plan for postop lovenox pending AM labs 4. Encourage ambulating, d/c foley when able to ambulate 5. Rh pos, Rubella immune, s/p Tdap/flu/covid vaccines in pregnancy. Offer booster. \ 6. Dispo: anticipate discharge POD2  [name] 02/04/2020, 9:25 AM

## 2020-02-05 NOTE — Progress Notes (Addendum)
Subjective: Postpartum Day 1: Cesarean Delivery Miranda Mack continues to report incisional pain with movement, left>right. Ambulating to bathroom but limited by pain. Voiding. Tolerating PO. Minimal lochia.   Objective: Patient Vitals for the past 24 hrs:  BP Temp Temp src Pulse Resp SpO2  02/05/20 0521 115/65 98.2 F (36.8 C) Oral 81 18 98 %  02/04/20 2028 (!) 109/56 98.1 F (36.7 C) Oral 75 16 100 %  02/04/20 1927 117/65 -- -- 79 16 99 %  02/04/20 1500 116/60 98 F (36.7 C) Oral 70 16 98 %  02/04/20 1300 -- -- -- -- -- 98 %  02/04/20 1200 -- -- -- -- -- 97 %  02/04/20 1052 -- 98.4 F (36.9 C) Oral -- -- 97 %     Physical Exam:  General: alert, cooperative and no distress Lochia: appropriate Uterine Fundus: firm Incision: covered by Prevena dressing DVT Evaluation: No evidence of DVT seen on physical exam.  Recent Labs    02/02/20 1644 02/04/20 0906  HGB 11.2* 9.7*  HCT 35.3* 30.6*    Assessment/Plan: Miranda Mack G2P1011 POD#1 sp primary c-section at [redacted]w[redacted]d 1. PPC: continue routine postoperative care, continue pain mgmt. Patient requested abdominal binder. 2. EBL Intraop: postop H/H 9.7/30.6 3. Obesity, postop: prophylactic lovenox while inpatient 4. Encourage ambulation 5. Rh pos, Rubella immune, s/p Tdap/flu/covid vaccines in pregnancy 6. Dispo: anticipate discharge POD2  Miranda Mack 02/05/2020, 8:38 AM

## 2020-02-06 LAB — CBC WITH DIFFERENTIAL/PLATELET
Abs Immature Granulocytes: 0.17 10*3/uL — ABNORMAL HIGH (ref 0.00–0.07)
Basophils Absolute: 0 10*3/uL (ref 0.0–0.1)
Basophils Relative: 0 %
Eosinophils Absolute: 0.4 10*3/uL (ref 0.0–0.5)
Eosinophils Relative: 3 %
HCT: 25.5 % — ABNORMAL LOW (ref 36.0–46.0)
Hemoglobin: 8.1 g/dL — ABNORMAL LOW (ref 12.0–15.0)
Immature Granulocytes: 1 %
Lymphocytes Relative: 21 %
Lymphs Abs: 3 10*3/uL (ref 0.7–4.0)
MCH: 28.9 pg (ref 26.0–34.0)
MCHC: 31.8 g/dL (ref 30.0–36.0)
MCV: 91.1 fL (ref 80.0–100.0)
Monocytes Absolute: 0.9 10*3/uL (ref 0.1–1.0)
Monocytes Relative: 6 %
Neutro Abs: 9.7 10*3/uL — ABNORMAL HIGH (ref 1.7–7.7)
Neutrophils Relative %: 69 %
Platelets: 345 10*3/uL (ref 150–400)
RBC: 2.8 MIL/uL — ABNORMAL LOW (ref 3.87–5.11)
RDW: 15.8 % — ABNORMAL HIGH (ref 11.5–15.5)
WBC: 14.1 10*3/uL — ABNORMAL HIGH (ref 4.0–10.5)
nRBC: 0 % (ref 0.0–0.2)

## 2020-02-06 MED ORDER — OXYCODONE HCL 5 MG PO TABS
5.0000 mg | ORAL_TABLET | ORAL | 0 refills | Status: DC | PRN
Start: 2020-02-06 — End: 2020-04-24

## 2020-02-06 MED ORDER — IBUPROFEN 600 MG PO TABS: 600.0000 mg | ORAL_TABLET | Freq: Four times a day (QID) | ORAL | 1 refills | Status: DC | PRN

## 2020-02-06 MED ORDER — ACETAMINOPHEN 325 MG PO TABS: 650.0000 mg | ORAL_TABLET | Freq: Four times a day (QID) | ORAL | 1 refills | Status: DC | PRN

## 2020-02-06 NOTE — Lactation Note (Signed)
This note was copied from a baby's chart. Lactation Consultation Note  Patient Name: Girl Aliegha Paullin VELFY'B Date: 02/06/2020 Reason for consult: Follow-up assessment;1st time breastfeeding;Primapara;Infant weight loss;Term Age:27 hours  Visited with mom of a 59 hours old FT female, she's a P1. Mom and baby are going home today. Reviewed discharge instructions, engorgement prevention/treatment and treatment/prevention of sore nipples. Mom told LC she's been feeding mainly formula here at the hospital because she felt baby was not getting enough, but that she's planning on feeding baby her own milk once it comes in, she'll either pump and bottle feed and/or also put baby to breast.  Baby is currently on Gerber Gentle formula, mom is also using a pacifier. She has a pump at home, [facility] reviewed some tips on pumping and latching, mom aware that her milk will be coming in regardless, she was encouraged to consistently empty the breast to keep up with her supply. Mom voiced baby has been cluster feeding at the hospital, even with the bottle, she kept showing feeding cues.  FOB present at the time of [facility] consultation. Parents reported all questions and concerns were answered, they're both aware of LC OP services and will contact PRN.  Maternal Data    Feeding Feeding Type: Formula Nipple Type: Nfant Slow Flow (purple)  LATCH Score                   Interventions Interventions: Breast feeding basics reviewed  Lactation Tools Discussed/Used     Consult Status Consult Status: Complete Date: 02/06/20 Follow-up type: Call as needed    Yamile Roedl Venetia Constable 02/06/2020, 2:42 PM

## 2020-02-06 NOTE — Progress Notes (Signed)
Subjective: Postpartum Day 2: Cesarean Delivery Miranda Mack continues to report incisional pain with movement, left>right. Ambulating, Voiding, +BM. Tolerating PO. Minimal lochia. Feeling overwhelmed and tired, asking about restarting Celexa.   Objective: Patient Vitals for the past 24 hrs:  BP Temp Temp src Pulse Resp SpO2  02/06/20 0536 (!) 127/58 98 F (36.7 C) Oral 88 17 99 %  02/05/20 2252 125/66 97.7 F (36.5 C) Oral 92 17 99 %  02/05/20 1449 120/69 97.9 F (36.6 C) Oral 87 18 100 %     Physical Exam:  General: alert, cooperative and no distress Lochia: appropriate Uterine Fundus: firm Incision: covered with Prevena DVT Evaluation: No evidence of DVT seen on physical exam.  Recent Labs    02/04/20 0906  HGB 9.7*  HCT 30.6*    Assessment/Plan: Miranda Mack [id number] POD#2sp primary c-section at30w1d 1. ERX:VQMGQQPY routine postoperative care, continue pain mgmt.  2. H/o depression: discussed okay to restart Celexa, states she has it at home. SW consult requested prior to discharge. 3.EBL Intraop: postop H/H 9.7/30.6 4. Obesity, postop: prophylactic lovenox while inpatient 5. Encourage ambulation 6. Rh pos, Rubella immune, s/p Tdap/flu/covid vaccines in pregnancy 7. Dispo: d/c home today with incision check and depression check later this week. Discharge instructions given.   Miranda Mack 02/06/2020, 9:36 AM

## 2020-02-06 NOTE — Discharge Instructions (Signed)
Cesarean Delivery, Care After This sheet gives you information about how to care for yourself after your procedure. Your health care provider may also give you more specific instructions. If you have problems or questions, contact your health care provider. What can I expect after the procedure? After the procedure, it is common to have:  A small amount of blood or clear fluid coming from the incision.  Some redness, swelling, and pain in your incision area.  Some abdominal pain and soreness.  Vaginal bleeding (lochia). Even though you did not have a vaginal delivery, you will still have vaginal bleeding and discharge.  Pelvic cramps.  Fatigue. You may have pain, swelling, and discomfort in the tissue between your vagina and your anus (perineum) if:  Your C-section was unplanned, and you were allowed to labor and push.  An incision was made in the area (episiotomy) or the tissue tore during attempted vaginal delivery. Follow these instructions at home: Incision care  Follow instructions from your health care provider about how to take care of your incision. Make sure you: ? Wash your hands with soap and water before you change your bandage (dressing). If soap and water are not available, use hand sanitizer. ? If you have a dressing, change it or remove it as told by your health care provider. ? Leave stitches (sutures), skin staples, skin glue, or adhesive strips in place. These skin closures may need to stay in place for 2 weeks or longer. If adhesive strip edges start to loosen and curl up, you may trim the loose edges. Do not remove adhesive strips completely unless your health care provider tells you to do that.  Check your incision area every day for signs of infection. Check for: ? More redness, swelling, or pain. ? More fluid or blood. ? Warmth. ? Pus or a bad smell.  Do not take baths, swim, or use a hot tub until your health care provider says it's okay. Ask your health  care provider if you can take showers.  When you cough or sneeze, hug a pillow. This helps with pain and decreases the chance of your incision opening up (dehiscing). Do this until your incision heals.   Medicines  Take over-the-counter and prescription medicines only as told by your health care provider.  If you were prescribed an antibiotic medicine, take it as told by your health care provider. Do not stop taking the antibiotic even if you start to feel better.  Do not drive or use heavy machinery while taking prescription pain medicine. Lifestyle  Do not drink alcohol. This is especially important if you are breastfeeding or taking pain medicine.  Do not use any products that contain nicotine or tobacco, such as cigarettes, e-cigarettes, and chewing tobacco. If you need help quitting, ask your health care provider. Eating and drinking  Drink at least 8 eight-ounce glasses of water every day unless told not to by your health care provider. If you breastfeed, you may need to drink even more water.  Eat high-fiber foods every day. These foods may help prevent or relieve constipation. High-fiber foods include: ? Whole grain cereals and breads. ? Brown rice. ? Beans. ? Fresh fruits and vegetables. Activity  If possible, have someone help you care for your baby and help with household activities for at least a few days after you leave the hospital.  Return to your normal activities as told by your health care provider. Ask your health care provider what activities are safe for   you.  Rest as much as possible. Try to rest or take a nap while your baby is sleeping.  Do not lift anything that is heavier than 10 lbs (4.5 kg), or the limit that you were told, until your health care provider says that it is safe.  Talk with your health care provider about when you can engage in sexual activity. This may depend on your: ? Risk of infection. ? How fast you heal. ? Comfort and desire to  engage in sexual activity.   General instructions  Do not use tampons or douches until your health care provider approves.  Wear loose, comfortable clothing and a supportive and well-fitting bra.  Keep your perineum clean and dry. Wipe from front to back when you use the toilet.  If you pass a blood clot, save it and call your health care provider to discuss. Do not flush blood clots down the toilet before you get instructions from your health care provider.  Keep all follow-up visits for you and your baby as told by your health care provider. This is important. Contact a health care provider if:  You have: ? A fever. ? Bad-smelling vaginal discharge. ? Pus or a bad smell coming from your incision. ? Difficulty or pain when urinating. ? A sudden increase or decrease in the frequency of your bowel movements. ? More redness, swelling, or pain around your incision. ? More fluid or blood coming from your incision. ? A rash. ? Nausea. ? Little or no interest in activities you used to enjoy. ? Questions about caring for yourself or your baby.  Your incision feels warm to the touch.  Your breasts turn red or become painful or hard.  You feel unusually sad or worried.  You vomit.  You pass a blood clot from your vagina.  You urinate more than usual.  You are dizzy or light-headed. Get help right away if:  You have: ? Pain that does not go away or get better with medicine. ? Chest pain. ? Difficulty breathing. ? Blurred vision or spots in your vision. ? Thoughts about hurting yourself or your baby. ? New pain in your abdomen or in one of your legs. ? A severe headache.  You faint.  You bleed from your vagina so much that you fill more than one sanitary pad in one hour. Bleeding should not be heavier than your heaviest period. Summary  After the procedure, it is common to have pain at your incision site, abdominal cramping, and slight bleeding from your vagina.  Check  your incision area every day for signs of infection.  Tell your health care provider about any unusual symptoms.  Keep all follow-up visits for you and your baby as told by your health care provider. This information is not intended to replace advice given to you by your health care provider. Make sure you discuss any questions you have with your health care provider. Document Revised: 07/09/2017 Document Reviewed: 07/09/2017 Elsevier Patient Education  2021 Elsevier Inc.  

## 2020-02-06 NOTE — Progress Notes (Signed)
CSW received consult for hx of depression and Edinburgh score of 11.  CSW met with MOB to offer support and complete assessment.    When CSW arrived, MOB was resting in bed and FOB was holding infant in the recliner. The family appeared happy and comfortable. CSW explained CSW's role and MOB gave CSW permission to complete the assessment while FOB was present.   CSW asked about MOB's MH hx and MOB reported being dx with depression in 2020 and was prescribed Celexa. Per MOB, she did not take the Celexa and has been managing her symptoms by talking to her supporters; specifically FOB's mother whom is in the mental health field.   CSW provided education regarding the baby blues period vs. perinatal mood disorders, discussed treatment and gave resources for mental health follow up if concerns arise.  CSW recommends self-evaluation during the postpartum time period using the New Mom Checklist from Postpartum Progress and encouraged MOB to contact a medical professional if symptoms are noted at any time.   CSW also reviewed MOB's Edinburgh score and recognized how transitioning to a new mom has been challenging.  MOB communicated having a good supports team and feels comfortable seeking help if help is needed.  CSW assessed for safety and MOB denied SI and HI. MOB also reports having all essential items to care for infant and feels prepared for their discharge.   CSW identifies no further need for intervention and no barriers to discharge at this time  Laurey Arrow, MSW, [facility] [phone]

## 2020-02-06 NOTE — Discharge Summary (Signed)
Postpartum Discharge Summary      Patient Name: Miranda Mack DOB: 02/08/1995 MRN: [id number]  Date of admission: 02/02/2020 Delivery date:02/04/2020  Delivering provider: Lyda Kalata K  Date of discharge: 02/06/2020  Admitting diagnosis: [redacted] weeks gestation of pregnancy [Z3A.39] Intrauterine pregnancy: [redacted]w[redacted]d    Secondary diagnosis:  Active Problems:   [redacted] weeks gestation of pregnancy  Additional problems: obesity    Discharge diagnosis: Term Pregnancy Delivered                                              Post partum procedures:non Augmentation: AROM, Pitocin, Cytotec and IP Foley Complications: None  Hospital course: Induction of Labor With Cesarean Section   25y.o. yo G2P1011 at 444w1das admitted to the hospital 02/02/2020 for induction of labor for "variable decels on monitoring. On admission was 1/50/-3, received cytotec x3, foley balloon and has been on pitocin x 17 hours and s/p AROM x 11 hours, she has had minimal change and remains 5/70/-2. She has been having recurrent late decels, initially improved with holding pitocin earlier and then returned. Pitocin held again, decels resolved, discussed plan of care with patient, resuming pitocin vs. Proceeding with CS. Patient desires to proceed with CS due to fetal intolerance of labor, remote from delivery."  T Delivery details are as follows: Membrane Rupture Time/Date: 2:27 PM ,02/03/2020   Delivery Method:C-Section, Low Transverse  Details of operation can be found in separate operative Note.  Patient had an uncomplicated postpartum course. She is ambulating, tolerating a regular diet, passing flatus, and urinating well.  Patient is discharged home in stable condition on 02/06/20.      Newborn Data: Birth date:02/04/2020  Birth time:2:24 AM  Gender:Female  Living status:Living  Apgars:8 ,9  Weight:3495 g                                 Magnesium Sulfate received: No BMZ received:  No Rhophylac:N/A MMR:N/A T-DaP:Given prenatally Flu: No Transfusion:No  Physical exam  Vitals:   02/05/20 0521 02/05/20 1449 02/05/20 2252 02/06/20 0536  BP: 115/65 120/69 125/66 (!) 127/58  Pulse: 81 87 92 88  Resp: 18 18 17 17   Temp: 98.2 F (36.8 C) 97.9 F (36.6 C) 97.7 F (36.5 C) 98 F (36.7 C)  TempSrc: Oral Oral Oral Oral  SpO2: 98% 100% 99% 99%  Weight:      Height:       General: alert, cooperative and no distress Lochia: appropriate Uterine Fundus: firm Incision: Healing well with no significant drainage DVT Evaluation: No evidence of DVT seen on physical exam. Labs: Lab Results  Component Value Date   WBC 27.2 (H) 02/04/2020   HGB 9.7 (L) 02/04/2020   HCT 30.6 (L) 02/04/2020   MCV 90.0 02/04/2020   PLT 290 02/04/2020   CMP Latest Ref Rng & Units 02/04/2020  Glucose 70 - 99 mg/dL 131(H)  BUN 6 - 20 mg/dL 7  Creatinine 0.44 - 1.00 mg/dL 0.90  Sodium 135 - 145 mmol/L 135  Potassium 3.5 - 5.1 mmol/L 4.5  Chloride 98 - 111 mmol/L 104  CO2 22 - 32 mmol/L 22  Calcium 8.9 - 10.3 mg/dL 9.0  Total Protein 6.5 - 8.1 g/dL -  Total Bilirubin 0.3 - 1.2 mg/dL -  Alkaline Phos  38 - 126 U/L -  AST 15 - 41 U/L -  ALT 0 - 44 U/L -   Edinburgh Score: Edinburgh Postnatal Depression Scale Screening Tool 02/06/2020  I have been able to laugh and see the funny side of things. 0  I have looked forward with enjoyment to things. 1  I have blamed myself unnecessarily when things went wrong. 2  I have been anxious or worried for no good reason. 2  I have felt scared or panicky for no good reason. 2  Things have been getting on top of me. 1  I have been so unhappy that I have had difficulty sleeping. 1  I have felt sad or miserable. 1  I have been so unhappy that I have been crying. 1  The thought of harming myself has occurred to me. 0  Edinburgh Postnatal Depression Scale Total 11      After visit meds:  Allergies as of 02/06/2020      Reactions   Fish Allergy  Anaphylaxis   Peanut-containing Drug Products Anaphylaxis      Medication List    STOP taking these medications   aspirin EC 81 MG tablet   cyclobenzaprine 10 MG tablet Commonly known as: FLEXERIL   diphenhydrAMINE 25 mg capsule Commonly known as: BENADRYL   multivitamin-prenatal 27-0.8 MG Tabs tablet   ondansetron 4 MG tablet Commonly known as: ZOFRAN   pantoprazole 40 MG tablet Commonly known as: PROTONIX     TAKE these medications   acetaminophen 325 MG tablet Commonly known as: TYLENOL Take 2 tablets (650 mg total) by mouth every 6 (six) hours as needed. What changed:   medication strength  how much to take  reasons to take this   ibuprofen 600 MG tablet Commonly known as: ADVIL Take 1 tablet (600 mg total) by mouth every 6 (six) hours as needed.   oxyCODONE 5 MG immediate release tablet Commonly known as: Oxy IR/ROXICODONE Take 1 tablet (5 mg total) by mouth every 4 (four) hours as needed for moderate pain.        Discharge home in stable condition Infant Feeding: Breast Infant Disposition:home with mother Discharge instruction: per After Visit Summary and Postpartum booklet. Activity: Advance as tolerated. Pelvic rest for 6 weeks.  Diet: routine diet Anticipated Birth Control: Unsure Postpartum Appointment:4 weeks Additional Postpartum F/U: Postpartum Depression checkup and Incision check 5 days Future Appointments:No future appointments. Follow up Visit:  Follow-up Information    Rowland Lathe, MD Follow up in 5 day(s).   Specialty: Obstetrics and Gynecology Why: To check incision Contact information: [address] [place] [address] [phone]                   02/06/2020 Rowland Lathe, MD

## 2020-02-07 LAB — SURGICAL PATHOLOGY

## 2020-04-24 ENCOUNTER — Emergency Department (HOSPITAL_BASED_OUTPATIENT_CLINIC_OR_DEPARTMENT_OTHER)
Admission: EM | Admit: 2020-04-24 | Discharge: 2020-04-24 | Disposition: A | Discharge status: Home / Self Care | Payer: Medicaid Other | Attending: Emergency Medicine | Admitting: Emergency Medicine

## 2020-04-24 ENCOUNTER — Encounter (HOSPITAL_BASED_OUTPATIENT_CLINIC_OR_DEPARTMENT_OTHER): Payer: Self-pay | Admitting: Emergency Medicine

## 2020-04-24 ENCOUNTER — Other Ambulatory Visit: Payer: Self-pay

## 2020-04-24 DIAGNOSIS — Z91010 Allergy to peanuts: Secondary | ICD-10-CM | POA: Diagnosis not present

## 2020-04-24 DIAGNOSIS — K602 Anal fissure, unspecified: Secondary | ICD-10-CM | POA: Diagnosis not present

## 2020-04-24 DIAGNOSIS — K625 Hemorrhage of anus and rectum: Secondary | ICD-10-CM | POA: Diagnosis present

## 2020-04-24 MED ORDER — FINGER COTS MEDIUM 20MM MISC: 0 refills | Status: DC

## 2020-04-24 MED ORDER — LIDOCAINE 5 % EX CREA: TOPICAL_CREAM | CUTANEOUS | 0 refills | Status: DC

## 2020-04-24 NOTE — ED Triage Notes (Signed)
BRBPR for the last couple of days, states at first only when wiping but tonight noted clots in her stool.

## 2020-04-24 NOTE — ED Provider Notes (Signed)
[facility] Provider Note: Lowella Dell, MD, FACEP  CSN: [id number] MRN: [id number] ARRIVAL: 04/24/20 at 0135 ROOM: MH07/[facility]07   CHIEF COMPLAINT  Rectal Bleeding   HISTORY OF PRESENT ILLNESS  04/24/20 1:46 AM Miranda Mack is a 25 y.o. female with about a 1 week history of rectal bleeding.  It began with blood on her toilet tissue but has now worsened and she finds himself passing clots after bowel movements.  There is also pain in her anal region associated with bowel movements which is moderate in severity.   Past Medical History:  Diagnosis Date  . GERD (gastroesophageal reflux disease)   . Miscarriage within last 12 months   . Ovarian cyst     Past Surgical History:  Procedure Laterality Date  . APPENDECTOMY    . CESAREAN SECTION N/A 02/04/2020   Procedure: CESAREAN SECTION;  Surgeon: Rande Brunt, MD;  Location: [facility] ORS;  Service: Obstetrics;  Laterality: N/A;  . LAPAROSCOPIC APPENDECTOMY N/A 04/02/2017   Procedure: APPENDECTOMY LAPAROSCOPIC;  Surgeon: Darnell Level, MD;  Location: [facility] ORS;  Service: General;  Laterality: N/A;    Family History  Problem Relation Age of Onset  . Diabetes Father   . Hypertension Father   . Diabetes Mother     Social History   Tobacco Use  . Smoking status: Never Smoker  . Smokeless tobacco: Never Used  Vaping Use  . Vaping Use: Never used  Substance Use Topics  . Alcohol use: Not Currently    Comment: occ  . Drug use: No    Prior to Admission medications   Medication Sig Start Date End Date Taking? Authorizing Provider  Elastic Bandages & Supports (FINGER COTS MEDIUM ) MISC Use as needed to apply medication to anal region. 04/24/20  Yes Urbano Milhouse, MD  Lidocaine 5 % CREA Apply to anal region as needed for pain. 04/24/20  Yes Quaid Yeakle, MD  misoprostol (CYTOTEC) 200 MCG tablet Place four tablets in between your gums and cheeks (two tablets on each side) as instructed Patient not taking:  Reported on 09/25/2018 09/17/18 07/01/19  Levie Heritage, DO    Allergies Fish allergy and Peanut-containing drug products   REVIEW OF SYSTEMS  Negative except as noted here or in the History of Present Illness.   PHYSICAL EXAMINATION  Initial Vital Signs Blood pressure 130/73, pulse 84, temperature 98.6 F (37 C), resp. rate 18, SpO2 95 %, unknown if currently breastfeeding.  Examination General: Well-developed, well-nourished female in no acute distress; appearance consistent with age of record HENT: normocephalic; atraumatic Eyes: Normal appearance Neck: supple Heart: regular rate and rhythm Lungs: clear to auscultation bilaterally Abdomen: soft; nondistended; nontender; bowel sounds present Rectal: Anterior midline fissure with slight bleeding Extremities: No deformity; full range of motion; pulses normal Neurologic: Awake, alert and oriented; motor function intact in all extremities and symmetric; no facial droop Skin: Warm and dry Psychiatric: Normal mood and affect   RESULTS  Summary of this visit's results, reviewed and interpreted by myself:   EKG Interpretation  Date/Time:    Ventricular Rate:    PR Interval:    QRS Duration:   QT Interval:    QTC Calculation:   R Axis:     Text Interpretation:        Laboratory Studies: No results found for this or any previous visit (from the past 24 hour(s)). Imaging Studies: No results found.  ED COURSE and MDM  Nursing notes, initial and subsequent vitals signs, including  pulse oximetry, reviewed and interpreted by myself.  Vitals:   04/24/20 0144  BP: 130/73  Pulse: 84  Resp: 18  Temp: 98.6 F (37 C)  SpO2: 95%   Medications - No data to display  Patient advised of diagnosis of anterior midline fissure.  She was advised to increase her stool bulk using Metamucil/psyllium.  We will also prescribe 5% lidocaine cream as needed.  We will refer to [facility] if symptoms persist.  PROCEDURES  Procedures   ED  DIAGNOSES     ICD-10-CM   1. Anal fissure  K60.2        Sydny Schnitzler, Jonny Ruiz, MD 04/24/20 [phone]

## 2020-06-22 ENCOUNTER — Encounter (HOSPITAL_BASED_OUTPATIENT_CLINIC_OR_DEPARTMENT_OTHER): Payer: Self-pay | Admitting: Emergency Medicine

## 2020-06-22 ENCOUNTER — Other Ambulatory Visit: Payer: Self-pay

## 2020-06-22 ENCOUNTER — Emergency Department (HOSPITAL_BASED_OUTPATIENT_CLINIC_OR_DEPARTMENT_OTHER)
Admission: EM | Admit: 2020-06-22 | Discharge: 2020-06-22 | Disposition: A | Discharge status: Home / Self Care | Payer: Medicaid Other | Attending: Emergency Medicine | Admitting: Emergency Medicine

## 2020-06-22 DIAGNOSIS — R202 Paresthesia of skin: Secondary | ICD-10-CM | POA: Diagnosis not present

## 2020-06-22 DIAGNOSIS — T7804XA Anaphylactic reaction due to fruits and vegetables, initial encounter: Secondary | ICD-10-CM | POA: Insufficient documentation

## 2020-06-22 DIAGNOSIS — L299 Pruritus, unspecified: Secondary | ICD-10-CM | POA: Diagnosis not present

## 2020-06-22 DIAGNOSIS — T7840XA Allergy, unspecified, initial encounter: Secondary | ICD-10-CM | POA: Diagnosis present

## 2020-06-22 DIAGNOSIS — T781XXA Other adverse food reactions, not elsewhere classified, initial encounter: Secondary | ICD-10-CM

## 2020-06-22 MED ORDER — FAMOTIDINE 20 MG PO TABS
20.0000 mg | ORAL_TABLET | Freq: Once | ORAL | Status: AC
  Administered 2020-06-22: 20 mg via ORAL

## 2020-06-22 MED ORDER — DIPHENHYDRAMINE HCL 25 MG PO CAPS
50.0000 mg | ORAL_CAPSULE | Freq: Once | ORAL | Status: AC
  Administered 2020-06-22: 50 mg via ORAL

## 2020-06-22 MED ORDER — DEXAMETHASONE 6 MG PO TABS
10.0000 mg | ORAL_TABLET | Freq: Once | ORAL | Status: AC
  Administered 2020-06-22: 10 mg via ORAL

## 2020-06-22 NOTE — ED Notes (Signed)
Patient states eating nectarine with sensation of painful to swallow and "tightness" and "burning" in throat.

## 2020-06-22 NOTE — ED Provider Notes (Signed)
[facility] Provider Note: Lowella Dell, MD, FACEP  CSN: [id number] MRN: [id number] ARRIVAL: 06/22/20 at 0139 ROOM: [facility]01/[facility]01   CHIEF COMPLAINT  Allergic Reaction   HISTORY OF PRESENT ILLNESS  06/22/20 2:38 AM (patient seen at 2 AM during Epic downtime) Miranda Mack is a 25 y.o. female who ate a nectarine about 20 minutes prior to arrival.  Soon after eating the nectarine she experienced an odd sensation in her throat.  She describes it as a burning and itching sensation with a sensation of numbness lower in her throat.  Although she is having no difficulty swallowing, breathing or speaking she has a subjective sensation that these are difficult.  She denies any systemic symptoms such as nausea, vomiting, diarrhea, shortness of breath, hives or itching.  She has not taken any thing for her symptoms.  Her symptoms are mild currently.  She has no history of similar reaction to fruit.   History reviewed. No pertinent past medical history.  History reviewed. No pertinent surgical history.  History reviewed. No pertinent family history.  Social History   Tobacco Use   Smoking status: Unknown  Vaping Use   Vaping Use: Every day  Substance Use Topics   Alcohol use: Not Currently   Drug use: Never    Prior to Admission medications   Not on File    Allergies Patient has no known allergies.   REVIEW OF SYSTEMS  Negative except as noted here or in the History of Present Illness.   PHYSICAL EXAMINATION  Initial Vital Signs Blood pressure 121/62, pulse 86, temperature 98 F (36.7 C), temperature source Oral, resp. rate 16, weight 100.7 kg, SpO2 100 %.  Examination General: Well-developed, well-nourished female in no acute distress; appearance consistent with age of record HENT: normocephalic; atraumatic; no pharyngeal erythema or edema; no edema of lips or tongue; normal voice; no stridor Eyes: pupils equal, round and reactive to light; extraocular muscles  intact Neck: supple Heart: regular rate and rhythm Lungs: clear to auscultation bilaterally Abdomen: soft; nondistended; nontender; bowel sounds present Extremities: No deformity; full range of motion; pulses normal Neurologic: Awake, alert and oriented; motor function intact in all extremities and symmetric; no facial droop Skin: Warm and dry; no rash Psychiatric: Normal mood and affect   RESULTS  Summary of this visit's results, reviewed and interpreted by myself:   EKG Interpretation  Date/Time:    Ventricular Rate:    PR Interval:    QRS Duration:   QT Interval:    QTC Calculation:   R Axis:     Text Interpretation:          Laboratory Studies: No results found for this or any previous visit (from the past 24 hour(s)). Imaging Studies: No results found.  ED COURSE and MDM  Nursing notes, initial and subsequent vitals signs, including pulse oximetry, reviewed and interpreted by myself.  Vitals:   06/22/20 0150  BP: 121/62  Pulse: 86  Resp: 16  Temp: 98 F (36.7 C)  TempSrc: Oral  SpO2: 100%  Weight: 100.7 kg   Medications  diphenhydrAMINE (BENADRYL) capsule 50 mg (50 mg Oral Given During Downtime 06/22/20 0210)  dexamethasone (DECADRON) tablet 10 mg (10 mg Oral Given During Downtime 06/22/20 0210)  famotidine (PEPCID) tablet 20 mg (20 mg Oral Given During Downtime 06/22/20 0210)   2:42 AM No change in patient's symptoms.  She still has a patent airway with no difficulty breathing, speaking or swallowing.  I think she likely had a  topical reaction to an irritant in the nectarine given lack of any systemic symptoms.  She is clearly not worsened and I believe it is safe to discharge her home at this time.  She was advised to avoid nectarines and similar fruits such as apricots and peaches in the future.   PROCEDURES  Procedures   ED DIAGNOSES     ICD-10-CM   1. Allergic reaction to food, initial encounter  T78.1XXA          Daivik Overley, Jonny Ruiz, MD 06/22/20  [phone]

## 2020-08-23 ENCOUNTER — Other Ambulatory Visit: Payer: Self-pay

## 2020-08-23 ENCOUNTER — Emergency Department (HOSPITAL_BASED_OUTPATIENT_CLINIC_OR_DEPARTMENT_OTHER)
Admission: EM | Admit: 2020-08-23 | Discharge: 2020-08-23 | Disposition: A | Discharge status: Home / Self Care | Payer: Medicaid Other | Attending: Emergency Medicine | Admitting: Emergency Medicine

## 2020-08-23 ENCOUNTER — Emergency Department (HOSPITAL_BASED_OUTPATIENT_CLINIC_OR_DEPARTMENT_OTHER): Payer: Medicaid Other

## 2020-08-23 DIAGNOSIS — Z91010 Allergy to peanuts: Secondary | ICD-10-CM | POA: Insufficient documentation

## 2020-08-23 DIAGNOSIS — K219 Gastro-esophageal reflux disease without esophagitis: Secondary | ICD-10-CM | POA: Diagnosis not present

## 2020-08-23 DIAGNOSIS — R109 Unspecified abdominal pain: Secondary | ICD-10-CM | POA: Diagnosis present

## 2020-08-23 DIAGNOSIS — R1031 Right lower quadrant pain: Secondary | ICD-10-CM

## 2020-08-23 LAB — URINALYSIS, ROUTINE W REFLEX MICROSCOPIC
Bilirubin Urine: NEGATIVE
Glucose, UA: NEGATIVE mg/dL
Hgb urine dipstick: NEGATIVE
Ketones, ur: NEGATIVE mg/dL
Leukocytes,Ua: NEGATIVE
Nitrite: NEGATIVE
Protein, ur: NEGATIVE mg/dL
Specific Gravity, Urine: 1.02 (ref 1.005–1.030)
pH: 6.5 (ref 5.0–8.0)

## 2020-08-23 LAB — CBC WITH DIFFERENTIAL/PLATELET
Abs Immature Granulocytes: 0.03 10*3/uL (ref 0.00–0.07)
Basophils Absolute: 0 10*3/uL (ref 0.0–0.1)
Basophils Relative: 0 %
Eosinophils Absolute: 0.3 10*3/uL (ref 0.0–0.5)
Eosinophils Relative: 3 %
HCT: 31.7 % — ABNORMAL LOW (ref 36.0–46.0)
Hemoglobin: 9.9 g/dL — ABNORMAL LOW (ref 12.0–15.0)
Immature Granulocytes: 0 %
Lymphocytes Relative: 31 %
Lymphs Abs: 3.8 10*3/uL (ref 0.7–4.0)
MCH: 24.3 pg — ABNORMAL LOW (ref 26.0–34.0)
MCHC: 31.2 g/dL (ref 30.0–36.0)
MCV: 77.7 fL — ABNORMAL LOW (ref 80.0–100.0)
Monocytes Absolute: 0.7 10*3/uL (ref 0.1–1.0)
Monocytes Relative: 6 %
Neutro Abs: 7.2 10*3/uL (ref 1.7–7.7)
Neutrophils Relative %: 60 %
Platelets: 382 10*3/uL (ref 150–400)
RBC: 4.08 MIL/uL (ref 3.87–5.11)
RDW: 18.4 % — ABNORMAL HIGH (ref 11.5–15.5)
WBC: 12.1 10*3/uL — ABNORMAL HIGH (ref 4.0–10.5)
nRBC: 0 % (ref 0.0–0.2)

## 2020-08-23 LAB — COMPREHENSIVE METABOLIC PANEL
ALT: 11 U/L (ref 0–44)
AST: 12 U/L — ABNORMAL LOW (ref 15–41)
Albumin: 3.8 g/dL (ref 3.5–5.0)
Alkaline Phosphatase: 75 U/L (ref 38–126)
Anion gap: 7 (ref 5–15)
BUN: 13 mg/dL (ref 6–20)
CO2: 27 mmol/L (ref 22–32)
Calcium: 9.1 mg/dL (ref 8.9–10.3)
Chloride: 104 mmol/L (ref 98–111)
Creatinine, Ser: 0.76 mg/dL (ref 0.44–1.00)
GFR, Estimated: >60 mL/min (ref >60)
Glucose, Bld: 108 mg/dL — ABNORMAL HIGH (ref 70–99)
Potassium: 3.7 mmol/L (ref 3.5–5.1)
Sodium: 138 mmol/L (ref 135–145)
Total Bilirubin: 0.3 mg/dL (ref 0.3–1.2)
Total Protein: 7.2 g/dL (ref 6.5–8.1)

## 2020-08-23 LAB — LIPASE, BLOOD: Lipase: 25 U/L (ref 11–51)

## 2020-08-23 LAB — WET PREP, GENITAL
Clue Cells Wet Prep HPF POC: NONE SEEN
Sperm: NONE SEEN
Trich, Wet Prep: NONE SEEN
Yeast Wet Prep HPF POC: NONE SEEN

## 2020-08-23 LAB — PREGNANCY, URINE: Preg Test, Ur: NEGATIVE

## 2020-08-23 MED ORDER — MORPHINE SULFATE (PF) 4 MG/ML IV SOLN
4.0000 mg | Freq: Once | INTRAVENOUS | Status: AC
  Administered 2020-08-23: 4 mg via INTRAVENOUS
  Filled 2020-08-23: qty 1

## 2020-08-23 MED ORDER — LACTATED RINGERS IV BOLUS
1000.0000 mL | Freq: Once | INTRAVENOUS | Status: AC
  Administered 2020-08-23: 1000 mL via INTRAVENOUS

## 2020-08-23 MED ORDER — IOHEXOL 300 MG/ML  SOLN
100.0000 mL | Freq: Once | INTRAMUSCULAR | Status: AC | PRN
  Administered 2020-08-23: 100 mL via INTRAVENOUS

## 2020-08-23 MED ORDER — IBUPROFEN 400 MG PO TABS
600.0000 mg | ORAL_TABLET | Freq: Once | ORAL | Status: AC
  Administered 2020-08-23: 600 mg via ORAL
  Filled 2020-08-23: qty 1

## 2020-08-23 MED ORDER — KETOROLAC TROMETHAMINE 30 MG/ML IJ SOLN
30.0000 mg | Freq: Once | INTRAMUSCULAR | Status: AC
  Administered 2020-08-23: 30 mg via INTRAVENOUS
  Filled 2020-08-23: qty 1

## 2020-08-23 MED ORDER — LIDOCAINE 5 % EX PTCH
1.0000 | MEDICATED_PATCH | CUTANEOUS | Status: DC
  Administered 2020-08-23: 1 via TRANSDERMAL
  Filled 2020-08-23: qty 1

## 2020-08-23 NOTE — ED Provider Notes (Signed)
Care of patient assumed from Dr. Dalene Seltzer at 7 AM.  Patient presented to the ED due to acute onset right lower quadrant abdominal pain.  Initial plan was for labs and CT scan.  If inconclusive, consider transvaginal ultrasound and pelvic exam. Physical Exam  BP 125/63   Pulse 78   Temp 98.1 F (36.7 C) (Oral)   Resp 16   Ht 5\' 3"  (1.6 m)   Wt 127 kg   LMP 08/16/2020 (Exact Date)   SpO2 96%   BMI 49.60 kg/m   Physical Exam Vitals and nursing note reviewed. Exam conducted with a chaperone present.  Constitutional:      General: She is not in acute distress.    Appearance: She is well-developed. She is obese. She is not ill-appearing, toxic-appearing or diaphoretic.  HENT:     Head: Normocephalic and atraumatic.  Eyes:     Conjunctiva/sclera: Conjunctivae normal.  Cardiovascular:     Rate and Rhythm: Normal rate and regular rhythm.     Heart sounds: No murmur heard. Pulmonary:     Effort: Pulmonary effort is normal. No respiratory distress.     Breath sounds: Normal breath sounds.  Abdominal:     Palpations: Abdomen is soft.     Tenderness: There is abdominal tenderness (Right lower quadrant).  Genitourinary:    General: Normal vulva.     Exam position: Lithotomy position.     Vagina: Normal. No foreign body. No erythema or bleeding.     Cervix: Normal.     Uterus: Normal.      Adnexa:        Right: No tenderness or fullness.         Left: No tenderness or fullness.       Comments: Normal physiologic discharge Musculoskeletal:     Cervical back: Neck supple.  Skin:    General: Skin is warm and dry.  Neurological:     Mental Status: She is alert.    ED Course/Procedures     Procedures  MDM  Lab results notable for mild leukocytosis.  Results of CT scan showed no acute findings. Patient was agreeable to transvaginal ultrasound to assess for possible torsion and/or adnexal etiology not identified on CT scan.  Results of ultrasound did not show any acute  abnormality. Patient requested further analgesia.  Thus far she has gotten Toradol.  Dose of morphine was given.  On reassessment, patient stated that morphine had simply made her sleepy.  She still endorsed pain in the right lower quadrant area.  She states that she has had pain in this area before.  Approximately 6 months ago, she underwent a C-section.  Following the surgery, she had poor wound healing on the right aspect of her incision.  She temporarily required a wound VAC.  At the time, she has sharp pain in the scar and was told that she had some nerve damage.  Patient states that her current pain is similar to the pain she experienced back then.  Pelvic exam showed a normal amount of physiologic discharge.  She did not have significant adnexal tenderness.  The tenderness she experiences is more on the surface of her abdomen.  Given no concerning findings on pelvic exam, I do not feel that she would benefit from empiric antibiotics.  Patient was given dose of Motrin and a lidocaine patch on her area of pain.  She does state that she has a follow-up appointment with her OB doctor later this week.  Patient was  encouraged to keep that follow-up appointment and to return to the ED for any worsening symptoms.  She was discharged in good condition.       Gloris Manchester, MD 08/23/20 1754

## 2020-08-23 NOTE — ED Triage Notes (Signed)
Pt c/o lower abd pain that started at 0200. Denies n/v/d and discharge.

## 2020-08-23 NOTE — ED Provider Notes (Signed)
[facility] EMERGENCY DEPARTMENT Provider Note   CSN: [id number] Arrival date & time: 08/23/20  9390     History No chief complaint on file.   Miranda Mack is a 25 y.o. female.  HPI     25yo female with history of GERD, C-section February 04, 2020, appendectomy, presents with concern for right-sided abdominal pain.  Reports that she had had some abdominal pain around her C-section scar immediately after the C-section, was told that she had some nerve damage or pain.  Reports that this pain had improved, however last night at 2 AM she suddenly developed a stabbing pain on the right side of her C-section scar.  Reports the pain is more on the scar than it is elsewhere in her abdomen.  It is severe, burning, stabbing.  Denies nausea, vomiting, diarrhea, constipation, vaginal bleeding, vaginal discharge, dysuria, fevers.  The pain is worse when she moves.  It is severe.  Past Medical History:  Diagnosis Date   GERD (gastroesophageal reflux disease)    Miscarriage within last 12 months    Ovarian cyst     Patient Active Problem List   Diagnosis Date Noted   [redacted] weeks gestation of pregnancy 02/02/2020   Appendicitis 04/01/2017    Past Surgical History:  Procedure Laterality Date   APPENDECTOMY     CESAREAN SECTION N/A 02/04/2020   Procedure: CESAREAN SECTION;  Surgeon: Rande Brunt, MD;  Location: [facility];  Service: Obstetrics;  Laterality: N/A;   LAPAROSCOPIC APPENDECTOMY N/A 04/02/2017   Procedure: APPENDECTOMY LAPAROSCOPIC;  Surgeon: Darnell Level, MD;  Location: [facility];  Service: General;  Laterality: N/A;     OB History     Gravida  2   Para  1   Term  1   Preterm  0   AB  1   Living  1      SAB  1   IAB  0   Ectopic  0   Multiple      Live Births  1           Family History  Problem Relation Age of Onset   Diabetes Father    Hypertension Father    Diabetes Mother     Social History   Tobacco Use   Smoking status:  Unknown   Smokeless tobacco: Never  Vaping Use   Vaping Use: Every day  Substance Use Topics   Alcohol use: Not Currently    Comment: occ   Drug use: Never    Home Medications Prior to Admission medications   Medication Sig Start Date End Date Taking? Authorizing Provider  Elastic Bandages & Supports (FINGER COTS MEDIUM ) MISC Use as needed to apply medication to anal region. 04/24/20   Molpus, John, MD  Lidocaine 5 % CREA Apply to anal region as needed for pain. 04/24/20   Molpus, Jonny Ruiz, MD  misoprostol (CYTOTEC) 200 MCG tablet Place four tablets in between your gums and cheeks (two tablets on each side) as instructed Patient not taking: Reported on 09/25/2018 09/17/18 07/01/19  Levie Heritage, DO    Allergies    Fish allergy and Peanut-containing drug products  Review of Systems   Review of Systems  Constitutional:  Negative for fever.  HENT:  Negative for sore throat.   Eyes:  Negative for visual disturbance.  Respiratory:  Negative for cough and shortness of breath.   Cardiovascular:  Negative for chest pain.  Gastrointestinal:  Positive for abdominal pain. Negative for  nausea and vomiting.  Genitourinary:  Positive for pelvic pain. Negative for difficulty urinating.  Musculoskeletal:  Negative for back pain and neck pain.  Skin:  Negative for rash.  Neurological:  Negative for syncope and headaches.   Physical Exam Updated Vital Signs BP 125/63   Pulse 78   Temp 98.1 F (36.7 C) (Oral)   Resp 16   Ht 5\' 3"  (1.6 m)   Wt 127 kg   LMP 08/16/2020 (Exact Date)   SpO2 96%   BMI 49.60 kg/m   Physical Exam Vitals and nursing note reviewed.  Constitutional:      General: She is not in acute distress.    Appearance: She is well-developed. She is not diaphoretic.  HENT:     Head: Normocephalic and atraumatic.  Eyes:     Conjunctiva/sclera: Conjunctivae normal.  Cardiovascular:     Rate and Rhythm: Normal rate.  Pulmonary:     Effort: Pulmonary effort is normal.  No respiratory distress.  Abdominal:     General: There is no distension.     Palpations: Abdomen is soft.     Tenderness: There is abdominal tenderness (right lower quadrant). There is no guarding.  Musculoskeletal:        General: No tenderness.     Cervical back: Normal range of motion.  Skin:    General: Skin is warm and dry.     Findings: No erythema or rash.  Neurological:     Mental Status: She is alert and oriented to person, place, and time.    ED Results / Procedures / Treatments   Labs (all labs ordered are listed, but only abnormal results are displayed) Labs Reviewed  CBC WITH DIFFERENTIAL/PLATELET - Abnormal; Notable for the following components:      Result Value   WBC 12.1 (*)    Hemoglobin 9.9 (*)    HCT 31.7 (*)    MCV 77.7 (*)    MCH 24.3 (*)    RDW 18.4 (*)    All other components within normal limits  URINALYSIS, ROUTINE W REFLEX MICROSCOPIC  PREGNANCY, URINE  COMPREHENSIVE METABOLIC PANEL  LIPASE, BLOOD    EKG None  Radiology No results found.  Procedures Procedures   Medications Ordered in ED Medications  iohexol (OMNIPAQUE) 300 MG/ML solution 100 mL (has no administration in time range)  lactated ringers bolus 1,000 mL (1,000 mLs Intravenous New Bag/Given 08/23/20 0649)  ketorolac (TORADOL) 30 MG/ML injection 30 mg (30 mg Intravenous Given 08/23/20 10/23/20)    ED Course  I have reviewed the triage vital signs and the nursing notes.  Pertinent labs & imaging results that were available during my care of the patient were reviewed by me and considered in my medical decision making (see chart for details).    MDM Rules/Calculators/A&P                            25yo female with history of GERD, C-section February 04, 2020, appendectomy, presents with concern for right-sided abdominal pain that began last night and is located right above her CS scar.  Labs pending at time of transfer of care.  CT pending to evaluate for signs of hernia that  may be occult on exam or other acute abnormalities.  Care signed out with studies pending.     Final Clinical Impression(s) / ED Diagnoses Final diagnoses:  Abdominal pain, unspecified abdominal location    Rx / DC Orders ED Discharge  Orders     None        Alvira Monday, MD 08/23/20 [phone]

## 2020-08-23 NOTE — ED Notes (Signed)
Pt states she has appointment with GYN dr on Friday

## 2020-08-24 ENCOUNTER — Emergency Department (HOSPITAL_BASED_OUTPATIENT_CLINIC_OR_DEPARTMENT_OTHER)
Admission: EM | Admit: 2020-08-24 | Discharge: 2020-08-24 | Discharge status: Absent without leave | Payer: Medicaid Other

## 2020-08-24 LAB — GC/CHLAMYDIA PROBE AMP (~~LOC~~) NOT AT ARMC
Chlamydia: NEGATIVE
Comment: NEGATIVE
Comment: NORMAL
Neisseria Gonorrhea: NEGATIVE

## 2021-03-14 ENCOUNTER — Other Ambulatory Visit (HOSPITAL_BASED_OUTPATIENT_CLINIC_OR_DEPARTMENT_OTHER): Payer: Self-pay | Admitting: Physician Assistant

## 2021-03-16 ENCOUNTER — Other Ambulatory Visit (HOSPITAL_BASED_OUTPATIENT_CLINIC_OR_DEPARTMENT_OTHER): Payer: Self-pay | Admitting: Physician Assistant

## 2021-03-16 DIAGNOSIS — R1032 Left lower quadrant pain: Secondary | ICD-10-CM

## 2021-03-22 ENCOUNTER — Ambulatory Visit (HOSPITAL_BASED_OUTPATIENT_CLINIC_OR_DEPARTMENT_OTHER): Admission: RE | Admit: 2021-03-22 | Payer: Medicaid Other | Source: Ambulatory Visit

## 2021-03-26 ENCOUNTER — Ambulatory Visit (HOSPITAL_BASED_OUTPATIENT_CLINIC_OR_DEPARTMENT_OTHER): Admission: RE | Admit: 2021-03-26 | Payer: Medicaid Other | Source: Ambulatory Visit

## 2021-04-03 ENCOUNTER — Telehealth (HOSPITAL_BASED_OUTPATIENT_CLINIC_OR_DEPARTMENT_OTHER): Payer: Self-pay

## 2021-04-05 ENCOUNTER — Telehealth (HOSPITAL_BASED_OUTPATIENT_CLINIC_OR_DEPARTMENT_OTHER): Payer: Self-pay

## 2021-07-06 ENCOUNTER — Encounter (HOSPITAL_BASED_OUTPATIENT_CLINIC_OR_DEPARTMENT_OTHER): Payer: Self-pay | Admitting: Emergency Medicine

## 2021-07-06 ENCOUNTER — Emergency Department (HOSPITAL_BASED_OUTPATIENT_CLINIC_OR_DEPARTMENT_OTHER)
Admission: EM | Admit: 2021-07-06 | Discharge: 2021-07-07 | Disposition: A | Discharge status: Home / Self Care | Payer: Medicaid Other | Attending: Emergency Medicine | Admitting: Emergency Medicine

## 2021-07-06 DIAGNOSIS — Z91010 Allergy to peanuts: Secondary | ICD-10-CM | POA: Insufficient documentation

## 2021-07-06 DIAGNOSIS — G4489 Other headache syndrome: Secondary | ICD-10-CM

## 2021-07-06 DIAGNOSIS — R519 Headache, unspecified: Secondary | ICD-10-CM | POA: Diagnosis present

## 2021-07-06 NOTE — ED Triage Notes (Signed)
Pt state started with headache now has sinus pressure, light sensitivity, hearing distortion and ringing ,neck pain.

## 2021-07-07 LAB — BASIC METABOLIC PANEL
Anion gap: 5 (ref 5–15)
BUN: 15 mg/dL (ref 6–20)
CO2: 25 mmol/L (ref 22–32)
Calcium: 8.7 mg/dL — ABNORMAL LOW (ref 8.9–10.3)
Chloride: 108 mmol/L (ref 98–111)
Creatinine, Ser: 0.9 mg/dL (ref 0.44–1.00)
GFR, Estimated: >60 mL/min (ref >60)
Glucose, Bld: 99 mg/dL (ref 70–99)
Potassium: 3.8 mmol/L (ref 3.5–5.1)
Sodium: 138 mmol/L (ref 135–145)

## 2021-07-07 LAB — DIFFERENTIAL
Abs Immature Granulocytes: 0 10*3/uL (ref 0.00–0.07)
Basophils Absolute: 0 10*3/uL (ref 0.0–0.1)
Basophils Relative: 0 %
Eosinophils Absolute: 0.5 10*3/uL (ref 0.0–0.5)
Eosinophils Relative: 3 %
Lymphocytes Relative: 33 %
Lymphs Abs: 5.8 10*3/uL — ABNORMAL HIGH (ref 0.7–4.0)
Monocytes Absolute: 0.2 10*3/uL (ref 0.1–1.0)
Monocytes Relative: 1 %
Neutro Abs: 11.2 10*3/uL — ABNORMAL HIGH (ref 1.7–7.7)
Neutrophils Relative %: 63 %

## 2021-07-07 LAB — CBC
HCT: 33.7 % — ABNORMAL LOW (ref 36.0–46.0)
Hemoglobin: 10.7 g/dL — ABNORMAL LOW (ref 12.0–15.0)
MCH: 26.9 pg (ref 26.0–34.0)
MCHC: 31.8 g/dL (ref 30.0–36.0)
MCV: 84.7 fL (ref 80.0–100.0)
Platelets: 372 10*3/uL (ref 150–400)
RBC: 3.98 MIL/uL (ref 3.87–5.11)
RDW: 18 % — ABNORMAL HIGH (ref 11.5–15.5)
WBC: 17.7 10*3/uL — ABNORMAL HIGH (ref 4.0–10.5)
nRBC: 0 % (ref 0.0–0.2)

## 2021-07-07 LAB — HCG, QUANTITATIVE, PREGNANCY: hCG, Beta Chain, Quant, S: <1 m[IU]/mL (ref <5)

## 2021-07-07 MED ORDER — KETOROLAC TROMETHAMINE 15 MG/ML IJ SOLN
15.0000 mg | Freq: Once | INTRAMUSCULAR | Status: AC
  Administered 2021-07-07: 15 mg via INTRAVENOUS
  Filled 2021-07-07: qty 1

## 2021-07-07 MED ORDER — SODIUM CHLORIDE 0.9 % IV BOLUS
1000.0000 mL | Freq: Once | INTRAVENOUS | Status: AC
  Administered 2021-07-07: 1000 mL via INTRAVENOUS

## 2021-07-07 MED ORDER — TIZANIDINE HCL 2 MG PO TABS: 2.0000 mg | ORAL_TABLET | Freq: Three times a day (TID) | ORAL | 0 refills | Status: DC | PRN

## 2021-07-07 MED ORDER — PROCHLORPERAZINE MALEATE 10 MG PO TABS: 10.0000 mg | ORAL_TABLET | Freq: Two times a day (BID) | ORAL | 0 refills | Status: DC | PRN

## 2021-07-07 MED ORDER — TIZANIDINE HCL 2 MG PO TABS
2.0000 mg | ORAL_TABLET | Freq: Once | ORAL | Status: AC
Start: 2021-07-07 — End: 2021-07-07
  Administered 2021-07-07: 2 mg via ORAL
  Filled 2021-07-07: qty 1

## 2021-07-07 MED ORDER — PROCHLORPERAZINE EDISYLATE 10 MG/2ML IJ SOLN
10.0000 mg | Freq: Once | INTRAMUSCULAR | Status: AC
  Administered 2021-07-07: 10 mg via INTRAVENOUS
  Filled 2021-07-07: qty 2

## 2021-07-07 MED ORDER — DIPHENHYDRAMINE HCL 50 MG/ML IJ SOLN
25.0000 mg | Freq: Once | INTRAMUSCULAR | Status: AC
  Administered 2021-07-07: 25 mg via INTRAVENOUS
  Filled 2021-07-07: qty 1

## 2021-07-07 MED ORDER — MAGNESIUM SULFATE 2 GM/50ML IV SOLN
2.0000 g | Freq: Once | INTRAVENOUS | Status: AC
Start: 2021-07-07 — End: 2021-07-07
  Administered 2021-07-07: 2 g via INTRAVENOUS
  Filled 2021-07-07: qty 50

## 2021-07-07 NOTE — ED Notes (Addendum)
Patient verbalizes understanding of discharge instructions. Opportunity for questioning and answers were provided. Armband removed by staff, pt discharged from ED. Ambulated out to lobby with spouse  
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
## 2021-08-07 ENCOUNTER — Ambulatory Visit: Payer: Medicaid Other | Admitting: Psychiatry

## 2021-12-04 ENCOUNTER — Encounter (HOSPITAL_BASED_OUTPATIENT_CLINIC_OR_DEPARTMENT_OTHER): Payer: Self-pay

## 2021-12-04 ENCOUNTER — Other Ambulatory Visit: Payer: Self-pay

## 2021-12-04 ENCOUNTER — Emergency Department (HOSPITAL_BASED_OUTPATIENT_CLINIC_OR_DEPARTMENT_OTHER)
Admission: EM | Admit: 2021-12-04 | Discharge: 2021-12-05 | Disposition: A | Discharge status: Home / Self Care | Payer: Medicaid Other | Attending: Emergency Medicine | Admitting: Emergency Medicine

## 2021-12-04 DIAGNOSIS — K921 Melena: Secondary | ICD-10-CM | POA: Insufficient documentation

## 2021-12-04 DIAGNOSIS — K625 Hemorrhage of anus and rectum: Secondary | ICD-10-CM

## 2021-12-04 DIAGNOSIS — Z91010 Allergy to peanuts: Secondary | ICD-10-CM | POA: Insufficient documentation

## 2021-12-04 LAB — COMPREHENSIVE METABOLIC PANEL
ALT: 11 U/L (ref 0–44)
AST: 13 U/L — ABNORMAL LOW (ref 15–41)
Albumin: 4 g/dL (ref 3.5–5.0)
Alkaline Phosphatase: 69 U/L (ref 38–126)
Anion gap: 5 (ref 5–15)
BUN: 14 mg/dL (ref 6–20)
CO2: 24 mmol/L (ref 22–32)
Calcium: 8.9 mg/dL (ref 8.9–10.3)
Chloride: 106 mmol/L (ref 98–111)
Creatinine, Ser: 0.69 mg/dL (ref 0.44–1.00)
GFR, Estimated: >60 mL/min (ref >60)
Glucose, Bld: 106 mg/dL — ABNORMAL HIGH (ref 70–99)
Potassium: 3.5 mmol/L (ref 3.5–5.1)
Sodium: 135 mmol/L (ref 135–145)
Total Bilirubin: 0.3 mg/dL (ref 0.3–1.2)
Total Protein: 8 g/dL (ref 6.5–8.1)

## 2021-12-04 LAB — CBC
HCT: 34.8 % — ABNORMAL LOW (ref 36.0–46.0)
Hemoglobin: 11.2 g/dL — ABNORMAL LOW (ref 12.0–15.0)
MCH: 26.5 pg (ref 26.0–34.0)
MCHC: 32.2 g/dL (ref 30.0–36.0)
MCV: 82.3 fL (ref 80.0–100.0)
Platelets: 371 10*3/uL (ref 150–400)
RBC: 4.23 MIL/uL (ref 3.87–5.11)
RDW: 16.2 % — ABNORMAL HIGH (ref 11.5–15.5)
WBC: 14.8 10*3/uL — ABNORMAL HIGH (ref 4.0–10.5)
nRBC: 0 % (ref 0.0–0.2)

## 2021-12-04 LAB — OCCULT BLOOD X 1 CARD TO LAB, STOOL: Fecal Occult Bld: POSITIVE — AB

## 2021-12-04 LAB — PREGNANCY, URINE: Preg Test, Ur: NEGATIVE

## 2021-12-04 NOTE — ED Triage Notes (Signed)
Pt reports she was using the toilet and she noticed there was bright red blood in stool. Pt denies hx of hemorrhoids, no blood thinners. Denies N/V/D. Denies dizziness or lightheadedness. Endorses some itching discomfort to rectum.

## 2021-12-04 NOTE — ED Provider Notes (Incomplete)
[facility] EMERGENCY DEPARTMENT Provider Note   CSN: [id number] Arrival date & time: 12/04/21  2019     History {Add pertinent medical, surgical, social history, OB history to HPI:1} Chief Complaint  Patient presents with  . Rectal Bleeding    Miranda Mack is a 26 y.o. female presenting to the ED with 1 episode of rectal bleeding.  States earlier today she went to use the restroom, had a bowel movement, noticed some bright red blood.  First wiped and noticed a couple drops, then wiped again and noticed "a little bit more".  Looked at the toilet bowl and noticed that there was "a lot of red".  Believes she may have had hemorrhoids in the past, though is unsure.  Denies vaginal bleeding, pain, or discharge.  Denies urinary symptoms or blood in the urine.  Denies abdominal pain, back pain, new injury, or recent diarrhea/gastroenteritis.  No hx of bloody bowel movements prior to this.  Denies them being dark black or brownish-black in color.  Denies fevers, shortness of breath, chills, or chest pain.  No other complaints at this time.  The history is provided by the patient and medical records.  Rectal Bleeding      Home Medications Prior to Admission medications   Medication Sig Start Date End Date Taking? Authorizing Provider  prochlorperazine (COMPAZINE) 10 MG tablet Take 1 tablet (10 mg total) by mouth 2 (two) times daily as needed for nausea or vomiting. 07/07/21   Zadie Rhine, MD  tiZANidine (ZANAFLEX) 2 MG tablet Take 1 tablet (2 mg total) by mouth every 8 (eight) hours as needed for muscle spasms. 07/07/21   Zadie Rhine, MD  misoprostol (CYTOTEC) 200 MCG tablet Place four tablets in between your gums and cheeks (two tablets on each side) as instructed Patient not taking: Reported on 09/25/2018 09/17/18 07/01/19  Levie Heritage, DO      Allergies    Fish allergy and Peanut-containing drug products    Review of Systems   Review of Systems  Gastrointestinal:   Positive for blood in stool and hematochezia.    Physical Exam Updated Vital Signs BP 118/79 (BP Location: Right Arm)   Pulse 100   Temp 97.8 F (36.6 C)   Resp 18   Ht 5\' 3"  (1.6 m)   Wt 131.5 kg   LMP 11/14/2021 (Exact Date)   SpO2 100%   BMI 51.37 kg/m  Physical Exam Vitals and nursing note reviewed. Exam conducted with a chaperone present.  Constitutional:      General: She is not in acute distress.    Appearance: She is well-developed.  HENT:     Head: Normocephalic and atraumatic.  Eyes:     Conjunctiva/sclera: Conjunctivae normal.  Cardiovascular:     Rate and Rhythm: Normal rate and regular rhythm.     Heart sounds: No murmur heard. Pulmonary:     Effort: Pulmonary effort is normal. No respiratory distress.     Breath sounds: Normal breath sounds.  Abdominal:     Palpations: Abdomen is soft.     Tenderness: There is no abdominal tenderness.  Genitourinary:    Exam position: Knee-chest position.     Vagina: No bleeding.     Rectum: Guaiac result positive. No mass, tenderness, anal fissure or external hemorrhoid. Normal anal tone.     Comments: No blood visualized in stool from rectal sample, however FOBT positive Musculoskeletal:        General: No swelling.     Cervical  back: Neck supple.  Skin:    General: Skin is warm and dry.     Capillary Refill: Capillary refill takes less than 2 seconds.  Neurological:     Mental Status: She is alert.  Psychiatric:        Mood and Affect: Mood normal.     ED Results / Procedures / Treatments   Labs (all labs ordered are listed, but only abnormal results are displayed) Labs Reviewed  COMPREHENSIVE METABOLIC PANEL - Abnormal; Notable for the following components:      Result Value   Glucose, Bld 106 (*)    AST 13 (*)    All other components within normal limits  CBC - Abnormal; Notable for the following components:   WBC 14.8 (*)    Hemoglobin 11.2 (*)    HCT 34.8 (*)    RDW 16.2 (*)    All other  components within normal limits  OCCULT BLOOD X 1 CARD TO LAB, STOOL - Abnormal; Notable for the following components:   Fecal Occult Bld POSITIVE (*)    All other components within normal limits  PREGNANCY, URINE  POC OCCULT BLOOD, ED    EKG None  Radiology No results found.  Procedures Procedures  {Document cardiac monitor, telemetry assessment procedure when appropriate:1}  Medications Ordered in ED Medications - No data to display  ED Course/ Medical Decision Making/ A&P Clinical Course as of 12/05/21 0000  Wed Dec 05, 2021  0000 Fecal Occult Blood, POC(!): POSITIVE [AC]    Clinical Course User Index [AC] Prince Rome, PA-C                           Medical Decision Making Amount and/or Complexity of Data Reviewed Labs: ordered.   26 y.o. female presents to the ED for concern of Rectal Bleeding   This involves an extensive number of treatment options, and is a complaint that carries with it a high risk of complications and morbidity.  The emergent differential diagnosis prior to evaluation includes, but is not limited to: ***  This is not an exhaustive differential.   Past Medical History / Co-morbidities / Social History: Hx of *** Social Determinants of Health include: ***  Additional History:  Obtained by chart review.  Notably ***  Lab Tests: I ordered, and personally interpreted labs.  The pertinent results include:      Imaging Studies: None  ED Course: Pt well-appearing on exam.  Nontoxic, nonseptic appearing in NAD.  Does not appear pale or diaphoretic.  Presenting to the ED with complaints of 1 episode of blood in the stool.  Happened earlier today when she was passing a bowel movement.  Noted some bright red blood.  Without abdominal pain, N/V, fevers, recent episodes of diarrhea, back pain, urinary symptoms, or vaginal symptoms.  Abdomen soft, non-TTP.  Nurse chaperone present during exam.  No evidence of external hemorrhoids, fissures,  abscesses, fistulas, or overt blood on exam.  FOBT positive.  Considered imaging, however low suspicion for ulcerative colitis, diverticulosis, diverticulitis, or Crohn's at this time.  Recommend close follow-up with GI.  Suspicious of possible internal hemorrhoids.  Patient reports satisfaction with today's encounter.  Strict return precautions discussed.  Patient remains hemodynamically stable.  Patient in NAD and in good condition at time of discharge.  Disposition: After consideration the patient's encounter today, I do not feel today's workup suggests an emergent condition requiring admission or immediate intervention beyond what has  been performed at this time.  Safe for discharge; instructed to return immediately for worsening symptoms, change in symptoms or any other concerns.  I have reviewed the patients home medicines and have made adjustments as needed.  Discussed course of treatment with the patient, whom demonstrated understanding.  Patient in agreement and has no further questions.    I discussed this case with my attending physician Dr. Ralene Bathe, who agreed with the proposed treatment course and cosigned this note including patient's presenting symptoms, physical exam, and planned diagnostics and interventions.  Attending physician stated agreement with plan or made changes to plan which were implemented.     This chart was dictated using voice recognition software.  Despite best efforts to proofread, errors can occur which can change the documentation meaning.   {Document critical care time when appropriate:1} {Document review of labs and clinical decision tools ie heart score, Chads2Vasc2 etc:1}  {Document your independent review of radiology images, and any outside records:1} {Document your discussion with family members, caretakers, and with consultants:1} {Document social determinants of health affecting pt's care:1} {Document your decision making why or why not admission,  treatments were needed:1} Final Clinical Impression(s) / ED Diagnoses Final diagnoses:  Rectal bleeding    Rx / DC Orders ED Discharge Orders     None

## 2021-12-04 NOTE — ED Provider Notes (Signed)
[facility] EMERGENCY DEPARTMENT Provider Note   CSN: [id number] Arrival date & time: 12/04/21  2019     History  Chief Complaint  Patient presents with   Rectal Bleeding    Miranda Mack is a 26 y.o. female presenting to the ED with 1 episode of rectal bleeding.  States earlier today she went to use the restroom, had a bowel movement, noticed some bright red blood.  First wiped and noticed a couple drops, then wiped again and noticed "a little bit more".  Looked at the toilet bowl and noticed that there was "a lot of red".  Believes she may have had hemorrhoids in the past, though is unsure.  Denies vaginal bleeding, pain, or discharge.  Denies urinary symptoms or blood in the urine.  Denies abdominal pain, back pain, new injury, or recent diarrhea/gastroenteritis.  No hx of bloody bowel movements prior to this.  Denies them being dark black or brownish-black in color.  Denies fevers, shortness of breath, chills, or chest pain.  No other complaints at this time.  The history is provided by the patient and medical records.  Rectal Bleeding     Home Medications Prior to Admission medications   Medication Sig Start Date End Date Taking? Authorizing Provider  prochlorperazine (COMPAZINE) 10 MG tablet Take 1 tablet (10 mg total) by mouth 2 (two) times daily as needed for nausea or vomiting. 07/07/21   Zadie Rhine, MD  tiZANidine (ZANAFLEX) 2 MG tablet Take 1 tablet (2 mg total) by mouth every 8 (eight) hours as needed for muscle spasms. 07/07/21   Zadie Rhine, MD  misoprostol (CYTOTEC) 200 MCG tablet Place four tablets in between your gums and cheeks (two tablets on each side) as instructed Patient not taking: Reported on 09/25/2018 09/17/18 07/01/19  Levie Heritage, DO      Allergies    Fish allergy and Peanut-containing drug products    Review of Systems   Review of Systems  Gastrointestinal:  Positive for blood in stool and hematochezia.    Physical  Exam Updated Vital Signs BP 125/86   Pulse 97   Temp 98 F (36.7 C)   Resp 18   Ht 5\' 3"  (1.6 m)   Wt 131.5 kg   LMP 11/14/2021 (Exact Date)   SpO2 100%   BMI 51.37 kg/m  Physical Exam Vitals and nursing note reviewed. Exam conducted with a chaperone present.  Constitutional:      General: She is not in acute distress.    Appearance: Normal appearance. She is well-developed. She is obese. She is not ill-appearing, toxic-appearing or diaphoretic.  HENT:     Head: Normocephalic and atraumatic.  Eyes:     Conjunctiva/sclera: Conjunctivae normal.  Cardiovascular:     Rate and Rhythm: Normal rate and regular rhythm.     Pulses:          Radial pulses are 2+ on the right side and 2+ on the left side.     Heart sounds: No murmur heard. Pulmonary:     Effort: Pulmonary effort is normal. No respiratory distress.     Breath sounds: Normal breath sounds.     Comments: CTAB, able to communicate without difficulty, without increased respiratory effort Abdominal:     General: Abdomen is protuberant. Bowel sounds are normal.     Palpations: Abdomen is soft. There is no pulsatile mass.     Tenderness: There is no abdominal tenderness. There is no right CVA tenderness, left CVA tenderness, guarding  or rebound. Negative signs include Murphy's sign and McBurney's sign.  Genitourinary:    Exam position: Knee-chest position.     Vagina: No vaginal discharge or bleeding.     Rectum: Guaiac result positive. No mass, tenderness, anal fissure or external hemorrhoid. Normal anal tone.     Comments: No blood visualized in stool from rectal sample.  FOBT positive Musculoskeletal:        General: No swelling.     Cervical back: Neck supple. No rigidity.  Skin:    General: Skin is warm and dry.     Capillary Refill: Capillary refill takes less than 2 seconds.     Coloration: Skin is not jaundiced or pale.  Neurological:     Mental Status: She is alert and oriented to person, place, and time.      Gait: Gait normal.  Psychiatric:        Mood and Affect: Mood normal.     ED Results / Procedures / Treatments   Labs (all labs ordered are listed, but only abnormal results are displayed) Labs Reviewed  COMPREHENSIVE METABOLIC PANEL - Abnormal; Notable for the following components:      Result Value   Glucose, Bld 106 (*)    AST 13 (*)    All other components within normal limits  CBC - Abnormal; Notable for the following components:   WBC 14.8 (*)    Hemoglobin 11.2 (*)    HCT 34.8 (*)    RDW 16.2 (*)    All other components within normal limits  OCCULT BLOOD X 1 CARD TO LAB, STOOL - Abnormal; Notable for the following components:   Fecal Occult Bld POSITIVE (*)    All other components within normal limits  PREGNANCY, URINE  POC OCCULT BLOOD, ED    EKG None  Radiology No results found.  Procedures Procedures    Medications Ordered in ED Medications - No data to display  ED Course/ Medical Decision Making/ A&P Clinical Course as of 12/05/21 0009  Wed Dec 05, 2021  0000 Fecal Occult Blood, POC(!): POSITIVE [AC]    Clinical Course User Index [AC] Cecil Cobbs, PA-C                           Medical Decision Making Amount and/or Complexity of Data Reviewed Labs: ordered.   26 y.o. female presents to the ED for concern of Rectal Bleeding   This involves an extensive number of treatment options, and is a complaint that carries with it a high risk of complications and morbidity.  The emergent differential diagnosis prior to evaluation includes, but is not limited to: Hemorrhoid, fissure, abscess, gastroenteritis  This is not an exhaustive differential.   Past Medical History / Co-morbidities / Social History: No PMHx Social Determinants of Health include: None  Additional History:  None  Lab Tests: I ordered, and personally interpreted labs.  The pertinent results include:   Mild, improved, stable anemia Mild elevated WBC 14.8,  nonspecific FOBT positive Urine pregnancy negative CMP without evidence of electrolyte derangement or AKI.  LFTs normal.  Imaging Studies: None  ED Course: Pt well-appearing on exam.  Nontoxic, nonseptic appearing in NAD.  Does not appear pale or diaphoretic.  Afebrile.  Presenting to the ED with complaints of 1 episode of blood in the stool.  Happened earlier today when she was passing a bowel movement.  Noted some bright red blood.  Without abdominal pain, N/V, fevers,  recent episodes of diarrhea, back pain, urinary symptoms, or vaginal symptoms.  Abdomen soft, non-TTP.  Nurse chaperone present during exam.  No evidence of external hemorrhoids, fissures, abscesses, fistulas, or overt blood on exam.  FOBT positive.  Considered imaging, however low suspicion for ulcerative colitis, diverticulosis, diverticulitis, or Crohn's at this time.  Labs indicate stable, and even improved anemia when compared to labs of the last few years.  Does not appear volume depleted.  Suspicious of possible internal hemorrhoids VS gastroenteritis.  Recommend close follow-up with GI.  Also recommend focusing on hydration and bland foods. Patient reports satisfaction with today's encounter.   Patient has remained hemodynamically stable throughout encounter.  Strict return precautions discussed.  Patient in NAD and in good condition at time of discharge.  Disposition: After consideration the patient's encounter today, I do not feel today's workup suggests an emergent condition requiring admission or immediate intervention beyond what has been performed at this time.  Safe for discharge; instructed to return immediately for worsening symptoms, change in symptoms or any other concerns.  I have reviewed the patients home medicines and have made adjustments as needed.  Discussed course of treatment with the patient, whom demonstrated understanding.  Patient in agreement and has no further questions.    I discussed this case with  my attending physician Dr. Madilyn Hook, who agreed with the proposed treatment course and cosigned this note including patient's presenting symptoms, physical exam, and planned diagnostics and interventions.  Attending physician stated agreement with plan or made changes to plan which were implemented.     This chart was dictated using voice recognition software.  Despite best efforts to proofread, errors can occur which can change the documentation meaning.         Final Clinical Impression(s) / ED Diagnoses Final diagnoses:  Blood in stool    Rx / DC Orders ED Discharge Orders     None         Sandrea Hammond 12/05/21 0010    Terrilee Files, MD 12/05/21 1010

## 2021-12-04 NOTE — Discharge Instructions (Addendum)
You were evaluated in the emergency department today for bleeding from the rectum.  Overall your workup today was reassuring.  There was no evidence of external hemorrhoids or fissure.  Please follow-up closely with your GI specialist within the next 2 to 3 days for reevaluation and continued medical management.  You have also been provided a another local GI specialist, should you be able to follow-up sooner.  Focus on hydrating and sticking with mild, bland foods that are easy to digest for the next few days.  Return to the ED for any new or worsening symptoms as discussed.  If you do not have a doctor see the list below.  RESOURCE GUIDE  Chronic Pain Problems: Contact [facility]  [phone] Patients need to be referred by their primary care doctor.  Insufficient Money for Medicine: Contact United Way:  call "211" or [facility] ([phone].  No Primary Care Doctor: Call [facility]  [phone] - can help you locate a primary care doctor that  accepts your insurance, provides certain services, etc. Physician Referral Service- [phone] Agencies that provide inexpensive medical care: [facility]  [phone] [facility] Internal Medicine  [phone] [facility]  [phone] [facility]  [phone] [facility]  [phone] [facility]  [phone]  Medicaid-accepting [facility] Providers: [facility]- [address]  [phone], Mon-Fri 9am-7pm, Sat 9am-1pm [facility]- [address] [place], Suite [place]  [phone] [facility]- [address], [address] [place]  [phone] [facility]- [address]  [phone] Renaye Rakers- [address], [address], [phone]  Only accepts [place] patients after they have their name  applied to their card  Self Pay (no insurance) in [facility] Sickle Cell Patients:  Dr Willey Blade, [facility]  [address] [place], [phone] [facility]- [address] [place]  [phone]       [facility] [place]- [address]       -     [facility]- see information above (Speak to [employer] if you do not have insurance)       -  [facility]- [address] [place], [phone]       -  [facility]- [address],  [phone]       -  [facility]- [address], [phone]       -  Dr Julio Sicks-  [address], [place], [phone]       -  [facility]- [address], [phone]       -  [facility]- [address] [phone], also [address], [phone]       -    [facility]- [address] [place], [phone], 1st & 3rd Saturday   every month, 10am-1pm  1) Find a Doctor and Pay Out of Pocket Although you won't have to find out who is covered by your insurance plan, it is a good idea to ask around and get recommendations. You will then need to call the office and see if the doctor you have chosen will accept you as a new patient and what types of options they offer for patients who are self-pay. Some doctors offer discounts or will  set up payment plans for their patients who do not have insurance, but you will need to ask so you aren't surprised when you get to your appointment.  2) Contact Your Local Health Department Not all health departments have doctors that can see patients for sick visits, but many do, so it is worth a call to see if yours does. If you don't know where your local health department is, you can check in your phone book. The CDC also has a tool to help you locate your state's health department, and many state websites also have listings of all of their local health departments.  3) Find a Walk-in Clinic If your illness is not likely to be very severe or complicated, you may want to try a walk in clinic.  These are popping up all over the country in pharmacies, drugstores, and shopping centers. They're usually staffed by nurse practitioners or physician assistants that have been trained to treat common illnesses and complaints. They're usually fairly quick and inexpensive. However, if you have serious medical issues or chronic medical problems, these are probably not your best option  STD Testing [facility] Department of [facility], STD Clinic, [address], [place], phone [phone] or [phone].  Monday - Friday, call for an appointment. [facility] of [employer], STD Clinic, [address], [place], phone [phone] or [phone].  Monday - Friday, call for an appointment.  Abuse/Neglect: [facility] Child Abuse Hotline 907-209-4292 [facility] Child Abuse Hotline 949-838-2353 (After Hours)  Emergency Shelter:  [facility] (234)799-2983  Maternity Homes: Room at the [place] [facility] (432)166-6862 [facility] [phone]  MRSA Hotline #:   (367)466-3327  [facility] Resources  [facility] [place]     United Way                          [facility] Dept. [address] [place]                       [address]      [address] [place] [address]  Blondell Reveal Phone:  [phone]                                   Phone:  [phone]                 Phone:  [phone]  [facility], [phone] [facility]- [phone]       -     [facility] in [place], [address]                                  [phone], [facility] Child Abuse Hotline (407)216-1177 or (862)115-5424)  160-7371 (After Hours)  Behavioral Health Services  Substance Abuse Resources: Alcohol and  Drug Services  (539)307-4502 [facility] (562)301-7819 [facility] [place] (406) [phone] Floydene Flock (469)677-7662 Residential & Outpatient Substance Abuse Program  [phone]  Psychological Services: [facility]  [phone] [facility]  [phone] [facility], [phone] [place]. [address], [place], ACCESS LINE: 703-368-0904 or 571-809-2269, [employer]za  Dental Assistance  Patients with Medicaid: [facility] [phone] [address]                                           [phone] [employer] Phone:  [phone]                                                  Phone:  [phone]  If unable to pay or uninsured, contact:  [facility] or [facility]. to become qualified for the adult dental clinic.  Patients with Medicaid: [facility] [phone] [address], [phone] [address] [phone]  If unable to pay, or uninsured, contact [facility] [phone]) or [facility] Department [phone] in [place], [phone] in [facility]) to become qualified for the adult dental clinic  Other Low-Cost Community Dental Services: [facility]- [address] [place], [place], [place], [address], [phone], [phone], 2nd and 4th Thursday of the month at 6:30am.  10 clients each day by appointment, can sometimes see walk-in patients if someone does not show for an appointment. [facility]- [address] [place], [place], [address], [phone] [facility] [address] [place], [place], [address], [phone] [facility] Health Department- [phone] [facility] Health Department- [phone] [facility] [phone]

## 2022-01-03 LAB — OB RESULTS CONSOLE GC/CHLAMYDIA
Chlamydia: NEGATIVE
Neisseria Gonorrhea: NEGATIVE

## 2022-01-23 LAB — HEPATITIS C ANTIBODY: HCV Ab: NEGATIVE

## 2022-01-23 LAB — OB RESULTS CONSOLE ABO/RH: RH Type: POSITIVE

## 2022-01-23 LAB — OB RESULTS CONSOLE ANTIBODY SCREEN: Antibody Screen: NEGATIVE

## 2022-01-23 LAB — OB RESULTS CONSOLE RPR: RPR: NONREACTIVE

## 2022-01-23 LAB — OB RESULTS CONSOLE HEPATITIS B SURFACE ANTIGEN: Hepatitis B Surface Ag: NEGATIVE

## 2022-01-23 LAB — OB RESULTS CONSOLE RUBELLA ANTIBODY, IGM: Rubella: IMMUNE

## 2022-01-23 LAB — OB RESULTS CONSOLE HIV ANTIBODY (ROUTINE TESTING): HIV: NONREACTIVE

## 2022-01-27 ENCOUNTER — Inpatient Hospital Stay (HOSPITAL_COMMUNITY)
Admission: AD | Admit: 2022-01-27 | Discharge: 2022-01-28 | Disposition: A | Discharge status: Home / Self Care | Payer: Medicaid Other | Attending: Obstetrics and Gynecology | Admitting: Obstetrics and Gynecology

## 2022-01-27 ENCOUNTER — Encounter (HOSPITAL_COMMUNITY): Payer: Self-pay

## 2022-01-27 DIAGNOSIS — Z3A10 10 weeks gestation of pregnancy: Secondary | ICD-10-CM | POA: Insufficient documentation

## 2022-01-27 DIAGNOSIS — O99611 Diseases of the digestive system complicating pregnancy, first trimester: Secondary | ICD-10-CM | POA: Insufficient documentation

## 2022-01-27 DIAGNOSIS — K5900 Constipation, unspecified: Secondary | ICD-10-CM | POA: Insufficient documentation

## 2022-01-27 LAB — URINALYSIS, ROUTINE W REFLEX MICROSCOPIC
Bilirubin Urine: NEGATIVE
Glucose, UA: NEGATIVE mg/dL
Hgb urine dipstick: NEGATIVE
Ketones, ur: NEGATIVE mg/dL
Leukocytes,Ua: NEGATIVE
Nitrite: NEGATIVE
Protein, ur: NEGATIVE mg/dL
Specific Gravity, Urine: 1.025 (ref 1.005–1.030)
pH: 6 (ref 5.0–8.0)

## 2022-01-27 LAB — POCT PREGNANCY, URINE: Preg Test, Ur: POSITIVE — AB

## 2022-01-27 MED ORDER — METOCLOPRAMIDE HCL 10 MG PO TABS
10.0000 mg | ORAL_TABLET | Freq: Once | ORAL | Status: AC
  Administered 2022-01-27: 10 mg via ORAL
  Filled 2022-01-27: qty 1

## 2022-01-27 MED ORDER — IBUPROFEN 800 MG PO TABS
800.0000 mg | ORAL_TABLET | Freq: Once | ORAL | Status: AC
  Administered 2022-01-27: 800 mg via ORAL
  Filled 2022-01-27: qty 1

## 2022-01-27 NOTE — Discharge Instructions (Signed)

## 2022-01-27 NOTE — MAU Note (Addendum)
..  Miranda Mack is a 27 y.o. at Unknown here in MAU reporting: lower left sided abdominal pain that radiates up her to her back Denies vaginal bleeding. Reports she had an ultrasound at her OB last Wednesday and had an IUP with heartbeat with nothing concerning was found.  FHT: unable to doppler in triage LMP: 11/14/2021 Pain score: 4/10 Vitals:   01/27/22 2145  BP: 129/73  Pulse: 95  Resp: 16  Temp: 98.5 F (36.9 C)  SpO2: 98%

## 2022-01-27 NOTE — MAU Provider Note (Signed)
History     CSN: [id number]  Arrival date and time: 01/27/22 2115   Event Date/Time   First Provider Initiated Contact with Patient 01/27/22 2225      Chief Complaint  Patient presents with   Abdominal Pain   HPI  Miranda Mack is a 27 y.o. G3P1011 at [redacted]w[redacted]d who presents for evaluation of left sided abdominal pain. Patient reports she is having pain all along the left side of her abdomen that radiates up her left side. She reports it makes it difficult to move. She describes it as pulling but reports she hasn't done anything to pull a muscle. Patient rates the pain as a 4/10 and has tried tylenol for the pain with no relief. She also reports constant nausea which makes it hard for her to drink water. She states she hasn't had a bowel movement in 2 days. She denies any vaginal bleeding, discharge, and leaking of fluid. Denies any diarrhea or any urinary complaints.  OB History     Gravida  3   Para  1   Term  1   Preterm  0   AB  1   Living  1      SAB  1   IAB  0   Ectopic  0   Multiple      Live Births  1           Past Medical History:  Diagnosis Date   GERD (gastroesophageal reflux disease)    Miscarriage within last 12 months    Ovarian cyst     Past Surgical History:  Procedure Laterality Date   APPENDECTOMY     CESAREAN SECTION N/A 02/04/2020   Procedure: CESAREAN SECTION;  Surgeon: Jonelle Sidle, MD;  Location: [facility];  Service: Obstetrics;  Laterality: N/A;   LAPAROSCOPIC APPENDECTOMY N/A 04/02/2017   Procedure: APPENDECTOMY LAPAROSCOPIC;  Surgeon: Armandina Gemma, MD;  Location: [facility];  Service: General;  Laterality: N/A;    Family History  Problem Relation Age of Onset   Diabetes Father    Hypertension Father    Diabetes Mother     Social History   Tobacco Use   Smoking status: Never   Smokeless tobacco: Never  Vaping Use   Vaping Use: Never used  Substance Use Topics   Alcohol use: Yes    Comment: occ   Drug use:  Never    Allergies:  Allergies  Allergen Reactions   Fish Allergy Anaphylaxis   Peanut-Containing Drug Products Anaphylaxis    Medications Prior to Admission  Medication Sig Dispense Refill Last Dose   prochlorperazine (COMPAZINE) 10 MG tablet Take 1 tablet (10 mg total) by mouth 2 (two) times daily as needed for nausea or vomiting. 10 tablet 0    tiZANidine (ZANAFLEX) 2 MG tablet Take 1 tablet (2 mg total) by mouth every 8 (eight) hours as needed for muscle spasms. 10 tablet 0     Review of Systems  Constitutional: Negative.  Negative for fatigue and fever.  HENT: Negative.    Respiratory: Negative.  Negative for shortness of breath.   Cardiovascular: Negative.  Negative for chest pain.  Gastrointestinal:  Positive for abdominal pain and constipation. Negative for diarrhea, nausea and vomiting.  Genitourinary: Negative.  Negative for dysuria, vaginal bleeding and vaginal discharge.  Neurological: Negative.  Negative for dizziness and headaches.   Physical Exam   Blood pressure 129/73, pulse 95, temperature 98.5 F (36.9 C), temperature source Oral, resp. rate 16, height 5'  3" (1.6 m), weight 131.1 kg, last menstrual period 11/14/2021, SpO2 98 %, unknown if currently breastfeeding.  Patient Vitals for the past 24 hrs:  BP Temp Temp src Pulse Resp SpO2 Height Weight  01/27/22 2145 129/73 98.5 F (36.9 C) Oral 95 16 98 % 5\' 3"  (1.6 m) 131.1 kg    Physical Exam Vitals and nursing note reviewed.  Constitutional:      General: She is not in acute distress.    Appearance: She is well-developed.  HENT:     Head: Normocephalic.  Eyes:     Pupils: Pupils are equal, round, and reactive to light.  Cardiovascular:     Rate and Rhythm: Normal rate and regular rhythm.     Heart sounds: Normal heart sounds.  Pulmonary:     Effort: Pulmonary effort is normal. No respiratory distress.     Breath sounds: Normal breath sounds.  Abdominal:     General: Bowel sounds are normal. There  is no distension.     Palpations: Abdomen is soft.     Tenderness: There is abdominal tenderness in the left upper quadrant.  Skin:    General: Skin is warm and dry.  Neurological:     Mental Status: She is alert and oriented to person, place, and time.  Psychiatric:        Mood and Affect: Mood normal.        Behavior: Behavior normal.        Thought Content: Thought content normal.        Judgment: Judgment normal.     MAU Course  Procedures  Results for orders placed or performed during the hospital encounter of 01/27/22 (from the past 24 hour(s))  Pregnancy, urine POC     Status: Abnormal   Collection Time: 01/27/22  9:38 PM  Result Value Ref Range   Preg Test, Ur POSITIVE (A) NEGATIVE  Urinalysis, Routine w reflex microscopic     Status: Abnormal   Collection Time: 01/27/22 10:22 PM  Result Value Ref Range   Color, Urine YELLOW YELLOW   APPearance HAZY (A) CLEAR   Specific Gravity, Urine 1.025 1.005 - 1.030   pH 6.0 5.0 - 8.0   Glucose, UA NEGATIVE NEGATIVE mg/dL   Hgb urine dipstick NEGATIVE NEGATIVE   Bilirubin Urine NEGATIVE NEGATIVE   Ketones, ur NEGATIVE NEGATIVE mg/dL   Protein, ur NEGATIVE NEGATIVE mg/dL   Nitrite NEGATIVE NEGATIVE   Leukocytes,Ua NEGATIVE NEGATIVE    MDM Prenatal records from community office not on file.  Labs ordered and reviewed.   UA, UPT Pt informed that the ultrasound is considered a limited OB ultrasound and is not intended to be a complete ultrasound exam.  Patient also informed that the ultrasound is not being completed with the intent of assessing for fetal or placental anomalies or any pelvic abnormalities.  Explained that the purpose of today's ultrasound is to assess for  viability.  Patient acknowledges the purpose of the exam and the limitations of the study.    Live IUP consistent with dates with FHR 144 bpm  Reglan PO PO hydration  Given location of pain and live IUP, low suspicion for any obstetric complication. No  leukocytes or nitrites in urine, no CVA tenderness- low suspicion for kidney etiology  Discussed that this is likely constipation or musculoskeletal. Patient driving and unable to have stronger medication for pain in MAU  Constipation precautions reviewed at length including OTC medications and diet changes including kiwi use. Emphasized importance of  PO hydration in pregnancy.  Assessment and Plan   1. Constipation during pregnancy in first trimester   2. [redacted] weeks gestation of pregnancy     -Discharge home in stable condition -Abdominal pain precautions discussed -Patient advised to follow-up with OB as scheduled for prenatal care -Patient may return to MAU as needed or if her condition were to change or worsen  Rolm Bookbinder, CNM 01/27/2022, 10:25 PM

## 2022-02-25 ENCOUNTER — Inpatient Hospital Stay (HOSPITAL_COMMUNITY)
Admission: AD | Admit: 2022-02-25 | Discharge: 2022-02-25 | Disposition: A | Discharge status: Home / Self Care | Payer: Medicaid Other | Attending: Obstetrics and Gynecology | Admitting: Obstetrics and Gynecology

## 2022-02-25 ENCOUNTER — Encounter (HOSPITAL_COMMUNITY): Payer: Self-pay | Admitting: Obstetrics and Gynecology

## 2022-02-25 ENCOUNTER — Other Ambulatory Visit: Payer: Self-pay

## 2022-02-25 ENCOUNTER — Inpatient Hospital Stay (HOSPITAL_COMMUNITY): Payer: Medicaid Other

## 2022-02-25 DIAGNOSIS — Z3A14 14 weeks gestation of pregnancy: Secondary | ICD-10-CM

## 2022-02-25 DIAGNOSIS — O99612 Diseases of the digestive system complicating pregnancy, second trimester: Secondary | ICD-10-CM | POA: Insufficient documentation

## 2022-02-25 DIAGNOSIS — R109 Unspecified abdominal pain: Secondary | ICD-10-CM

## 2022-02-25 DIAGNOSIS — O26892 Other specified pregnancy related conditions, second trimester: Secondary | ICD-10-CM | POA: Insufficient documentation

## 2022-02-25 DIAGNOSIS — O26899 Other specified pregnancy related conditions, unspecified trimester: Secondary | ICD-10-CM

## 2022-02-25 LAB — URINALYSIS, ROUTINE W REFLEX MICROSCOPIC
Bilirubin Urine: NEGATIVE
Glucose, UA: NEGATIVE mg/dL
Hgb urine dipstick: NEGATIVE
Ketones, ur: 20 mg/dL — AB
Leukocytes,Ua: NEGATIVE
Nitrite: NEGATIVE
Protein, ur: 30 mg/dL — AB
Specific Gravity, Urine: 1.028 (ref 1.005–1.030)
pH: 6 (ref 5.0–8.0)

## 2022-02-25 LAB — COMPREHENSIVE METABOLIC PANEL
ALT: 10 U/L (ref 0–44)
AST: 13 U/L — ABNORMAL LOW (ref 15–41)
Albumin: 2.9 g/dL — ABNORMAL LOW (ref 3.5–5.0)
Alkaline Phosphatase: 55 U/L (ref 38–126)
Anion gap: 7 (ref 5–15)
BUN: <5 mg/dL — ABNORMAL LOW (ref 6–20)
CO2: 24 mmol/L (ref 22–32)
Calcium: 8.9 mg/dL (ref 8.9–10.3)
Chloride: 102 mmol/L (ref 98–111)
Creatinine, Ser: 0.6 mg/dL (ref 0.44–1.00)
GFR, Estimated: >60 mL/min (ref >60)
Glucose, Bld: 95 mg/dL (ref 70–99)
Potassium: 3.4 mmol/L — ABNORMAL LOW (ref 3.5–5.1)
Sodium: 133 mmol/L — ABNORMAL LOW (ref 135–145)
Total Bilirubin: 0.2 mg/dL — ABNORMAL LOW (ref 0.3–1.2)
Total Protein: 6.7 g/dL (ref 6.5–8.1)

## 2022-02-25 LAB — CBC
HCT: 32.2 % — ABNORMAL LOW (ref 36.0–46.0)
Hemoglobin: 10.2 g/dL — ABNORMAL LOW (ref 12.0–15.0)
MCH: 27.3 pg (ref 26.0–34.0)
MCHC: 31.7 g/dL (ref 30.0–36.0)
MCV: 86.1 fL (ref 80.0–100.0)
Platelets: 311 10*3/uL (ref 150–400)
RBC: 3.74 MIL/uL — ABNORMAL LOW (ref 3.87–5.11)
RDW: 16 % — ABNORMAL HIGH (ref 11.5–15.5)
WBC: 12.7 10*3/uL — ABNORMAL HIGH (ref 4.0–10.5)
nRBC: 0 % (ref 0.0–0.2)

## 2022-02-25 LAB — AMYLASE: Amylase: 38 U/L (ref 28–100)

## 2022-02-25 LAB — LIPASE, BLOOD: Lipase: 26 U/L (ref 11–51)

## 2022-02-25 MED ORDER — CYCLOBENZAPRINE HCL 10 MG PO TABS: 10.0000 mg | ORAL_TABLET | Freq: Two times a day (BID) | ORAL | 0 refills | Status: DC | PRN

## 2022-02-25 MED ORDER — ACETAMINOPHEN 500 MG PO TABS
1000.0000 mg | ORAL_TABLET | Freq: Once | ORAL | Status: AC
  Administered 2022-02-25: 1000 mg via ORAL
  Filled 2022-02-25: qty 2

## 2022-02-25 MED ORDER — ALUM & MAG HYDROXIDE-SIMETH 200-200-20 MG/5ML PO SUSP
30.0000 mL | Freq: Once | ORAL | Status: AC
  Administered 2022-02-25: 30 mL via ORAL
  Filled 2022-02-25: qty 30

## 2022-02-25 NOTE — MAU Provider Note (Signed)
History     CSN: [id number]  Arrival date and time: 02/25/22 1739   Event Date/Time   First Provider Initiated Contact with Patient 02/25/22 1840      Chief Complaint  Patient presents with   Abdominal Pain   HPI Miranda Mack is a 27 y.o. G3P1011 at 57w5dwho presents to MAU for upper abdominal pain. Patient reports intermittent sharp upper abdominal pain for 3 days. She reports pain is worse with movement, walking, etc. Sitting still makes pain better. Pain is not associated with eating although has little appetite. She was able to eat 1 piece of banana nut bread around 1300. She denies nausea, vomiting, heartburn. She has tried Tylenol and Gas-x without relief. No vaginal bleeding or discharge. BM's are normal. She denies chest pain or SOB.   [facility]   OB History     Gravida  3   Para  1   Term  1   Preterm  0   AB  1   Living  1      SAB  1   IAB  0   Ectopic  0   Multiple      Live Births  1           Past Medical History:  Diagnosis Date   GERD (gastroesophageal reflux disease)    Miscarriage within last 12 months    Ovarian cyst     Past Surgical History:  Procedure Laterality Date   APPENDECTOMY     CESAREAN SECTION N/A 02/04/2020   Procedure: CESAREAN SECTION;  Surgeon: TJonelle Sidle MD;  [facility];  Service: Obstetrics;  Laterality: N/A;   LAPAROSCOPIC APPENDECTOMY N/A 04/02/2017   Procedure: APPENDECTOMY LAPAROSCOPIC;  Surgeon: GArmandina Gemma MD;  [facility];  Service: General;  Laterality: N/A;    Family History  Problem Relation Age of Onset   Diabetes Father    Hypertension Father    Diabetes Mother     Social History   Tobacco Use   Smoking status: Never   Smokeless tobacco: Never  Vaping Use   Vaping Use: Never used  Substance Use Topics   Alcohol use: Not Currently    Comment: occ   Drug use: Never    Allergies:  Allergies  Allergen  Reactions   Fish Allergy Anaphylaxis   Peanut-Containing Drug Products Anaphylaxis    Medications Prior to Admission  Medication Sig Dispense Refill Last Dose   aspirin 81 MG chewable tablet Chew 81 mg by mouth daily.      Prenatal Vit-Fe Fumarate-FA (PRENATAL MULTIVITAMIN) TABS tablet Take 1 tablet by mouth daily at 12 noon.      prochlorperazine (COMPAZINE) 10 MG tablet Take 1 tablet (10 mg total) by mouth 2 (two) times daily as needed for nausea or vomiting. 10 tablet 0 Past Month   Review of Systems  Constitutional: Negative.   Gastrointestinal:  Positive for abdominal pain.  All other systems reviewed and are negative.  Physical Exam   Blood pressure 115/66, pulse 90, temperature 98.4 F (36.9 C), temperature source Oral, resp. rate 16, height 5' 3"$  (1.6 m), weight 128.8 kg, last menstrual period 11/14/2021, SpO2 99 %, unknown if currently breastfeeding.  Physical Exam Vitals and nursing note reviewed.  Constitutional:      General: She is not in acute distress.    Appearance: She is obese.  Cardiovascular:     Rate and Rhythm: Normal rate.  Pulmonary:     Effort: Pulmonary effort is normal.  Abdominal:     Palpations: Abdomen is soft.     Tenderness: There is abdominal tenderness in the epigastric area.  Skin:    General: Skin is warm and dry.  Neurological:     General: No focal deficit present.     Mental Status: She is alert and oriented to person, place, and time.  Psychiatric:        Mood and Affect: Mood normal.        Behavior: Behavior normal.    FHR: 145 bpm via doppler  US ABDOMEN LIMITED RUQ (LIVER/GB)  Result Date: 02/25/2022 CLINICAL DATA:  Fourteen weeks and 5 days pregnant presenting with upper abdominal pain. EXAM: ULTRASOUND ABDOMEN LIMITED RIGHT UPPER QUADRANT COMPARISON:  November 07, 2012 FINDINGS: Gallbladder: No gallstones or wall thickening visualized (1.7 mm). No sonographic Murphy sign noted by sonographer. Common bile duct: Diameter: 4.2  mm Liver: No focal lesion identified. Within normal limits in parenchymal echogenicity. Portal vein is patent on color Doppler imaging with normal direction of blood flow towards the liver. Other: None. IMPRESSION: Normal right upper quadrant ultrasound. Electronically Signed   By: Virgina Norfolk M.D.   On: 02/25/2022 19:51    Results for orders placed or performed during the hospital encounter of 02/25/22 (from the past 24 hour(s))  Urinalysis, Routine w reflex microscopic -Urine, Clean Catch     Status: Abnormal   Collection Time: 02/25/22  6:39 PM  Result Value Ref Range   Color, Urine YELLOW YELLOW   APPearance HAZY (A) CLEAR   Specific Gravity, Urine 1.028 1.005 - 1.030   pH 6.0 5.0 - 8.0   Glucose, UA NEGATIVE NEGATIVE mg/dL   Hgb urine dipstick NEGATIVE NEGATIVE   Bilirubin Urine NEGATIVE NEGATIVE   Ketones, ur 20 (A) NEGATIVE mg/dL   Protein, ur 30 (A) NEGATIVE mg/dL   Nitrite NEGATIVE NEGATIVE   Leukocytes,Ua NEGATIVE NEGATIVE   RBC / HPF 0-5 0 - 5 RBC/hpf   WBC, UA 0-5 0 - 5 WBC/hpf   Bacteria, UA FEW (A) NONE SEEN   Squamous Epithelial / HPF 0-5 0 - 5 /HPF   Mucus PRESENT   CBC     Status: Abnormal   Collection Time: 02/25/22  7:02 PM  Result Value Ref Range   WBC 12.7 (H) 4.0 - 10.5 K/uL   RBC 3.74 (L) 3.87 - 5.11 MIL/uL   Hemoglobin 10.2 (L) 12.0 - 15.0 g/dL   HCT 32.2 (L) 36.0 - 46.0 %   MCV 86.1 80.0 - 100.0 fL   MCH 27.3 26.0 - 34.0 pg   MCHC 31.7 30.0 - 36.0 g/dL   RDW 16.0 (H) 11.5 - 15.5 %   Platelets 311 150 - 400 K/uL   nRBC 0.0 0.0 - 0.2 %  Comprehensive metabolic panel     Status: Abnormal   Collection Time: 02/25/22  7:02 PM  Result Value Ref Range   Sodium 133 (L) 135 - 145 mmol/L   Potassium 3.4 (L) 3.5 - 5.1 mmol/L   Chloride 102 98 - 111 mmol/L   CO2 24 22 - 32 mmol/L   Glucose, Bld 95 70 - 99 mg/dL   BUN <5 (L) 6 - 20 mg/dL   Creatinine, Ser 0.60 0.44 - 1.00 mg/dL   Calcium 8.9 8.9 - 10.3 mg/dL   Total Protein 6.7 6.5 - 8.1 g/dL   Albumin  2.9 (L) 3.5 - 5.0 g/dL   AST 13 (L)  15 - 41 U/L   ALT 10 0 - 44 U/L   Alkaline Phosphatase 55 38 - 126 U/L   Total Bilirubin 0.2 (L) 0.3 - 1.2 mg/dL   GFR, Estimated >60 >60 mL/min   Anion gap 7 5 - 15  Lipase, blood     Status: None   Collection Time: 02/25/22  7:02 PM  Result Value Ref Range   Lipase 26 11 - 51 U/L  Amylase     Status: None   Collection Time: 02/25/22  7:02 PM  Result Value Ref Range   Amylase 38 28 - 100 U/L    MAU Course  Procedures  MDM UA CBC, CMP, Lipase, Amylase RUQ [place] GI cocktail/Tylenol  Vital signs stable. Labs reassuring. US unremarkable- no evidence of gallstones. GI cocktail and Tylenol given.  2200: Patient reports pain now only with movement. I suspect pain likely MSK- will send patient home with Flexeril. Strict return precautions reviewed.   Assessment and Plan   1. [redacted] weeks gestation of pregnancy   2. Abdominal pain affecting pregnancy    - Discharge home in stable condition - Rx for Flexeril - Strict return precautions - Return to MAU for new/worsening symptoms - Keep OB appointment as scheduled   Renee Harder, CNM 02/25/2022, 10:15 PM

## 2022-02-25 NOTE — Discharge Instructions (Signed)

## 2022-02-25 NOTE — MAU Note (Signed)
Miranda Mack is a 27 y.o. at 76w5dhere in MAU reporting: mid upper abdominal pain for 3 days. Pain is intermittent and occurs with movement. Pain is sharp. Also having lower abdominal pain, this is ongoing for 1-2 weeks. No bleeding or discharge.  Onset of complaint: ongoing  Pain score: mid upper abdominal pain 7/10, lower abdominal pain pain 4/10  Vitals:   02/25/22 1812  BP: 115/66  Pulse: 90  Resp: 16  Temp: 98.4 F (36.9 C)  SpO2: 99%     FHT:145  Lab orders placed from triage: UA

## 2022-02-28 ENCOUNTER — Other Ambulatory Visit: Payer: Self-pay | Admitting: Obstetrics and Gynecology

## 2022-02-28 ENCOUNTER — Other Ambulatory Visit: Payer: Self-pay

## 2022-02-28 DIAGNOSIS — Z363 Encounter for antenatal screening for malformations: Secondary | ICD-10-CM

## 2022-03-15 ENCOUNTER — Encounter (HOSPITAL_COMMUNITY): Payer: Self-pay | Admitting: Obstetrics and Gynecology

## 2022-03-15 ENCOUNTER — Inpatient Hospital Stay (HOSPITAL_COMMUNITY)
Admission: AD | Admit: 2022-03-15 | Discharge: 2022-03-15 | Disposition: A | Discharge status: Home / Self Care | Payer: Medicaid Other | Attending: Obstetrics and Gynecology | Admitting: Obstetrics and Gynecology

## 2022-03-15 DIAGNOSIS — Z3A17 17 weeks gestation of pregnancy: Secondary | ICD-10-CM | POA: Insufficient documentation

## 2022-03-15 DIAGNOSIS — O99612 Diseases of the digestive system complicating pregnancy, second trimester: Secondary | ICD-10-CM | POA: Diagnosis not present

## 2022-03-15 DIAGNOSIS — O219 Vomiting of pregnancy, unspecified: Secondary | ICD-10-CM | POA: Insufficient documentation

## 2022-03-15 DIAGNOSIS — O26892 Other specified pregnancy related conditions, second trimester: Secondary | ICD-10-CM | POA: Diagnosis present

## 2022-03-15 HISTORY — DX: Headache, unspecified: R51.9

## 2022-03-15 HISTORY — DX: Depression, unspecified: F32.A

## 2022-03-15 HISTORY — DX: Anxiety disorder, unspecified: F41.9

## 2022-03-15 HISTORY — DX: Anemia, unspecified: D64.9

## 2022-03-15 LAB — URINALYSIS, ROUTINE W REFLEX MICROSCOPIC
Glucose, UA: NEGATIVE mg/dL
Hgb urine dipstick: NEGATIVE
Ketones, ur: NEGATIVE mg/dL
Leukocytes,Ua: NEGATIVE
Nitrite: NEGATIVE
Protein, ur: 30 mg/dL — AB
Specific Gravity, Urine: 1.027 (ref 1.005–1.030)
pH: 7 (ref 5.0–8.0)

## 2022-03-15 MED ORDER — ONDANSETRON 8 MG PO TBDP: 8.0000 mg | ORAL_TABLET | Freq: Three times a day (TID) | ORAL | 0 refills | Status: DC | PRN

## 2022-03-15 MED ORDER — ONDANSETRON HCL 4 MG/2ML IJ SOLN
4.0000 mg | Freq: Once | INTRAMUSCULAR | Status: AC
  Administered 2022-03-15: 4 mg via INTRAVENOUS
  Filled 2022-03-15: qty 2

## 2022-03-15 MED ORDER — METOCLOPRAMIDE HCL 10 MG PO TABS: 10.0000 mg | ORAL_TABLET | Freq: Four times a day (QID) | ORAL | 0 refills | Status: DC

## 2022-03-15 MED ORDER — SCOPOLAMINE 1 MG/3DAYS TD PT72: 1.0000 | MEDICATED_PATCH | TRANSDERMAL | 12 refills | Status: DC

## 2022-03-15 MED ORDER — METOCLOPRAMIDE HCL 5 MG/ML IJ SOLN
10.0000 mg | Freq: Once | INTRAMUSCULAR | Status: AC
  Administered 2022-03-15: 10 mg via INTRAVENOUS
  Filled 2022-03-15: qty 2

## 2022-03-15 MED ORDER — LACTATED RINGERS IV BOLUS
1000.0000 mL | Freq: Once | INTRAVENOUS | Status: AC
  Administered 2022-03-15: 1000 mL via INTRAVENOUS

## 2022-03-15 MED ORDER — SCOPOLAMINE 1 MG/3DAYS TD PT72
1.0000 | MEDICATED_PATCH | TRANSDERMAL | Status: DC
  Administered 2022-03-15: 1.5 mg via TRANSDERMAL
  Filled 2022-03-15: qty 1

## 2022-03-15 NOTE — MAU Note (Signed)
Miranda Mack is a 27 y.o. at 15w2dhere in MAU reporting: past couple of days has been throwing up every time she eats.  Can't keep down any food or fluids.  Has been dizzy. This morning has been feeling like she wants to pass out.  Called OB, told to come in.  No pain, bleeding. Denies fever or diarrhea. No one else at home is sick.  Issues with this before, but not this severe.  Onset of complaint: couple days ago Pain score: none Vitals:   03/15/22 1358  BP: 123/74  Pulse: 91  Resp: 18  Temp: 99.1 F (37.3 C)  SpO2: 99%     FHT:140 Lab orders placed from triage:  urine

## 2022-03-15 NOTE — Discharge Instructions (Signed)

## 2022-03-15 NOTE — MAU Provider Note (Signed)
History     CSN: [id number]  Arrival date and time: 03/15/22 1341   Event Date/Time   First Provider Initiated Contact with Patient 03/15/22 1426      Chief Complaint  Patient presents with   light headed   Nausea   Emesis   HPI  Miranda Mack is a 27 y.o. G3P1011 at 11w2dwho presents for evaluation of nausea and vomiting. Patient reports since Monday, she has thrown up everything she ate or drank. She has tried zofran and phenergan last night without relief. She denies any pain.  She denies any vaginal bleeding, discharge, and leaking of fluid. Denies any constipation, diarrhea or any urinary complaints.  OB History     Gravida  3   Para  1   Term  1   Preterm  0   AB  1   Living  1      SAB  1   IAB  0   Ectopic  0   Multiple      Live Births  1           Past Medical History:  Diagnosis Date   Anemia    Anxiety    Depression    GERD (gastroesophageal reflux disease)    Headache    Miscarriage within last 12 months    Ovarian cyst     Past Surgical History:  Procedure Laterality Date   APPENDECTOMY     CESAREAN SECTION N/A 02/04/2020   Procedure: CESAREAN SECTION;  Surgeon: TJonelle Sidle MD;  [facility];  Service: Obstetrics;  Laterality: N/A;   LAPAROSCOPIC APPENDECTOMY N/A 04/02/2017   Procedure: APPENDECTOMY LAPAROSCOPIC;  Surgeon: GArmandina Gemma MD;  Location: [facility];  Service: General;  Laterality: N/A;    Family History  Problem Relation Age of Onset   Diabetes Father    Hypertension Father    Diabetes Mother     Social History   Tobacco Use   Smoking status: Never   Smokeless tobacco: Never  Vaping Use   Vaping Use: Never used  Substance Use Topics   Alcohol use: Not Currently    Comment: occ   Drug use: Never    Allergies:  Allergies  Allergen Reactions   Fish Allergy Anaphylaxis   Peanut-Containing Drug Products Anaphylaxis    Medications Prior to Admission  Medication Sig Dispense Refill  Last Dose   aspirin 81 MG chewable tablet Chew 81 mg by mouth daily.   03/14/2022   ondansetron (ZOFRAN) 4 MG tablet Take 4 mg by mouth every 8 (eight) hours as needed for nausea or vomiting.   03/14/2022 at 2200   Prenatal Vit-Fe Fumarate-FA (PRENATAL MULTIVITAMIN) TABS tablet Take 1 tablet by mouth daily at 12 noon.   03/14/2022   promethazine (PHENERGAN) 25 MG tablet Take 25 mg by mouth every 6 (six) hours as needed for nausea or vomiting.   03/14/2022 at 1600   cyclobenzaprine (FLEXERIL) 10 MG tablet Take 1 tablet (10 mg total) by mouth 2 (two) times daily as needed for muscle spasms. 20 tablet 0    prochlorperazine (COMPAZINE) 10 MG tablet Take 1 tablet (10 mg total) by mouth 2 (two) times daily as needed for nausea or vomiting. 10 tablet 0     Review of Systems  Constitutional: Negative.  Negative for fatigue and fever.  HENT: Negative.    Respiratory: Negative.  Negative for shortness of breath.   Cardiovascular: Negative.  Negative for chest pain.  Gastrointestinal:  Positive for nausea and vomiting. Negative for abdominal pain, constipation and diarrhea.  Genitourinary: Negative.  Negative for dysuria, vaginal bleeding and vaginal discharge.  Neurological: Negative.  Negative for dizziness and headaches.   Physical Exam   Blood pressure 123/74, pulse 91, temperature 99.1 F (37.3 C), temperature source Oral, resp. rate 18, height '5\' 3"'$  (1.6 m), weight 125.9 kg, last menstrual period 11/14/2021, SpO2 99 %, unknown if currently breastfeeding.  Patient Vitals for the past 24 hrs:  BP Temp Temp src Pulse Resp SpO2 Height Weight  03/15/22 1358 123/74 99.1 F (37.3 C) Oral 91 18 99 % '5\' 3"'$  (1.6 m) 125.9 kg    Physical Exam Vitals and nursing note reviewed.  Constitutional:      General: She is not in acute distress.    Appearance: She is well-developed.  HENT:     Head: Normocephalic.  Eyes:     Pupils: Pupils are equal, round, and reactive to light.  Cardiovascular:     Rate  and Rhythm: Normal rate and regular rhythm.     Heart sounds: Normal heart sounds.  Pulmonary:     Effort: Pulmonary effort is normal. No respiratory distress.     Breath sounds: Normal breath sounds.  Abdominal:     General: Bowel sounds are normal. There is no distension.     Palpations: Abdomen is soft.     Tenderness: There is no abdominal tenderness.  Skin:    General: Skin is warm and dry.  Neurological:     Mental Status: She is alert and oriented to person, place, and time.  Psychiatric:        Mood and Affect: Mood normal.        Behavior: Behavior normal.        Thought Content: Thought content normal.        Judgment: Judgment normal.    FHT: 140 bpm  MAU Course  Procedures  Results for orders placed or performed during the hospital encounter of 03/15/22 (from the past 24 hour(s))  Urinalysis, Routine w reflex microscopic -Urine, Clean Catch     Status: Abnormal   Collection Time: 03/15/22  2:45 PM  Result Value Ref Range   Color, Urine AMBER (A) YELLOW   APPearance HAZY (A) CLEAR   Specific Gravity, Urine 1.027 1.005 - 1.030   pH 7.0 5.0 - 8.0   Glucose, UA NEGATIVE NEGATIVE mg/dL   Hgb urine dipstick NEGATIVE NEGATIVE   Bilirubin Urine SMALL (A) NEGATIVE   Ketones, ur NEGATIVE NEGATIVE mg/dL   Protein, ur 30 (A) NEGATIVE mg/dL   Nitrite NEGATIVE NEGATIVE   Leukocytes,Ua NEGATIVE NEGATIVE   RBC / HPF 0-5 0 - 5 RBC/hpf   WBC, UA 0-5 0 - 5 WBC/hpf   Bacteria, UA RARE (A) NONE SEEN   Squamous Epithelial / HPF 0-5 0 - 5 /HPF   Mucus PRESENT       MDM Labs ordered and reviewed.   UA LR bolus Zofran Relgan Scop patch  No episodes of vomiting in MAU and reports improvement of nausea  Assessment and Plan   1. Nausea and vomiting in pregnancy   2. [redacted] weeks gestation of pregnancy     -Discharge home in stable condition -Rx for antiemetics sent to pharmacy -Second trimester precautions discussed -Patient advised to follow-up with OB as scheduled for  prenatal care -Patient may return to MAU as needed or if her condition were to change or worsen  Wende Mott, CNM 03/15/2022, 2:26  PM

## 2022-03-27 ENCOUNTER — Ambulatory Visit: Payer: Medicaid Other | Admitting: *Deleted

## 2022-03-27 ENCOUNTER — Ambulatory Visit: Payer: Medicaid Other | Attending: Obstetrics and Gynecology

## 2022-03-27 VITALS — BP 111/64 | HR 88

## 2022-03-27 DIAGNOSIS — O99212 Obesity complicating pregnancy, second trimester: Secondary | ICD-10-CM | POA: Insufficient documentation

## 2022-03-27 DIAGNOSIS — Z363 Encounter for antenatal screening for malformations: Secondary | ICD-10-CM

## 2022-03-27 NOTE — Progress Notes (Signed)
295lb

## 2022-03-28 ENCOUNTER — Other Ambulatory Visit: Payer: Self-pay | Admitting: *Deleted

## 2022-03-28 DIAGNOSIS — O99212 Obesity complicating pregnancy, second trimester: Secondary | ICD-10-CM

## 2022-03-28 DIAGNOSIS — Z362 Encounter for other antenatal screening follow-up: Secondary | ICD-10-CM

## 2022-04-30 ENCOUNTER — Ambulatory Visit: Payer: Medicaid Other | Attending: Obstetrics

## 2022-04-30 ENCOUNTER — Ambulatory Visit: Payer: Medicaid Other | Admitting: *Deleted

## 2022-04-30 ENCOUNTER — Other Ambulatory Visit: Payer: Self-pay | Admitting: *Deleted

## 2022-04-30 VITALS — BP 116/68 | HR 112

## 2022-04-30 DIAGNOSIS — E669 Obesity, unspecified: Secondary | ICD-10-CM

## 2022-04-30 DIAGNOSIS — O99012 Anemia complicating pregnancy, second trimester: Secondary | ICD-10-CM

## 2022-04-30 DIAGNOSIS — O34219 Maternal care for unspecified type scar from previous cesarean delivery: Secondary | ICD-10-CM

## 2022-04-30 DIAGNOSIS — D649 Anemia, unspecified: Secondary | ICD-10-CM

## 2022-04-30 DIAGNOSIS — Z362 Encounter for other antenatal screening follow-up: Secondary | ICD-10-CM | POA: Diagnosis present

## 2022-04-30 DIAGNOSIS — Z3A23 23 weeks gestation of pregnancy: Secondary | ICD-10-CM

## 2022-04-30 DIAGNOSIS — O99212 Obesity complicating pregnancy, second trimester: Secondary | ICD-10-CM

## 2022-04-30 DIAGNOSIS — O99210 Obesity complicating pregnancy, unspecified trimester: Secondary | ICD-10-CM

## 2022-05-29 ENCOUNTER — Encounter: Payer: Self-pay | Admitting: *Deleted

## 2022-05-30 ENCOUNTER — Ambulatory Visit: Payer: Medicaid Other | Attending: Obstetrics and Gynecology

## 2022-05-30 ENCOUNTER — Ambulatory Visit: Payer: Medicaid Other

## 2022-05-30 VITALS — BP 119/64 | HR 105

## 2022-05-30 DIAGNOSIS — Z362 Encounter for other antenatal screening follow-up: Secondary | ICD-10-CM | POA: Insufficient documentation

## 2022-05-30 DIAGNOSIS — O99013 Anemia complicating pregnancy, third trimester: Secondary | ICD-10-CM

## 2022-05-30 DIAGNOSIS — O34219 Maternal care for unspecified type scar from previous cesarean delivery: Secondary | ICD-10-CM

## 2022-05-30 DIAGNOSIS — Z3A28 28 weeks gestation of pregnancy: Secondary | ICD-10-CM

## 2022-05-30 DIAGNOSIS — O99343 Other mental disorders complicating pregnancy, third trimester: Secondary | ICD-10-CM

## 2022-05-30 DIAGNOSIS — O99213 Obesity complicating pregnancy, third trimester: Secondary | ICD-10-CM | POA: Insufficient documentation

## 2022-05-30 DIAGNOSIS — F419 Anxiety disorder, unspecified: Secondary | ICD-10-CM

## 2022-05-30 DIAGNOSIS — E7800 Pure hypercholesterolemia, unspecified: Secondary | ICD-10-CM

## 2022-05-30 DIAGNOSIS — O99210 Obesity complicating pregnancy, unspecified trimester: Secondary | ICD-10-CM | POA: Diagnosis not present

## 2022-05-30 DIAGNOSIS — D649 Anemia, unspecified: Secondary | ICD-10-CM | POA: Diagnosis not present

## 2022-05-30 DIAGNOSIS — E669 Obesity, unspecified: Secondary | ICD-10-CM

## 2022-05-30 DIAGNOSIS — F32A Depression, unspecified: Secondary | ICD-10-CM

## 2022-05-30 DIAGNOSIS — O99283 Endocrine, nutritional and metabolic diseases complicating pregnancy, third trimester: Secondary | ICD-10-CM

## 2022-06-03 ENCOUNTER — Inpatient Hospital Stay (HOSPITAL_COMMUNITY)
Admission: AD | Admit: 2022-06-03 | Discharge: 2022-06-03 | Disposition: A | Discharge status: Home / Self Care | Payer: Medicaid Other | Attending: Obstetrics | Admitting: Obstetrics

## 2022-06-03 ENCOUNTER — Inpatient Hospital Stay (HOSPITAL_COMMUNITY): Payer: Medicaid Other

## 2022-06-03 ENCOUNTER — Encounter (HOSPITAL_COMMUNITY): Payer: Self-pay | Admitting: Obstetrics

## 2022-06-03 DIAGNOSIS — M7918 Myalgia, other site: Secondary | ICD-10-CM | POA: Insufficient documentation

## 2022-06-03 DIAGNOSIS — O26893 Other specified pregnancy related conditions, third trimester: Secondary | ICD-10-CM | POA: Diagnosis present

## 2022-06-03 DIAGNOSIS — Z3A28 28 weeks gestation of pregnancy: Secondary | ICD-10-CM

## 2022-06-03 DIAGNOSIS — M549 Dorsalgia, unspecified: Secondary | ICD-10-CM | POA: Diagnosis not present

## 2022-06-03 LAB — CBC
HCT: 30.1 % — ABNORMAL LOW (ref 36.0–46.0)
Hemoglobin: 9.6 g/dL — ABNORMAL LOW (ref 12.0–15.0)
MCH: 27.3 pg (ref 26.0–34.0)
MCHC: 31.9 g/dL (ref 30.0–36.0)
MCV: 85.5 fL (ref 80.0–100.0)
Platelets: 288 10*3/uL (ref 150–400)
RBC: 3.52 MIL/uL — ABNORMAL LOW (ref 3.87–5.11)
RDW: 14.9 % (ref 11.5–15.5)
WBC: 13.9 10*3/uL — ABNORMAL HIGH (ref 4.0–10.5)
nRBC: 0 % (ref 0.0–0.2)

## 2022-06-03 LAB — URINALYSIS, ROUTINE W REFLEX MICROSCOPIC
Bilirubin Urine: NEGATIVE
Glucose, UA: NEGATIVE mg/dL
Hgb urine dipstick: NEGATIVE
Ketones, ur: 80 mg/dL — AB
Leukocytes,Ua: NEGATIVE
Nitrite: NEGATIVE
Protein, ur: NEGATIVE mg/dL
Specific Gravity, Urine: 1.018 (ref 1.005–1.030)
pH: 7 (ref 5.0–8.0)

## 2022-06-03 MED ORDER — CYCLOBENZAPRINE HCL 10 MG PO TABS: 10.0000 mg | ORAL_TABLET | Freq: Two times a day (BID) | ORAL | 0 refills | Status: DC | PRN

## 2022-06-03 NOTE — MAU Provider Note (Signed)
History     CSN: [id number]  Arrival date and time: 06/03/22 1928   Event Date/Time   First Provider Initiated Contact with Patient 06/03/22 2027      Chief Complaint  Patient presents with   Back Pain   HPI  Miranda Mack is a 27 y.o. G3P1011 at [redacted]w[redacted]d who presents for evaluation of left flank pain. Patient reports she was riding in the car after an appointment and started having pain on the left side. She reports she tried tylenol with no relief. Patient rates the pain as a 8/10 and has tried icy hot as well for the pain. She denies any vaginal bleeding, discharge, and leaking of fluid. Denies any diarrhea or any urinary complaints. She does report feeling constipated but reports a normal bowel movement today. She denies any abdominal pain. Reports normal fetal movement.  OB History     Gravida  3   Para  1   Term  1   Preterm  0   AB  1   Living  1      SAB  1   IAB  0   Ectopic  0   Multiple      Live Births  1           Past Medical History:  Diagnosis Date   [redacted] weeks gestation of pregnancy 02/02/2020   Anemia    Anxiety    Appendicitis 04/01/2017   Depression    GERD (gastroesophageal reflux disease)    Headache    Miscarriage within last 12 months    Ovarian cyst     Past Surgical History:  Procedure Laterality Date   APPENDECTOMY     CESAREAN SECTION N/A 02/04/2020   Procedure: CESAREAN SECTION;  Surgeon: Rande Brunt, MD;  [facility]: [facility];  Service: Obstetrics;  Laterality: N/A;   LAPAROSCOPIC APPENDECTOMY N/A 04/02/2017   Procedure: APPENDECTOMY LAPAROSCOPIC;  Surgeon: Darnell Level, MD;  Location: [facility];  Service: General;  Laterality: N/A;    Family History  Problem Relation Age of Onset   Diabetes Mother    Diabetes Father    Hypertension Father    Cancer Maternal Grandmother    Asthma Neg Hx    Heart disease Neg Hx    Stroke Neg Hx     Social History   Tobacco Use   Smoking status: Never   Smokeless  tobacco: Never  Vaping Use   Vaping Use: Never used  Substance Use Topics   Alcohol use: Not Currently    Comment: occ   Drug use: Never    Allergies:  Allergies  Allergen Reactions   Fish Allergy Anaphylaxis   Peanut-Containing Drug Products Anaphylaxis    Medications Prior to Admission  Medication Sig Dispense Refill Last Dose   aspirin 81 MG chewable tablet Chew 81 mg by mouth daily.   06/03/2022   ferrous sulfate 325 (65 FE) MG tablet Take 325 mg by mouth daily with breakfast.   06/03/2022   ondansetron (ZOFRAN-ODT) 8 MG disintegrating tablet Take 1 tablet (8 mg total) by mouth every 8 (eight) hours as needed for nausea or vomiting. 20 tablet 0 06/02/2022   Prenatal Vit-Fe Fumarate-FA (PRENATAL MULTIVITAMIN) TABS tablet Take 1 tablet by mouth daily at 12 noon.   06/03/2022   metoCLOPramide (REGLAN) 10 MG tablet Take 1 tablet (10 mg total) by mouth every 6 (six) hours. 30 tablet 0     Review of Systems  Constitutional: Negative.  Negative for  fatigue and fever.  HENT: Negative.    Respiratory: Negative.  Negative for shortness of breath.   Cardiovascular: Negative.  Negative for chest pain.  Gastrointestinal: Negative.  Negative for abdominal pain, constipation, diarrhea, nausea and vomiting.  Genitourinary:  Positive for flank pain. Negative for dysuria.  Neurological: Negative.  Negative for dizziness and headaches.   Physical Exam   Blood pressure 122/62, pulse (!) 106, temperature 98.2 F (36.8 C), temperature source Oral, resp. rate 20, height 5\' 3"  (1.6 m), weight 126.7 kg, last menstrual period 11/14/2021, SpO2 97 %, unknown if currently breastfeeding.  Patient Vitals for the past 24 hrs:  BP Temp Temp src Pulse Resp SpO2 Height Weight  06/03/22 1948 122/62 98.2 F (36.8 C) Oral (!) 106 20 97 % 5\' 3"  (1.6 m) 126.7 kg    Physical Exam Vitals and nursing note reviewed.  Constitutional:      General: She is not in acute distress.    Appearance: She is  well-developed.  HENT:     Head: Normocephalic.  Eyes:     Pupils: Pupils are equal, round, and reactive to light.  Cardiovascular:     Rate and Rhythm: Normal rate and regular rhythm.     Heart sounds: Normal heart sounds.  Pulmonary:     Effort: Pulmonary effort is normal. No respiratory distress.     Breath sounds: Normal breath sounds.  Abdominal:     General: Bowel sounds are normal. There is no distension.     Palpations: Abdomen is soft.     Tenderness: There is no abdominal tenderness.  Skin:    General: Skin is warm and dry.  Neurological:     Mental Status: She is alert and oriented to person, place, and time.  Psychiatric:        Mood and Affect: Mood normal.        Behavior: Behavior normal.        Thought Content: Thought content normal.        Judgment: Judgment normal.     Fetal Tracing:  Baseline: 150 Variability: moderate Accels: 10x10 Decels: none  Toco: none   MAU Course  Procedures  Results for orders placed or performed during the hospital encounter of 06/03/22 (from the past 24 hour(s))  Urinalysis, Routine w reflex microscopic -Urine, Clean Catch     Status: Abnormal   Collection Time: 06/03/22  8:02 PM  Result Value Ref Range   Color, Urine YELLOW YELLOW   APPearance CLEAR CLEAR   Specific Gravity, Urine 1.018 1.005 - 1.030   pH 7.0 5.0 - 8.0   Glucose, UA NEGATIVE NEGATIVE mg/dL   Hgb urine dipstick NEGATIVE NEGATIVE   Bilirubin Urine NEGATIVE NEGATIVE   Ketones, ur 80 (A) NEGATIVE mg/dL   Protein, ur NEGATIVE NEGATIVE mg/dL   Nitrite NEGATIVE NEGATIVE   Leukocytes,Ua NEGATIVE NEGATIVE  CBC     Status: Abnormal   Collection Time: 06/03/22  8:53 PM  Result Value Ref Range   WBC 13.9 (H) 4.0 - 10.5 K/uL   RBC 3.52 (L) 3.87 - 5.11 MIL/uL   Hemoglobin 9.6 (L) 12.0 - 15.0 g/dL   HCT 40.9 (L) 81.1 - 91.4 %   MCV 85.5 80.0 - 100.0 fL   MCH 27.3 26.0 - 34.0 pg   MCHC 31.9 30.0 - 36.0 g/dL   RDW 78.2 95.6 - 21.3 %   Platelets 288 150 -  400 K/uL   nRBC 0.0 0.0 - 0.2 %     US  Renal  Result Date: 06/03/2022 CLINICAL DATA:  Left flank pain, [redacted] weeks pregnant EXAM: RENAL / URINARY TRACT ULTRASOUND COMPLETE COMPARISON:  CT abdomen and pelvis 08/23/2020 FINDINGS: Right Kidney: Renal measurements: 10.5 x 4.7 x 5.3 cm = volume: 137 mL. Echogenicity within normal limits. No mass or hydronephrosis visualized. Left Kidney: Renal measurements: 9.0 x 6.1 x 6.2 cm = volume: 179 mL. Echogenicity within normal limits. No mass or hydronephrosis visualized. Bladder: Appears normal for degree of bladder distention. Other: None. IMPRESSION: Normal renal ultrasound. Electronically Signed   By: Minerva Fester M.D.   On: 06/03/2022 21:29     MDM Labs ordered and reviewed.   UA CBC  US Renal  No CVA tenderness, no abdominal tenderness. No ability to reproduce pain. Patient uncomfortable but drove self to hospital so unable to give stronger pain medication in MAU  Offered cervical exam and patient declines stating she is not contracting.   Reviewed negative u/s and limitations in detecting stones. Reviewed gold standard is CT study but does pose radiation exposure in third trimester. Patient declines stating she trusts u/s.  Offered muscle relaxer for home use. Discussed strict return precautions.   Assessment and Plan   1. Musculoskeletal pain   2. [redacted] weeks gestation of pregnancy     -Discharge home in stable condition -Rx for flexeril sent to pharmacy -Third trimester precautions discussed -Patient advised to follow-up with OB as scheduled for prenatal care -Patient may return to MAU as needed or if her condition were to change or worsen  Rolm Bookbinder, CNM 06/03/2022, 8:27 PM

## 2022-06-03 NOTE — Discharge Instructions (Signed)

## 2022-06-03 NOTE — MAU Note (Signed)
.  Miranda Mack is a 27 y.o. at [redacted]w[redacted]d here in MAU reporting: newly onset intermittent left flank pain since 1200 today. Pt reports the pain causes nausea. Pt states she has tried taking Tylenol 1500mg  around 1300, no relief. Pt endorses FM. Pt denies VB, LOF, PIH s/s, dysuria, and complications in the pregnancy.  Onset of complaint: 1200 Pain score: 8/10 Vitals:   06/03/22 1948  BP: 122/62  Pulse: (!) 106  Resp: 20  Temp: 98.2 F (36.8 C)  SpO2: 97%     FHT:140 Lab orders placed from triage:  UA

## 2022-06-17 ENCOUNTER — Non-Acute Institutional Stay (HOSPITAL_COMMUNITY)
Admission: RE | Admit: 2022-06-17 | Discharge: 2022-06-17 | Disposition: A | Discharge status: Home / Self Care | Payer: Medicaid Other | Source: Ambulatory Visit | Attending: Internal Medicine | Admitting: Internal Medicine

## 2022-06-17 DIAGNOSIS — O99019 Anemia complicating pregnancy, unspecified trimester: Secondary | ICD-10-CM | POA: Diagnosis present

## 2022-06-17 MED ORDER — IRON SUCROSE 500 MG IVPB - SIMPLE MED
500.0000 mg | Freq: Once | INTRAVENOUS | Status: AC
  Administered 2022-06-17: 500 mg via INTRAVENOUS
  Filled 2022-06-17: qty 500

## 2022-06-17 MED ORDER — SODIUM CHLORIDE 0.9 % IV SOLN: INTRAVENOUS | Status: DC | PRN

## 2022-06-17 NOTE — Progress Notes (Signed)
PATIENT CARE CENTER NOTE  Diagnosis: ICD-10:099.019, Anemia complicating pregnancy, unspecified trimester   Provider: Derl Barrow MD  Procedure: Venofer 500 mg (#1 of 2)  Note: Patient received Venofer 500 mg infusion via PIV. Pt tolerated infusion with no adverse reaction. RN called [facility] to clarify when pt should return for second dose. Per Dr. Chestine Spore, pt should receive Venofer weekly x 2 doses. AVS printed, and given to patient. Pt advised to schedule next appointment for 1 week at front desk. Pt is alert, oriented, and ambulatory at discharge.

## 2022-06-24 ENCOUNTER — Non-Acute Institutional Stay (HOSPITAL_COMMUNITY)
Admission: RE | Admit: 2022-06-24 | Discharge: 2022-06-24 | Disposition: A | Discharge status: Home / Self Care | Payer: Medicaid Other | Source: Ambulatory Visit | Attending: Internal Medicine | Admitting: Internal Medicine

## 2022-06-24 DIAGNOSIS — Z3A00 Weeks of gestation of pregnancy not specified: Secondary | ICD-10-CM | POA: Insufficient documentation

## 2022-06-24 DIAGNOSIS — O99019 Anemia complicating pregnancy, unspecified trimester: Secondary | ICD-10-CM | POA: Insufficient documentation

## 2022-06-24 MED ORDER — IRON SUCROSE 500 MG IVPB - SIMPLE MED
500.0000 mg | Freq: Once | INTRAVENOUS | Status: AC
  Administered 2022-06-24: 500 mg via INTRAVENOUS
  Filled 2022-06-24: qty 275

## 2022-06-24 MED ORDER — SODIUM CHLORIDE 0.9 % IV SOLN: INTRAVENOUS | Status: DC | PRN

## 2022-06-24 NOTE — Progress Notes (Signed)
PATIENT CARE CENTER NOTE  Diagnosis:ICD-10:099.019, Anemia complicating pregnancy, unspecified trimester    Provider: Derl Barrow MD  Procedure: Venofer 500 mg (#2 of 2)  Note: Patient received Venofer 500 mg infusion via PIV. Pt tolerated infusion well with no adverse reaction. Vital signs stable. AVS offered, but pt declined. Pt is alert, oriented, and ambulatory at discharge.

## 2022-06-25 ENCOUNTER — Ambulatory Visit: Payer: Medicaid Other | Attending: Obstetrics and Gynecology

## 2022-06-25 ENCOUNTER — Ambulatory Visit: Payer: Medicaid Other | Admitting: *Deleted

## 2022-06-25 VITALS — BP 109/64 | HR 107

## 2022-06-25 DIAGNOSIS — O99213 Obesity complicating pregnancy, third trimester: Secondary | ICD-10-CM

## 2022-06-25 DIAGNOSIS — Z3A31 31 weeks gestation of pregnancy: Secondary | ICD-10-CM

## 2022-06-25 DIAGNOSIS — O99210 Obesity complicating pregnancy, unspecified trimester: Secondary | ICD-10-CM | POA: Insufficient documentation

## 2022-06-25 DIAGNOSIS — O99013 Anemia complicating pregnancy, third trimester: Secondary | ICD-10-CM

## 2022-06-25 DIAGNOSIS — D649 Anemia, unspecified: Secondary | ICD-10-CM | POA: Diagnosis not present

## 2022-06-25 DIAGNOSIS — E669 Obesity, unspecified: Secondary | ICD-10-CM | POA: Diagnosis not present

## 2022-06-25 DIAGNOSIS — O34219 Maternal care for unspecified type scar from previous cesarean delivery: Secondary | ICD-10-CM

## 2022-06-26 ENCOUNTER — Other Ambulatory Visit: Payer: Self-pay | Admitting: *Deleted

## 2022-06-26 DIAGNOSIS — O99213 Obesity complicating pregnancy, third trimester: Secondary | ICD-10-CM

## 2022-06-26 DIAGNOSIS — O99013 Anemia complicating pregnancy, third trimester: Secondary | ICD-10-CM

## 2022-06-26 DIAGNOSIS — O34219 Maternal care for unspecified type scar from previous cesarean delivery: Secondary | ICD-10-CM

## 2022-07-15 ENCOUNTER — Encounter (HOSPITAL_COMMUNITY): Payer: Self-pay | Admitting: Obstetrics and Gynecology

## 2022-07-15 ENCOUNTER — Other Ambulatory Visit: Payer: Self-pay

## 2022-07-15 ENCOUNTER — Inpatient Hospital Stay (HOSPITAL_COMMUNITY)
Admission: AD | Admit: 2022-07-15 | Discharge: 2022-07-15 | Disposition: A | Discharge status: Home / Self Care | Payer: Medicaid Other | Attending: Obstetrics and Gynecology | Admitting: Obstetrics and Gynecology

## 2022-07-15 DIAGNOSIS — O26893 Other specified pregnancy related conditions, third trimester: Secondary | ICD-10-CM | POA: Insufficient documentation

## 2022-07-15 DIAGNOSIS — Z3689 Encounter for other specified antenatal screening: Secondary | ICD-10-CM | POA: Diagnosis not present

## 2022-07-15 DIAGNOSIS — Z3A34 34 weeks gestation of pregnancy: Secondary | ICD-10-CM | POA: Diagnosis not present

## 2022-07-15 MED ORDER — ONDANSETRON 4 MG PO TBDP
8.0000 mg | ORAL_TABLET | Freq: Once | ORAL | Status: AC
  Administered 2022-07-15: 8 mg via ORAL
  Filled 2022-07-15: qty 2

## 2022-07-15 NOTE — MAU Note (Signed)
External monitors removed per provider for pt discharge to home.

## 2022-07-15 NOTE — MAU Provider Note (Signed)
History     CSN: [id number]  Arrival date and time: 07/15/22 1606   Event Date/Time   First Provider Initiated Contact with Patient 07/15/22 1753      Chief Complaint  Patient presents with   Fetal Monitoring   HPI Ms. Miranda Mack is a 27 y.o. year old G35P1011 female at [redacted]w[redacted]d weeks gestation who was sent to MAU from [facility] for multiple variable on NST. She reports also having no appetite and some nausea; not a new problem. She denies VB or LOF. She receives [facility] with [facility]; next appt is 07/24/2022. Her spouse is present and contributing to the history taking.    OB History     Gravida  3   Para  1   Term  1   Preterm  0   AB  1   Living  1      SAB  1   IAB  0   Ectopic  0   Multiple      Live Births  1           Past Medical History:  Diagnosis Date   [redacted] weeks gestation of pregnancy 02/02/2020   Anemia    Anxiety    Appendicitis 04/01/2017   Depression    GERD (gastroesophageal reflux disease)    Headache    Miscarriage within last 12 months    Ovarian cyst     Past Surgical History:  Procedure Laterality Date   APPENDECTOMY     CESAREAN SECTION N/A 02/04/2020   Procedure: CESAREAN SECTION;  Surgeon: Rande Brunt, MD;  [facility]: [facility];  Service: Obstetrics;  Laterality: N/A;   LAPAROSCOPIC APPENDECTOMY N/A 04/02/2017   Procedure: APPENDECTOMY LAPAROSCOPIC;  Surgeon: Darnell Level, MD;  Location: [facility];  Service: General;  Laterality: N/A;    Family History  Problem Relation Age of Onset   Diabetes Mother    Diabetes Father    Hypertension Father    Cancer Maternal Grandmother    Asthma Neg Hx    Heart disease Neg Hx    Stroke Neg Hx     Social History   Tobacco Use   Smoking status: Never   Smokeless tobacco: Never  Vaping Use   Vaping Use: Never used  Substance Use Topics   Alcohol use: Not Currently    Comment: occ   Drug use: Never    Allergies:  Allergies  Allergen Reactions   Fish Allergy  Anaphylaxis   Peanut-Containing Drug Products Anaphylaxis    Medications Prior to Admission  Medication Sig Dispense Refill Last Dose   aspirin 81 MG chewable tablet Chew 81 mg by mouth daily.   07/14/2022   ferrous sulfate 325 (65 FE) MG tablet Take 325 mg by mouth daily with breakfast.   07/14/2022   FOLIC ACID PO Take 600 mg by mouth every other day.   07/14/2022   ondansetron (ZOFRAN-ODT) 8 MG disintegrating tablet Take 1 tablet (8 mg total) by mouth every 8 (eight) hours as needed for nausea or vomiting. 20 tablet 0 07/14/2022   cyclobenzaprine (FLEXERIL) 10 MG tablet Take 1 tablet (10 mg total) by mouth 2 (two) times daily as needed for muscle spasms. 20 tablet 0 More than a month   metoCLOPramide (REGLAN) 10 MG tablet Take 1 tablet (10 mg total) by mouth every 6 (six) hours. 30 tablet 0    Prenatal Vit-Fe Fumarate-FA (PRENATAL MULTIVITAMIN) TABS tablet Take 1 tablet by mouth daily at 12 noon.  More than a month    Review of Systems  Constitutional: Negative.   HENT: Negative.    Eyes: Negative.   Respiratory: Negative.    Cardiovascular: Negative.   Gastrointestinal:  Positive for nausea.  Endocrine: Negative.   Allergic/Immunologic: Negative.    Physical Exam   Blood pressure 123/70, pulse 94, temperature 98.7 F (37.1 C), temperature source Oral, resp. rate 19, last menstrual period 11/14/2021, SpO2 99 %, unknown if currently breastfeeding.  Physical Exam Vitals and nursing note reviewed. Exam conducted with a chaperone present.  Constitutional:      Appearance: Normal appearance. She is obese.  Cardiovascular:     Rate and Rhythm: Normal rate.  Pulmonary:     Effort: Pulmonary effort is normal.  Abdominal:     Palpations: Abdomen is soft.  Genitourinary:    General: Normal vulva.     Comments: Dilation: Closed Effacement (%): Thick Cervical Position: Posterior Station: Ballotable Presentation: Undeterminable Exam by:: Carloyn Jaeger, CNM  Musculoskeletal:         General: Normal range of motion.  Skin:    General: Skin is warm and dry.  Neurological:     Mental Status: She is alert and oriented to person, place, and time.  Psychiatric:        Mood and Affect: Mood normal.        Behavior: Behavior normal.        Thought Content: Thought content normal.        Judgment: Judgment normal.    REACTIVE NST - FHR: 135 bpm / moderate variability / accels present / decels absent / TOCO: UI noted  MAU Course  Procedures  MDM CEFM Zofran 8 mg ODT -- no N/V Offered Regular Diet Tray -- patient declined PO Hydration -- pt tolerated well  Assessment and Plan  1. NST (non-stress test) reactive - Discharge patient  2. [redacted] weeks gestation of pregnancy - Information provided on Braxton-Hick contractions and FKC - Advised to keep scheduled appt with [facility] on 07/24/2022 - Patient verbalized an understanding of the plan of care and agrees.   Raelyn Mora, CNM 07/15/2022, 5:54 PM

## 2022-07-15 NOTE — MAU Note (Signed)
Pt reports nausea has improved-clear liquid trial

## 2022-07-15 NOTE — MAU Note (Signed)
.  Miranda Mack is a 27 y.o. at [redacted]w[redacted]d here in MAU reporting: she was sent from [facility] office for fetal monitoring after NST at office today.  Denies VB or LOF.  Endorses +FM. LMP: NA Onset of complaint: today Pain score: 0 Vitals:   07/15/22 1622  BP: 128/74  Pulse: 98  Resp: 19  Temp: 98.7 F (37.1 C)  SpO2: 96%     FHT:155 bpm Lab orders placed from triage:   None

## 2022-07-17 DIAGNOSIS — O34219 Maternal care for unspecified type scar from previous cesarean delivery: Secondary | ICD-10-CM | POA: Insufficient documentation

## 2022-07-17 DIAGNOSIS — O99210 Obesity complicating pregnancy, unspecified trimester: Secondary | ICD-10-CM | POA: Insufficient documentation

## 2022-07-17 DIAGNOSIS — O99019 Anemia complicating pregnancy, unspecified trimester: Secondary | ICD-10-CM | POA: Insufficient documentation

## 2022-07-23 ENCOUNTER — Ambulatory Visit: Payer: Medicaid Other | Attending: Maternal & Fetal Medicine

## 2022-07-23 DIAGNOSIS — D649 Anemia, unspecified: Secondary | ICD-10-CM | POA: Diagnosis not present

## 2022-07-23 DIAGNOSIS — E669 Obesity, unspecified: Secondary | ICD-10-CM

## 2022-07-23 DIAGNOSIS — O99213 Obesity complicating pregnancy, third trimester: Secondary | ICD-10-CM | POA: Diagnosis not present

## 2022-07-23 DIAGNOSIS — O34219 Maternal care for unspecified type scar from previous cesarean delivery: Secondary | ICD-10-CM

## 2022-07-23 DIAGNOSIS — Z3A35 35 weeks gestation of pregnancy: Secondary | ICD-10-CM

## 2022-07-23 DIAGNOSIS — O99013 Anemia complicating pregnancy, third trimester: Secondary | ICD-10-CM

## 2022-07-31 NOTE — Patient Instructions (Signed)
Arpi Diebold  07/31/2022   Your procedure is scheduled on:  08/14/2022  Arrive at 1000 at Entrance C on [address] at [facility]  and [employer]. You are invited to use the FREE valet parking or use the Visitor's parking deck.  Pick up the phone at the desk and dial [phone].  Call this number if you have problems the morning of surgery: [phone]  Remember:   Do not eat food:(After Midnight) Desps de medianoche.  Do not drink clear liquids: (After Midnight) Desps de medianoche.  Take these medicines the morning of surgery with A SIP OF WATER:  none   Do not wear jewelry, make-up or nail polish.  Do not wear lotions, powders, or perfumes. Do not wear deodorant.  Do not shave 48 hours prior to surgery.  Do not bring valuables to the hospital.  [facility] is not   responsible for any belongings or valuables brought to the hospital.  Contacts, dentures or bridgework may not be worn into surgery.  Leave suitcase in the car. After surgery it may be brought to your room.  For patients admitted to the hospital, checkout time is 11:00 AM the day of              discharge.      Please read over the following fact sheets that you were given:     Preparing for Surgery

## 2022-08-02 ENCOUNTER — Encounter (HOSPITAL_COMMUNITY): Payer: Self-pay

## 2022-08-12 ENCOUNTER — Encounter (HOSPITAL_COMMUNITY): Admission: RE | Admit: 2022-08-12 | Payer: Medicaid Other | Source: Ambulatory Visit

## 2022-08-12 VITALS — Ht 63.0 in | Wt 284.0 lb

## 2022-08-12 DIAGNOSIS — Z01812 Encounter for preprocedural laboratory examination: Secondary | ICD-10-CM | POA: Diagnosis not present

## 2022-08-12 DIAGNOSIS — Z3A39 39 weeks gestation of pregnancy: Secondary | ICD-10-CM | POA: Diagnosis not present

## 2022-08-12 DIAGNOSIS — O34219 Maternal care for unspecified type scar from previous cesarean delivery: Secondary | ICD-10-CM

## 2022-08-12 DIAGNOSIS — Z01818 Encounter for other preprocedural examination: Secondary | ICD-10-CM | POA: Diagnosis present

## 2022-08-12 LAB — TYPE AND SCREEN
ABO/RH(D): O POS
Antibody Screen: NEGATIVE

## 2022-08-12 LAB — CBC
HCT: 35.3 % — ABNORMAL LOW (ref 36.0–46.0)
Hemoglobin: 11.7 g/dL — ABNORMAL LOW (ref 12.0–15.0)
MCH: 29.7 pg (ref 26.0–34.0)
MCHC: 33.1 g/dL (ref 30.0–36.0)
MCV: 89.6 fL (ref 80.0–100.0)
Platelets: 249 10*3/uL (ref 150–400)
RBC: 3.94 MIL/uL (ref 3.87–5.11)
RDW: 17.5 % — ABNORMAL HIGH (ref 11.5–15.5)
WBC: 11.2 10*3/uL — ABNORMAL HIGH (ref 4.0–10.5)
nRBC: 0 % (ref 0.0–0.2)

## 2022-08-12 LAB — RPR: RPR Ser Ql: NONREACTIVE

## 2022-08-13 NOTE — Anesthesia Preprocedure Evaluation (Signed)
Anesthesia Evaluation  Patient identified by MRN, date of birth, ID band Patient awake    Reviewed: Allergy & Precautions, NPO status , Patient's Chart, lab work & pertinent test results  History of Anesthesia Complications Negative for: history of anesthetic complications  Airway Mallampati: II  TM Distance: >3 FB Neck ROM: Full    Dental no notable dental hx.    Pulmonary neg pulmonary ROS   Pulmonary exam normal        Cardiovascular negative cardio ROS Normal cardiovascular exam     Neuro/Psych  Headaches  Anxiety Depression       GI/Hepatic Neg liver ROS,GERD  Controlled,,  Endo/Other    Morbid obesity (BMI 54)  Renal/GU negative Renal ROS     Musculoskeletal negative musculoskeletal ROS (+)    Abdominal   Peds  Hematology  (+) Blood dyscrasia (Hgb 11.7, Plt 249k), anemia   Anesthesia Other Findings Day of surgery medications reviewed with patient.  Reproductive/Obstetrics (+) Pregnancy (Hx of C/S x1)                              Anesthesia Physical Anesthesia Plan  ASA: 3  Anesthesia Plan: Spinal   Post-op Pain Management: Ofirmev IV (intra-op)*   Induction:   PONV Risk Score and Plan: 4 or greater and Treatment may vary due to age or medical condition, Ondansetron and Dexamethasone  Airway Management Planned: Natural Airway  Additional Equipment: None  Intra-op Plan:   Post-operative Plan:   Informed Consent: I have reviewed the patients History and Physical, chart, labs and discussed the procedure including the risks, benefits and alternatives for the proposed anesthesia with the patient or authorized representative who has indicated his/her understanding and acceptance.       Plan Discussed with: CRNA  Anesthesia Plan Comments:         Anesthesia Quick Evaluation

## 2022-08-14 ENCOUNTER — Inpatient Hospital Stay (HOSPITAL_COMMUNITY): Payer: Medicaid Other | Admitting: Anesthesiology

## 2022-08-14 ENCOUNTER — Other Ambulatory Visit: Payer: Self-pay

## 2022-08-14 ENCOUNTER — Encounter (HOSPITAL_COMMUNITY)
Admission: RE | Disposition: A | Discharge status: Home / Self Care | Payer: Self-pay | Source: Home / Self Care | Attending: Obstetrics and Gynecology

## 2022-08-14 ENCOUNTER — Inpatient Hospital Stay (HOSPITAL_COMMUNITY)
Admission: RE | Admit: 2022-08-14 | Discharge: 2022-08-16 | DRG: 788 | Disposition: A | Discharge status: Home / Self Care | Payer: Medicaid Other | Attending: Obstetrics and Gynecology | Admitting: Obstetrics and Gynecology

## 2022-08-14 ENCOUNTER — Encounter (HOSPITAL_COMMUNITY): Payer: Self-pay | Admitting: Obstetrics and Gynecology

## 2022-08-14 DIAGNOSIS — O99214 Obesity complicating childbirth: Secondary | ICD-10-CM | POA: Diagnosis present

## 2022-08-14 DIAGNOSIS — O9902 Anemia complicating childbirth: Secondary | ICD-10-CM | POA: Diagnosis present

## 2022-08-14 DIAGNOSIS — D509 Iron deficiency anemia, unspecified: Secondary | ICD-10-CM | POA: Diagnosis present

## 2022-08-14 DIAGNOSIS — O34211 Maternal care for low transverse scar from previous cesarean delivery: Secondary | ICD-10-CM

## 2022-08-14 DIAGNOSIS — Z3A39 39 weeks gestation of pregnancy: Secondary | ICD-10-CM

## 2022-08-14 DIAGNOSIS — Z3A00 Weeks of gestation of pregnancy not specified: Secondary | ICD-10-CM | POA: Diagnosis not present

## 2022-08-14 DIAGNOSIS — Z3490 Encounter for supervision of normal pregnancy, unspecified, unspecified trimester: Secondary | ICD-10-CM

## 2022-08-14 SURGERY — Surgical Case
Anesthesia: Spinal

## 2022-08-14 MED ORDER — KETOROLAC TROMETHAMINE 30 MG/ML IJ SOLN: 30.0000 mg | Freq: Four times a day (QID) | INTRAMUSCULAR | Status: AC | PRN

## 2022-08-14 MED ORDER — MORPHINE SULFATE (PF) 0.5 MG/ML IJ SOLN
INTRAMUSCULAR | Status: AC
  Filled 2022-08-14: qty 10

## 2022-08-14 MED ORDER — OXYTOCIN-SODIUM CHLORIDE 30-0.9 UT/500ML-% IV SOLN
INTRAVENOUS | Status: DC | PRN
  Administered 2022-08-14 (×2): 30 [IU] via INTRAVENOUS

## 2022-08-14 MED ORDER — KETOROLAC TROMETHAMINE 30 MG/ML IJ SOLN
30.0000 mg | Freq: Once | INTRAMUSCULAR | Status: AC
  Administered 2022-08-14: 30 mg via INTRAVENOUS

## 2022-08-14 MED ORDER — SOD CITRATE-CITRIC ACID 500-334 MG/5ML PO SOLN
ORAL | Status: AC
  Filled 2022-08-14: qty 30

## 2022-08-14 MED ORDER — OXYTOCIN-SODIUM CHLORIDE 30-0.9 UT/500ML-% IV SOLN
2.5000 [IU]/h | INTRAVENOUS | Status: AC
  Administered 2022-08-14: 2.5 [IU]/h via INTRAVENOUS
  Filled 2022-08-14: qty 500

## 2022-08-14 MED ORDER — ONDANSETRON HCL 4 MG/2ML IJ SOLN: 4.0000 mg | Freq: Three times a day (TID) | INTRAMUSCULAR | Status: DC | PRN

## 2022-08-14 MED ORDER — NALOXONE HCL 0.4 MG/ML IJ SOLN: 0.4000 mg | INTRAMUSCULAR | Status: DC | PRN

## 2022-08-14 MED ORDER — FENTANYL CITRATE (PF) 100 MCG/2ML IJ SOLN
INTRAMUSCULAR | Status: AC
  Filled 2022-08-14: qty 2

## 2022-08-14 MED ORDER — ACETAMINOPHEN 500 MG PO TABS
1000.0000 mg | ORAL_TABLET | Freq: Four times a day (QID) | ORAL | Status: AC
  Administered 2022-08-14 – 2022-08-15 (×4): 1000 mg via ORAL
  Filled 2022-08-14 (×4): qty 2

## 2022-08-14 MED ORDER — DEXAMETHASONE SODIUM PHOSPHATE 10 MG/ML IJ SOLN
INTRAMUSCULAR | Status: DC | PRN
  Administered 2022-08-14: 10 mg via INTRAVENOUS

## 2022-08-14 MED ORDER — ACETAMINOPHEN 325 MG PO TABS: 650.0000 mg | ORAL_TABLET | ORAL | Status: DC | PRN

## 2022-08-14 MED ORDER — SIMETHICONE 80 MG PO CHEW
80.0000 mg | CHEWABLE_TABLET | Freq: Three times a day (TID) | ORAL | Status: DC
  Administered 2022-08-14 – 2022-08-16 (×6): 80 mg via ORAL
  Filled 2022-08-14 (×6): qty 1

## 2022-08-14 MED ORDER — BUPIVACAINE IN DEXTROSE 0.75-8.25 % IT SOLN
INTRATHECAL | Status: DC | PRN
  Administered 2022-08-14: 1.6 mL via INTRATHECAL

## 2022-08-14 MED ORDER — ZOLPIDEM TARTRATE 5 MG PO TABS: 5.0000 mg | ORAL_TABLET | Freq: Every evening | ORAL | Status: DC | PRN

## 2022-08-14 MED ORDER — CEFAZOLIN IN SODIUM CHLORIDE 3-0.9 GM/100ML-% IV SOLN
3.0000 g | INTRAVENOUS | Status: AC
  Administered 2022-08-14: 3 g via INTRAVENOUS

## 2022-08-14 MED ORDER — DIPHENHYDRAMINE HCL 25 MG PO CAPS: 25.0000 mg | ORAL_CAPSULE | Freq: Four times a day (QID) | ORAL | Status: DC | PRN

## 2022-08-14 MED ORDER — SIMETHICONE 80 MG PO CHEW: 80.0000 mg | CHEWABLE_TABLET | ORAL | Status: DC | PRN

## 2022-08-14 MED ORDER — ACETAMINOPHEN 10 MG/ML IV SOLN
INTRAVENOUS | Status: DC | PRN
  Administered 2022-08-14: 1000 mg via INTRAVENOUS

## 2022-08-14 MED ORDER — FENTANYL CITRATE (PF) 100 MCG/2ML IJ SOLN: 25.0000 ug | INTRAMUSCULAR | Status: DC | PRN

## 2022-08-14 MED ORDER — CEFAZOLIN IN SODIUM CHLORIDE 3-0.9 GM/100ML-% IV SOLN
INTRAVENOUS | Status: AC
  Filled 2022-08-14: qty 100

## 2022-08-14 MED ORDER — MORPHINE SULFATE (PF) 0.5 MG/ML IJ SOLN
INTRAMUSCULAR | Status: DC | PRN
  Administered 2022-08-14: 150 ug via INTRATHECAL

## 2022-08-14 MED ORDER — COCONUT OIL OIL: 1.0000 | TOPICAL_OIL | Status: DC | PRN

## 2022-08-14 MED ORDER — OXYCODONE HCL 5 MG PO TABS
5.0000 mg | ORAL_TABLET | ORAL | Status: DC | PRN
  Administered 2022-08-14 – 2022-08-16 (×6): 5 mg via ORAL
  Filled 2022-08-14: qty 1
  Filled 2022-08-14: qty 2
  Filled 2022-08-14 (×4): qty 1

## 2022-08-14 MED ORDER — SOD CITRATE-CITRIC ACID 500-334 MG/5ML PO SOLN
30.0000 mL | ORAL | Status: AC
  Administered 2022-08-14: 30 mL via ORAL

## 2022-08-14 MED ORDER — SODIUM CHLORIDE 0.9% FLUSH: 3.0000 mL | INTRAVENOUS | Status: DC | PRN

## 2022-08-14 MED ORDER — PHENYLEPHRINE 80 MCG/ML (10ML) SYRINGE FOR IV PUSH (FOR BLOOD PRESSURE SUPPORT)
PREFILLED_SYRINGE | INTRAVENOUS | Status: DC | PRN
  Administered 2022-08-14: 160 ug via INTRAVENOUS

## 2022-08-14 MED ORDER — MENTHOL 3 MG MT LOZG: 1.0000 | LOZENGE | OROMUCOSAL | Status: DC | PRN

## 2022-08-14 MED ORDER — SENNOSIDES-DOCUSATE SODIUM 8.6-50 MG PO TABS
2.0000 | ORAL_TABLET | Freq: Every day | ORAL | Status: DC
  Administered 2022-08-15 – 2022-08-16 (×2): 2 via ORAL
  Filled 2022-08-14 (×2): qty 2

## 2022-08-14 MED ORDER — DIPHENHYDRAMINE HCL 50 MG/ML IJ SOLN
12.5000 mg | INTRAMUSCULAR | Status: DC | PRN
  Administered 2022-08-14: 12.5 mg via INTRAVENOUS
  Filled 2022-08-14: qty 1

## 2022-08-14 MED ORDER — TRANEXAMIC ACID-NACL 1000-0.7 MG/100ML-% IV SOLN
INTRAVENOUS | Status: DC | PRN
  Administered 2022-08-14: 1000 mg via INTRAVENOUS

## 2022-08-14 MED ORDER — DROPERIDOL 2.5 MG/ML IJ SOLN: 0.6250 mg | Freq: Once | INTRAMUSCULAR | Status: DC | PRN

## 2022-08-14 MED ORDER — DIBUCAINE (PERIANAL) 1 % EX OINT: 1.0000 | TOPICAL_OINTMENT | CUTANEOUS | Status: DC | PRN

## 2022-08-14 MED ORDER — DIPHENHYDRAMINE HCL 25 MG PO CAPS: 25.0000 mg | ORAL_CAPSULE | ORAL | Status: DC | PRN

## 2022-08-14 MED ORDER — PHENYLEPHRINE HCL-NACL 20-0.9 MG/250ML-% IV SOLN
INTRAVENOUS | Status: DC | PRN
  Administered 2022-08-14: 60 ug/min via INTRAVENOUS

## 2022-08-14 MED ORDER — ENOXAPARIN SODIUM 80 MG/0.8ML IJ SOSY
70.0000 mg | PREFILLED_SYRINGE | INTRAMUSCULAR | Status: DC
  Administered 2022-08-15 – 2022-08-16 (×2): 70 mg via SUBCUTANEOUS
  Filled 2022-08-14 (×2): qty 0.8

## 2022-08-14 MED ORDER — SODIUM CHLORIDE 0.9 % IV SOLN
500.0000 mg | INTRAVENOUS | Status: DC
  Filled 2022-08-14: qty 5

## 2022-08-14 MED ORDER — NALOXONE HCL 4 MG/10ML IJ SOLN: 1.0000 ug/kg/h | INTRAVENOUS | Status: DC | PRN

## 2022-08-14 MED ORDER — DEXAMETHASONE SODIUM PHOSPHATE 10 MG/ML IJ SOLN
INTRAMUSCULAR | Status: AC
  Filled 2022-08-14: qty 1

## 2022-08-14 MED ORDER — WITCH HAZEL-GLYCERIN EX PADS: 1.0000 | MEDICATED_PAD | CUTANEOUS | Status: DC | PRN

## 2022-08-14 MED ORDER — LACTATED RINGERS IV SOLN: INTRAVENOUS | Status: DC

## 2022-08-14 MED ORDER — KETOROLAC TROMETHAMINE 30 MG/ML IJ SOLN
INTRAMUSCULAR | Status: AC
  Filled 2022-08-14: qty 1

## 2022-08-14 MED ORDER — FENTANYL CITRATE (PF) 100 MCG/2ML IJ SOLN
INTRAMUSCULAR | Status: DC | PRN
  Administered 2022-08-14: 15 ug via INTRATHECAL

## 2022-08-14 MED ORDER — ONDANSETRON HCL 4 MG/2ML IJ SOLN
INTRAMUSCULAR | Status: AC
  Filled 2022-08-14: qty 4

## 2022-08-14 MED ORDER — IBUPROFEN 600 MG PO TABS
600.0000 mg | ORAL_TABLET | Freq: Four times a day (QID) | ORAL | Status: DC
  Administered 2022-08-14 – 2022-08-16 (×7): 600 mg via ORAL
  Filled 2022-08-14 (×7): qty 1

## 2022-08-14 MED ORDER — ONDANSETRON HCL 4 MG/2ML IJ SOLN
INTRAMUSCULAR | Status: DC | PRN
  Administered 2022-08-14: 4 mg via INTRAVENOUS

## 2022-08-14 MED ORDER — LACTATED RINGERS IV SOLN
INTRAVENOUS | Status: DC | PRN
Start: 2022-08-14 — End: 2022-08-14

## 2022-08-14 SURGICAL SUPPLY — 37 items
APL PRP STRL LF DISP 70% ISPRP (MISCELLANEOUS) ×2
APL SKNCLS STERI-STRIP NONHPOA (GAUZE/BANDAGES/DRESSINGS) ×1
BENZOIN TINCTURE PRP APPL 2/3 (GAUZE/BANDAGES/DRESSINGS) IMPLANT
CHLORAPREP W/TINT 26 (MISCELLANEOUS) ×2 IMPLANT
CLAMP UMBILICAL CORD (MISCELLANEOUS) ×1 IMPLANT
CLIP FILSHIE TUBAL LIGA STRL (Clip) IMPLANT
CLOTH BEACON ORANGE TIMEOUT ST (SAFETY) ×1 IMPLANT
DRSG OPSITE POSTOP 4X10 (GAUZE/BANDAGES/DRESSINGS) ×1 IMPLANT
ELECT REM PT RETURN 9FT ADLT (ELECTROSURGICAL) ×1
ELECTRODE REM PT RTRN 9FT ADLT (ELECTROSURGICAL) ×1 IMPLANT
EXTRACTOR VACUUM M CUP 4 TUBE (SUCTIONS) IMPLANT
GAUZE SPONGE 4X4 12PLY STRL LF (GAUZE/BANDAGES/DRESSINGS) IMPLANT
GLOVE BIOGEL M 6.5 STRL (GLOVE) ×1 IMPLANT
GLOVE BIOGEL PI IND STRL 6.5 (GLOVE) ×1 IMPLANT
GLOVE BIOGEL PI IND STRL 7.0 (GLOVE) ×2 IMPLANT
GOWN STRL REUS W/TWL LRG LVL3 (GOWN DISPOSABLE) ×2 IMPLANT
KIT ABG SYR 3ML LUER SLIP (SYRINGE) IMPLANT
MAT PREVALON FULL STRYKER (MISCELLANEOUS) IMPLANT
NDL HYPO 25X5/8 SAFETYGLIDE (NEEDLE) IMPLANT
NEEDLE HYPO 25X5/8 SAFETYGLIDE (NEEDLE) IMPLANT
NS IRRIG 1000ML POUR BTL (IV SOLUTION) ×1 IMPLANT
PACK C SECTION WH (CUSTOM PROCEDURE TRAY) ×1 IMPLANT
PAD ABD 7.5X8 STRL (GAUZE/BANDAGES/DRESSINGS) IMPLANT
PAD OB MATERNITY 4.3X12.25 (PERSONAL CARE ITEMS) ×1 IMPLANT
RTRCTR C-SECT PINK 25CM LRG (MISCELLANEOUS) ×1 IMPLANT
STRIP CLOSURE SKIN 1/2X4 (GAUZE/BANDAGES/DRESSINGS) IMPLANT
SUT MON AB 2-0 CT1 27 (SUTURE) ×1 IMPLANT
SUT PDS AB 0 CTX 60 (SUTURE) IMPLANT
SUT PLAIN 2 0 XLH (SUTURE) IMPLANT
SUT VIC AB 0 CTX 36 (SUTURE) ×4
SUT VIC AB 0 CTX36XBRD ANBCTRL (SUTURE) ×4 IMPLANT
SUT VIC AB 3-0 SH 27 (SUTURE) ×1
SUT VIC AB 3-0 SH 27XBRD (SUTURE) IMPLANT
SUT VIC AB 4-0 KS 27 (SUTURE) ×1 IMPLANT
TOWEL OR 17X24 6PK STRL BLUE (TOWEL DISPOSABLE) ×1 IMPLANT
TRAY FOLEY W/BAG SLVR 14FR LF (SET/KITS/TRAYS/PACK) ×1 IMPLANT
WATER STERILE IRR 1000ML POUR (IV SOLUTION) ×1 IMPLANT

## 2022-08-14 NOTE — H&P (Signed)
27 y.o. [redacted]w[redacted]d  G3P1011 comes in for schedule repeat C/S.  Otherwise has good fetal movement and no bleeding.  Past Medical History:  Diagnosis Date   [redacted] weeks gestation of pregnancy 02/02/2020   Anemia    Anxiety    Appendicitis 04/01/2017   Depression    GERD (gastroesophageal reflux disease)    Headache    Miscarriage within last 12 months    Ovarian cyst     BMI >50  Past Surgical History:  Procedure Laterality Date   APPENDECTOMY     CESAREAN SECTION N/A 02/04/2020   Procedure: CESAREAN SECTION;  Surgeon: Rande Brunt, MD;  Location: [facility];  Service: Obstetrics;  Laterality: N/A;   LAPAROSCOPIC APPENDECTOMY N/A 04/02/2017   Procedure: APPENDECTOMY LAPAROSCOPIC;  Surgeon: Darnell Level, MD;  Location: [facility] OR[facility];  Service: General;  Laterality: N/A;    OB History  Gravida Para Term Preterm AB Living  3 1 1  0 1 1  SAB IAB Ectopic Multiple Live Births  1 0 0   1    # Outcome Date GA Lbr Len/2nd Weight Sex Type Anes PTL Lv  3 Current           2 Term 02/04/20 [redacted]w[redacted]d  3495 g F CS-LTranv EPI  LIV     Birth Comments: WNL  1 SAB             Social History   Socioeconomic History   Marital status: Single    Spouse name: Not on file   Number of children: Not on file   Years of education: Not on file   Highest education level: Not on file  Occupational History   Not on file  Tobacco Use   Smoking status: Never   Smokeless tobacco: Never  Vaping Use   Vaping status: Never Used  Substance and Sexual Activity   Alcohol use: Not Currently    Comment: occ   Drug use: Never   Sexual activity: Yes    Birth control/protection: None  Other Topics Concern   Not on file  Social History Narrative   ** Merged History Encounter **       Social Determinants of Health   Financial Resource Strain: Not on file  Food Insecurity: Not on file  Transportation Needs: Not on file  Physical Activity: Not on file  Stress: Not on file  Social Connections: Unknown (05/28/2021)    Received from [facility]   Social Network    Social Network: Not on file  Intimate Partner Violence: Unknown (04/19/2021)   Received from [facility]   HITS    Physically Hurt: Not on file    Insult or Talk Down To: Not on file    Threaten Physical Harm: Not on file    Scream or Curse: Not on file   Fish allergy and Peanut-containing drug products    Prenatal Transfer Tool  Maternal Diabetes: No Genetic Screening: Normal Maternal Ultrasounds/Referrals: Normal Fetal Ultrasounds or other Referrals:  Referred to Materal Fetal Medicine  Maternal Substance Abuse:  No Significant Maternal Medications:  None Significant Maternal Lab Results: Group B Strep negative  Other PNC: BMI >50, passed early and regular 1 hr    Vitals:   08/14/22 1026 08/14/22 1045  BP: 110/64   Pulse: 99   Resp: 18   Temp: 98 F (36.7 C)   TempSrc: Oral   SpO2: 95%   Weight:  129.9 kg  Height:  5\' 1"  (1.549 m)  Lungs/Cor:  NAD Abdomen:  soft, gravid Ex:  no cords, erythema FHT: present by doppler  A/P   Admit for scheduled repeat C/s at term  Ancef 3g  GBS neg  Lovenox post op  Other routine care  Philip Aspen

## 2022-08-14 NOTE — Anesthesia Procedure Notes (Signed)
Spinal  Patient location during procedure: OR Start time: 08/14/2022 12:08 PM End time: 08/14/2022 12:11 PM Reason for block: surgical anesthesia Staffing Performed: anesthesiologist  Anesthesiologist: Kaylyn Layer, MD Performed by: Kaylyn Layer, MD Authorized by: Kaylyn Layer, MD   Preanesthetic Checklist Completed: patient identified, IV checked, risks and benefits discussed, monitors and equipment checked, pre-op evaluation and timeout performed Spinal Block Patient position: sitting Prep: DuraPrep and site prepped and draped Patient monitoring: heart rate, continuous pulse ox and blood pressure Approach: midline Location: L3-4 Injection technique: single-shot Needle Needle type: Pencan  Needle gauge: 24 G Needle length: 10 cm Assessment Sensory level: T4 Events: CSF return Additional Notes Risks, benefits, and alternative discussed. Patient gave consent to procedure. Prepped and draped in sitting position. Clear CSF obtained after one needle pass. Positive terminal aspiration. No pain or paraesthesias with injection. Patient tolerated procedure well. Vital signs stable. Amalia Greenhouse, MD

## 2022-08-14 NOTE — Transfer of Care (Signed)
Immediate Anesthesia Transfer of Care Note  Patient: Miranda Mack  Procedure(s) Performed: CESAREAN SECTION  Patient Location: PACU  Anesthesia Type:Spinal  Level of Consciousness: awake and patient cooperative  Airway & Oxygen Therapy: Patient Spontanous Breathing  Post-op Assessment: Report given to RN and Post -op Vital signs reviewed and stable  Post vital signs: Reviewed and stable  Last Vitals:  Vitals Value Taken Time  BP 112/64 08/14/22 1346  Temp    Pulse 57 08/14/22 1350  Resp 24 08/14/22 1350  SpO2 93 % 08/14/22 1350  Vitals shown include unfiled device data.  Last Pain:  Vitals:   08/14/22 1026  TempSrc: Oral         Complications: No notable events documented.

## 2022-08-14 NOTE — Op Note (Addendum)
08/14/2022  1:30 PM  PATIENT:  Miranda Mack  27 y.o. female  PRE-OPERATIVE DIAGNOSIS:  repeat cesarean section, elective, declined trial of labor  POST-OPERATIVE DIAGNOSIS:  same  PROCEDURE:  Procedure(s): CESAREAN SECTION (N/A)- low transverse  SURGEON:  Surgeons and Role:    Philip Aspen, DO - Primary    * Carrington Clamp, MD - Assisting  An experienced assistant was required given the standard of surgical care and complexity of the case The assistant was used for exposure, dissection, suctioning, retractions, tissue manipulation, instrument exchange, fundal pressure and overall help during the case.   ANESTHESIA:   spinal  EBL:  682 mL   FINDINGS: female infant, cephalic presentation, APGARS 9/9, wt 8#14, normal tubes and ovaries bilaterally.  Scar tissue present and described below.  BLOOD ADMINISTERED:none  SPECIMEN:  Source of Specimen:  cord blood  DISPOSITION OF SPECIMEN:  N/A  COUNTS:  YES  PLAN OF CARE: Admit to inpatient   PATIENT DISPOSITION:  PACU - hemodynamically stable.   Delay start of Pharmacological VTE agent (>24hrs) due to surgical blood loss or risk of bleeding: not applicable  DESCRIPTION OF PROCEDURE: The patient was taken to the operating room and placed in the  dorsosupine position with left tilt. Anesthesia was found to be adaquate.  Abdomen was then prepped and draped in the usual sterile fashion, a foley catheter was inserted. The level of her anesthesia was found to be adequate.  A Pfannenstiel incision was made and carried down to underlying fascia with bovie cautery.  The fascia was incised at the midline and extended laterally with mayo scissors.  Coker clamps were placed at the superior aspect of the fascial incision and rectus muscles were dissected off bluntly and sharply.  Kocher clamps were then placed at the inferior aspect of the fascial incision and rectus muscles were again dissected off bluntly and sharply.  The peritoneum  was entered bluntly with good visualization. Once the peritoneal cavity was entered the abdomen and pelvis were manually surveyed , abundant adhesions were present tacking bladder over lower uterine segment; these were taken down bluntly, with additional band of thicker scar tissue from superior anterior uterus to the anterior abdominal wall.  This was was transected with bovie cautery and hemostatic. and the Alexis self-retaining retractor was placed and good visualization was achieved.  The lower uterine segment was very thin, essentially creating a window.  A low transverse incision was then made in the lower uterine segment. Once the uterine cavity was entered the incision was extended with cephalic and caudal traction, clear fluid was noted. The fetal vertex was grasped and delivered through the incision atraumatically. Mouth and nares were suctioned. The remainder of the infant then delivered atraumatically. Cord was doubly clamped and cut after one minute and the infant handed to the awaiting pediatric team. Cord blood was obtained. The placenta delivered with gentle cord traction and external massage of the uterus. Uterus was cleared of all clots and debris. Uterine incision was closed in 2 layers, the first 0 vicryl running and locked followed by a second layer of vertical imbrication and 3 additional figures of 8 to the right of midline. Tubes and ovaries were inspected and found to be normal. Uterine incision was inspected and found to be hemostatic.  A defect in the serosa where the thicker band of scar tissue was removed was repaied with 3-0 vicryl and hemostatic.  The Alexis retractor was removed. Peritoneum was closed with 3-0 monocryl.  Fascia was  closed in running fashion with 0 Vicryl. Subcutaneous tissue was then irrigated and made hemostatic with electrocautery.  Subcutaneous tissue was reapproximated and closed with 3 figures of 8.  Skin was closed with running 4-0 Vicryl subcuticular suture  followed by steri-strips and a sterile dressing. Patient tolerated the procedure well and was taken to the recovery in stable condition. Counts were correct x2,

## 2022-08-14 NOTE — Progress Notes (Signed)
Patient in increased pain.  OB Anesthesiologist, Dr Stephannie Peters notified.  Order released for oxycodone 5-10mg  every 4hrs, PRN given to RN.

## 2022-08-14 NOTE — Anesthesia Postprocedure Evaluation (Signed)
Anesthesia Post Note  Patient: Miranda Mack  Procedure(s) Performed: CESAREAN SECTION     Patient location during evaluation: PACU Anesthesia Type: Spinal Level of consciousness: awake and alert Pain management: pain level controlled Vital Signs Assessment: post-procedure vital signs reviewed and stable Respiratory status: spontaneous breathing, nonlabored ventilation and respiratory function stable Cardiovascular status: blood pressure returned to baseline Postop Assessment: no apparent nausea or vomiting, spinal receding, no headache and no backache Anesthetic complications: no   No notable events documented.  Last Vitals:  Vitals:   08/14/22 1445 08/14/22 1503  BP: 120/70 (!) 114/52  Pulse: 61 62  Resp: 13 15  Temp:  36.5 C  SpO2:  97%    Last Pain:  Vitals:   08/14/22 1503  TempSrc: Oral  PainSc:    Pain Goal:    LLE Motor Response: Purposeful movement (08/14/22 1445) LLE Sensation: Decreased (08/14/22 1445) RLE Motor Response: Purposeful movement (08/14/22 1445) RLE Sensation: Decreased (08/14/22 1445)     Epidural/Spinal Function Cutaneous sensation: Vague (08/14/22 1445), Patient able to flex knees: Yes (08/14/22 1445), Patient able to lift hips off bed: Yes (08/14/22 1445), Back pain beyond tenderness at insertion site: No (08/14/22 1445), Progressively worsening motor and/or sensory loss: No (08/14/22 1445), Bowel and/or bladder incontinence post epidural: No (08/14/22 1445)  Shanda Howells

## 2022-08-15 LAB — CBC
HCT: 32.2 % — ABNORMAL LOW (ref 36.0–46.0)
Hemoglobin: 10.7 g/dL — ABNORMAL LOW (ref 12.0–15.0)
MCH: 29.8 pg (ref 26.0–34.0)
MCHC: 33.2 g/dL (ref 30.0–36.0)
MCV: 89.7 fL (ref 80.0–100.0)
Platelets: 224 10*3/uL (ref 150–400)
RBC: 3.59 MIL/uL — ABNORMAL LOW (ref 3.87–5.11)
RDW: 17.2 % — ABNORMAL HIGH (ref 11.5–15.5)
WBC: 19.3 10*3/uL — ABNORMAL HIGH (ref 4.0–10.5)
nRBC: 0 % (ref 0.0–0.2)

## 2022-08-15 LAB — CREATININE, SERUM
Creatinine, Ser: 0.6 mg/dL (ref 0.44–1.00)
GFR, Estimated: >60 mL/min (ref >60)

## 2022-08-15 MED ORDER — GABAPENTIN 100 MG PO CAPS
300.0000 mg | ORAL_CAPSULE | Freq: Three times a day (TID) | ORAL | Status: DC
  Administered 2022-08-15 – 2022-08-16 (×3): 300 mg via ORAL
  Filled 2022-08-15 (×3): qty 3

## 2022-08-15 NOTE — Progress Notes (Addendum)
POSTPARTUM POSTOP PROGRESS NOTE  POD #1  Subjective:  No acute events overnight.  Pt denies problems with ambulating, voiding or po intake.  She denies nausea or vomiting.  Pain is well controlled.  She has not had flatus. She has not had bowel movement.  Lochia Minimal.   Objective: Blood pressure (!) 121/59, pulse 85, temperature (!) 97.5 F (36.4 C), temperature source Oral, resp. rate 17, height 5\' 1"  (1.549 m), weight 129.9 kg, last menstrual period 11/14/2021, SpO2 97%, unknown if currently breastfeeding.  Physical Exam:  General: alert, cooperative and no distress Lochia:normal flow Chest: CTAB Heart: RRR no m/r/g Abdomen: +BS, soft, nontender Uterine Fundus: firm, 2cm below umbilicus. Honeycomb dressing intact, scant drainage Extremities: trace edema, neg calf TTP BL, neg Homans BL  Recent Labs    08/12/22 1106 08/15/22 0458  HGB 11.7* 10.7*  HCT 35.3* 32.2*    Assessment/Plan:  ASSESSMENT: Miranda Mack is a 27 y.o. [id number] s/p ERTLCS @ [redacted]w[redacted]d for h/o csx x1. PNC c/b BMI 54.   Plan for discharge tomorrow, Breastfeeding, and Lactation consult Continue ppx Lovenox given postop and BMI >50 Encourage ambulation   LOS: 1 day

## 2022-08-15 NOTE — Social Work (Signed)
CSW received consult for hx of Anxiety and Depression and EPDS score of 10.  CSW met with MOB to offer support and complete assessment. CSW entered the room and observed MOB exiting the restroom, FOB at bedside and the infant in the bassinet. CSW introduced self, CSW role and reason for visit. MOB was agreeable to visit and allowed FOB to remain in the room. CSW inquired about how MOB was feeling, MOB reported good. CSW inquired about MOB MH hx. MOB reported she was diagnosed in 2018. And reports she does not take any medication nor is she in therapy. CSW inquired about how MOB copes with her symptoms, MOB reported her symptoms are manageable and she just allows herself to feels the emotions until they pass. CSW inquired about MOB mood over the past 7 days, MOB reported her mood fluctuated due to a combination of factors, MOB did not go into further detail. CSW assessed for safety, MOB denied any SI or HI. CSW provided education regarding the baby blues period vs. perinatal mood disorders, discussed treatment and gave resources for mental health follow up if concerns arise.  CSW recommends self-evaluation during the postpartum time period using the New Mom Checklist from Postpartum Progress and encouraged MOB to contact a medical professional if symptoms are noted at any time. MOB identified FOB as her support.   CSW provided review of Sudden Infant Death Syndrome (SIDS) precautions.  MOB identified [facility] for infants follow up care. MOB reported she has all neccessary items for the infant including a bassinet, crib and car seat.  CSW identifies no further need for intervention and no barriers to discharge at this time.  Wende Neighbors, LCSWA Clinical Social Worker [phone]

## 2022-08-15 NOTE — Lactation Note (Signed)
This note was copied from a baby's chart. Lactation Consultation Note  Patient Name: Miranda Mack [id number] Date: 08/15/2022 Age:29 hours Reason for consult: Initial assessment;Term;Infant weight loss;Breastfeeding assistance As LC entered room, baby lying in the crib, baby sucking on the pacifier and acting hungry.  Per mom had BF her 1st baby 1 month. LC offered to assist to latch.  Mom latched the baby by herself in the cross cradle and baby opened wide. LC showed mom how to flip the upper lip to increase flange.  Per mom comfortable. Baby latched for 15 mins , noted to stay latched and pulling back from the nipple like she was sucking on the pacifier. When baby was noted to get frustrated due to slow flow, LC recommended to pace feed the supplement ( formula ) pace feeding.  LC instructed mom.  LC recommended options for increasing the flow from the breast prior to latch. Prior to latching warm a burp clothe with warm water, apply on the breast for 10 mins and the hand express, pre-pump with the hand pump 10 -20 strokes and latch the baby with firm support or apply a warm compress on the breast while the baby is feeding to enhance the flow.  LC stressed the importance of giving the baby practice at the breast.  And for now since the baby has been supplemented to continue due to the baby expecting more flow.    Maternal Data Does the patient have breastfeeding experience prior to this delivery?: Yes (experienced) How long did the patient breastfeed?: per mom 1 month  Feeding Mother's Current Feeding Choice: Breast Milk and Formula Nipple Type: Slow - flow  LATCH Score Latch: Grasps breast easily, tongue down, lips flanged, rhythmical sucking. (pulles back on the nipple and stays latched)  Audible Swallowing: A few with stimulation  Type of Nipple: Everted at rest and after stimulation  Comfort (Breast/Nipple): Soft / non-tender  Hold (Positioning): Assistance needed to  correctly position infant at breast and maintain latch.  LATCH Score: 8   Lactation Tools Discussed/Used Tools: Pump;Flanges Flange Size: 21;24 Breast pump type: Manual Pump Education: Milk Storage;Setup, frequency, and cleaning Reason for Pumping: see LC note  Interventions Interventions: Breast feeding basics reviewed;Assisted with latch;Skin to skin;Support pillows;Education;Hand pump;LC Services brochure  Discharge Pump: Personal;DEBP;Manual  Consult Status Consult Status: Complete Date: 08/15/22    Kathrin Greathouse 08/15/2022, 4:13 PM

## 2022-08-16 MED ORDER — OXYCODONE HCL 5 MG PO TABS: 5.0000 mg | ORAL_TABLET | ORAL | 0 refills | Status: DC | PRN

## 2022-08-16 NOTE — Progress Notes (Signed)
Discharge instructions reviewed with patient and SO. Pt verbalized understanding. Infant placed in car seat per parent. ID bands matched, umbilical clamp and security tag removed. Patient and infant discharged home in stable condition with all personal belongings. Pt requested to walk out to car.

## 2022-08-16 NOTE — Discharge Summary (Signed)
Postpartum Discharge Summary     Patient Name: Miranda Mack DOB: 1995/06/12 MRN: [id number]  Date of admission: 08/14/2022 Delivery date:08/14/2022 Delivering provider: Philip Aspen Date of discharge: 08/16/2022  Admitting diagnosis: Pregnant and not yet delivered [Z34.90] Cesarean delivery delivered [O82] Intrauterine pregnancy: [redacted]w[redacted]d     Secondary diagnosis:  Principal Problem:   Pregnant and not yet delivered Active Problems:   Cesarean delivery delivered  Additional problems: BMI >50, depression, anxiety, iron deficiency anemia    Discharge diagnosis: Term Pregnancy Delivered                                              Post partum procedures: none Augmentation: N/A Complications: None  Hospital course: Sceduled C/S   27 y.o. yo [id number] at [redacted]w[redacted]d was admitted to the hospital 08/14/2022 for scheduled cesarean section with the following indication:Elective Repeat.Delivery details are as follows:  Membrane Rupture Time/Date: 12:38 PM,08/14/2022  Delivery Method:C-Section, Low Transverse Operative Delivery:N/A Details of operation can be found in separate operative note.  Patient had a postpartum course complicated bynothing.  She is ambulating, tolerating a regular diet, passing flatus, and urinating well. Patient is discharged home in stable condition on  08/16/22        Newborn Data: Birth date:08/14/2022 Birth time:12:39 PM Gender:Female Living status:Living Apgars:9 ,9  Weight:4030 g    Magnesium Sulfate received: No BMZ received: No Rhophylac:N/A Transfusion:No  Physical exam  Vitals:   08/15/22 0601 08/15/22 1437 08/15/22 2300 08/16/22 0535  BP: (!) 121/59 112/65 131/61 136/78  Pulse: 85 64 64 72  Resp: 17 17 18 17   Temp: (!) 97.5 F (36.4 C) 97.7 F (36.5 C) 97.8 F (36.6 C) 98 F (36.7 C)  TempSrc: Oral Oral Oral Oral  SpO2: 97% 100% 98% 98%  Weight:      Height:       General: alert, cooperative, and no distress Lochia: appropriate Uterine  Fundus: firm Incision: Healing well with no significant drainage, No significant erythema, Dressing is clean, dry, and intact DVT Evaluation: No evidence of DVT seen on physical exam. Labs: Lab Results  Component Value Date   WBC 19.3 (H) 08/15/2022   HGB 10.7 (L) 08/15/2022   HCT 32.2 (L) 08/15/2022   MCV 89.7 08/15/2022   PLT 224 08/15/2022      Latest Ref Rng & Units 08/15/2022    4:58 AM  CMP  Creatinine 0.44 - 1.00 mg/dL 9.56    Edinburgh Score:    08/14/2022    3:00 PM  Edinburgh Postnatal Depression Scale Screening Tool  I have been able to laugh and see the funny side of things. 0  I have looked forward with enjoyment to things. 1  I have blamed myself unnecessarily when things went wrong. 1  I have been anxious or worried for no good reason. 1  I have felt scared or panicky for no good reason. 2  Things have been getting on top of me. 2  I have been so unhappy that I have had difficulty sleeping. 1  I have felt sad or miserable. 1  I have been so unhappy that I have been crying. 1  The thought of harming myself has occurred to me. 0  Edinburgh Postnatal Depression Scale Total 10      After visit meds:  Allergies as of 08/16/2022  Reactions   Fish Allergy Anaphylaxis   Peanut-containing Drug Products Anaphylaxis        Medication List     STOP taking these medications    cyclobenzaprine 10 MG tablet Commonly known as: FLEXERIL   ferrous sulfate 325 (65 FE) MG tablet   FOLIC ACID PO   metoCLOPramide 10 MG tablet Commonly known as: REGLAN   ondansetron 8 MG disintegrating tablet Commonly known as: ZOFRAN-ODT   prenatal multivitamin Tabs tablet       TAKE these medications    oxyCODONE 5 MG immediate release tablet Commonly known as: Oxy IR/ROXICODONE Take 1-2 tablets (5-10 mg total) by mouth every 4 (four) hours as needed for moderate pain.               Discharge Care Instructions  (From admission, onward)            Start     Ordered   08/16/22 0000  Discharge wound care:       Comments: Can remove dressing and steri strips 5 days from date of surgery   08/16/22 1543             Discharge home in stable condition  Infant Disposition:home with mother Discharge instruction: per After Visit Summary and Postpartum booklet. Activity: Advance as tolerated. Pelvic rest for 6 weeks.  Diet: routine diet Anticipated Birth Control: Unsure Postpartum Appointment:4 weeks Additional Postpartum F/U: Postpartum Depression checkup Future Appointments:No future appointments. Follow up Visit:  Follow-up Information     Philip Aspen, DO. Call in 4 week(s).   Specialty: Obstetrics and Gynecology Contact information: [address] [place] [address] [phone]                     08/16/2022 Philip Aspen, DO

## 2022-09-10 ENCOUNTER — Telehealth (HOSPITAL_COMMUNITY): Payer: Self-pay | Admitting: *Deleted

## 2022-09-10 NOTE — Telephone Encounter (Signed)
09/10/2022  Name: Miranda Mack MRN: [id number] DOB: 18-Apr-1995  Reason for Call:  Transition of Care Hospital Discharge Call  Contact Status: Patient Contact Status: Message  Language assistant needed: Interpreter Mode: Interpreter Not Needed        Follow-Up Questions:    Inocente Salles Postnatal Depression Scale:  In the Past 7 Days:    PHQ2-9 Depression Scale:     Discharge Follow-up:    Post-discharge interventions: NA  Salena Saner, RN 09/10/2022 11:10

## 2023-01-15 ENCOUNTER — Other Ambulatory Visit: Payer: Self-pay

## 2023-04-01 ENCOUNTER — Emergency Department (HOSPITAL_BASED_OUTPATIENT_CLINIC_OR_DEPARTMENT_OTHER)

## 2023-04-01 ENCOUNTER — Emergency Department (HOSPITAL_BASED_OUTPATIENT_CLINIC_OR_DEPARTMENT_OTHER)
Admission: EM | Admit: 2023-04-01 | Discharge: 2023-04-02 | Disposition: A | Discharge status: Home / Self Care | Attending: Emergency Medicine | Admitting: Emergency Medicine

## 2023-04-01 ENCOUNTER — Encounter (HOSPITAL_BASED_OUTPATIENT_CLINIC_OR_DEPARTMENT_OTHER): Payer: Self-pay | Admitting: Emergency Medicine

## 2023-04-01 ENCOUNTER — Other Ambulatory Visit: Payer: Self-pay

## 2023-04-01 DIAGNOSIS — R102 Pelvic and perineal pain: Secondary | ICD-10-CM | POA: Insufficient documentation

## 2023-04-01 DIAGNOSIS — R0789 Other chest pain: Secondary | ICD-10-CM | POA: Diagnosis not present

## 2023-04-01 DIAGNOSIS — Z91010 Allergy to peanuts: Secondary | ICD-10-CM | POA: Insufficient documentation

## 2023-04-01 DIAGNOSIS — R079 Chest pain, unspecified: Secondary | ICD-10-CM | POA: Diagnosis present

## 2023-04-01 DIAGNOSIS — D72829 Elevated white blood cell count, unspecified: Secondary | ICD-10-CM | POA: Diagnosis not present

## 2023-04-01 LAB — URINALYSIS, ROUTINE W REFLEX MICROSCOPIC
Bilirubin Urine: NEGATIVE
Glucose, UA: NEGATIVE mg/dL
Hgb urine dipstick: NEGATIVE
Ketones, ur: NEGATIVE mg/dL
Leukocytes,Ua: NEGATIVE
Nitrite: NEGATIVE
Protein, ur: NEGATIVE mg/dL
Specific Gravity, Urine: 1.02 (ref 1.005–1.030)
pH: 7 (ref 5.0–8.0)

## 2023-04-01 LAB — BASIC METABOLIC PANEL
Anion gap: 8 (ref 5–15)
BUN: 9 mg/dL (ref 6–20)
CO2: 26 mmol/L (ref 22–32)
Calcium: 9.1 mg/dL (ref 8.9–10.3)
Chloride: 104 mmol/L (ref 98–111)
Creatinine, Ser: 0.66 mg/dL (ref 0.44–1.00)
GFR, Estimated: >60 mL/min (ref >60)
Glucose, Bld: 107 mg/dL — ABNORMAL HIGH (ref 70–99)
Potassium: 3.7 mmol/L (ref 3.5–5.1)
Sodium: 138 mmol/L (ref 135–145)

## 2023-04-01 LAB — CBC
HCT: 34.7 % — ABNORMAL LOW (ref 36.0–46.0)
Hemoglobin: 11.4 g/dL — ABNORMAL LOW (ref 12.0–15.0)
MCH: 28.2 pg (ref 26.0–34.0)
MCHC: 32.9 g/dL (ref 30.0–36.0)
MCV: 85.9 fL (ref 80.0–100.0)
Platelets: 382 10*3/uL (ref 150–400)
RBC: 4.04 MIL/uL (ref 3.87–5.11)
RDW: 14.6 % (ref 11.5–15.5)
WBC: 12.5 10*3/uL — ABNORMAL HIGH (ref 4.0–10.5)
nRBC: 0 % (ref 0.0–0.2)

## 2023-04-01 LAB — TROPONIN I (HIGH SENSITIVITY): Troponin I (High Sensitivity): <2 ng/L (ref <18)

## 2023-04-01 LAB — PREGNANCY, URINE: Preg Test, Ur: NEGATIVE

## 2023-04-01 LAB — RESP PANEL BY RT-PCR (RSV, FLU A&B, COVID)  RVPGX2
Influenza A by PCR: NEGATIVE
Influenza B by PCR: NEGATIVE
Resp Syncytial Virus by PCR: NEGATIVE
SARS Coronavirus 2 by RT PCR: NEGATIVE

## 2023-04-01 LAB — LIPASE, BLOOD: Lipase: 23 U/L (ref 11–51)

## 2023-04-01 NOTE — ED Provider Notes (Incomplete)
 [place] [facility]MENT AT [facility] Provider Note   CSN: [id number] Arrival date & time: 04/01/23  1658     History {Add pertinent medical, surgical, social history, OB history to HPI:1} Chief Complaint  Patient presents with  . Chest Pain  . Abdominal Pain    Miranda Mack is a 28 y.o. female presents with complaints of chest pain with associated dizziness x 2 days and left lower suprapubic pain x 1 day.  Patient denies any associated shortness of breath or cough.  She has no history of cardiac issues or blood clots.  No recent surgeries she is not on birth control.  No exertional component to her symptoms.  She denies any nausea, vomiting.  She did have 1 episode of diarrhea without hematochezia.  Has a history of ovarian cysts.  States this feels similar but worse.   Chest Pain Associated symptoms: abdominal pain   Abdominal Pain Associated symptoms: chest pain    Past Medical History:  Diagnosis Date  . [redacted] weeks gestation of pregnancy 02/02/2020  . Anemia   . Anxiety   . Appendicitis 04/01/2017  . Depression   . GERD (gastroesophageal reflux disease)   . Headache   . Miscarriage within last 12 months   . Ovarian cyst        Home Medications Prior to Admission medications   Medication Sig Start Date End Date Taking? Authorizing Provider  oxyCODONE (OXY IR/ROXICODONE) 5 MG immediate release tablet Take 1-2 tablets (5-10 mg total) by mouth every 4 (four) hours as needed for moderate pain. 08/16/22   Philip Aspen, DO  misoprostol (CYTOTEC) 200 MCG tablet Place four tablets in between your gums and cheeks (two tablets on each side) as instructed Patient not taking: Reported on 09/25/2018 09/17/18 07/01/19  Levie Heritage, DO      Allergies    Fish allergy and Peanut-containing drug products    Review of Systems   Review of Systems  Cardiovascular:  Positive for chest pain.  Gastrointestinal:  Positive for abdominal pain.    Physical  Exam Updated Vital Signs BP 127/79 (BP Location: Left Arm)   Pulse (!) 102   Temp 99 F (37.2 C)   Resp 18   Ht 5\' 3"  (1.6 m)   Wt 133.8 kg   LMP 03/07/2023 (Approximate)   SpO2 98%   BMI 52.26 kg/m  Physical Exam  ED Results / Procedures / Treatments   Labs (all labs ordered are listed, but only abnormal results are displayed) Labs Reviewed  BASIC METABOLIC PANEL - Abnormal; Notable for the following components:      Result Value   Glucose, Bld 107 (*)    All other components within normal limits  CBC - Abnormal; Notable for the following components:   WBC 12.5 (*)    Hemoglobin 11.4 (*)    HCT 34.7 (*)    All other components within normal limits  RESP PANEL BY RT-PCR (RSV, FLU A&B, COVID)  RVPGX2  PREGNANCY, URINE  LIPASE, BLOOD  URINALYSIS, ROUTINE W REFLEX MICROSCOPIC  TROPONIN I (HIGH SENSITIVITY)  TROPONIN I (HIGH SENSITIVITY)    EKG None  Radiology DG Chest 2 View Result Date: 04/01/2023 CLINICAL DATA:  Chest pain, dizziness EXAM: CHEST - 2 VIEW COMPARISON:  01/25/2018 FINDINGS: Lungs are clear.  No pneumothorax. Heart size and mediastinal contours are within normal limits. No effusion. Visualized bones unremarkable. IMPRESSION: No acute cardiopulmonary disease. Electronically Signed   By: Ronald Pippins.D.  On: 04/01/2023 19:14    Procedures Procedures  {Document cardiac monitor, telemetry assessment procedure when appropriate:1}  Medications Ordered in ED Medications  ketorolac (TORADOL) 15 MG/ML injection 15 mg (has no administration in time range)    ED Course/ Medical Decision Making/ A&P   {   Click here for ABCD2, HEART and other calculatorsREFRESH Note before signing :1}                              Medical Decision Making Amount and/or Complexity of Data Reviewed Labs: ordered. Radiology: ordered.   This patient presents to the ED with chief complaint(s) of CP and pelvic pain.  The complaint involves an extensive differential diagnosis  and also carries with it a high risk of complications and morbidity.   pertinent past medical history as listed in HPI  The differential diagnosis includes  ACS, PE, aortic dissection, pneumonia, pneumothorax, myocarditis, pericarditis, musculoskeletal, GERD, appendicitis, cholecystitis, diverticulitis, ovarian cyst, ovarian torsion The initial plan is to  Will start with basic labs, EKG, chest x-ray Additional history obtained: Records reviewed Care Everywhere/External Records  Initial Assessment:   Hemodynamically stable, afebrile, nontoxic-appearing patient presenting with complaints of chest pain x 2 days and left suprapubic pain x 1 day.  On exam her lungs are clear, she does have left sided suprapubic tenderness.  She does additionally have reproducible central chest tenderness without overlying lesions or rashes.  No cough or fever to suggest URI.  She does have a mild leukocytosis but this appears to be improving from prior visits.  She has no cardiac history or prior blood clots no risk factors of either.  She is PERC negative.  No tenderness to McBurney's point suspicion for appendicitis negative Murphy sign no suspicion for cholecystitis.  No hematochezia to suggest diverticulitis.  Pain is similar to prior ovarian cyst but notably worse will start with pelvic ultrasound.   Independent ECG interpretation:  Sinus rhythm without ischemic changes  Independent labs interpretation:  The following labs were independently interpreted:  BC with leukocytosis of 12.5, hemoglobin stable, BMP without significant abnormality, troponins without elevation, lipase within normal limits, respiratory panel negative, UA remarkable  Independent visualization and interpretation of imaging: I independently visualized the following imaging with scope of interpretation limited to determining acute life threatening conditions related to emergency care: Chest x-ray, which revealed no acute  abnormality  Treatment and Reassessment: Given Toradol following first assessment  Consultations obtained:   none  Disposition:   ***  Social Determinants of Health:   none  This note was dictated with voice recognition software.  Despite best efforts at proofreading, errors may have occurred which can change the documentation meaning.    {Document critical care time when appropriate:1} {Document review of labs and clinical decision tools ie heart score, Chads2Vasc2 etc:1}  {Document your independent review of radiology images, and any outside records:1} {Document your discussion with family members, caretakers, and with consultants:1} {Document social determinants of health affecting pt's care:1} {Document your decision making why or why not admission, treatments were needed:1} Final Clinical Impression(s) / ED Diagnoses Final diagnoses:  None    Rx / DC Orders ED Discharge Orders     None

## 2023-04-01 NOTE — ED Provider Notes (Signed)
 [place] [facility] AT [facility] Provider Note   CSN: [id number] Arrival date & time: 04/01/23  1658     History  Chief Complaint  Patient presents with   Chest Pain   Abdominal Pain    Miranda Mack is a 28 y.o. female presents with complaints of chest pain with associated dizziness x 2 days and left lower suprapubic pain x 1 day.  Patient denies any associated shortness of breath or cough.  She has no history of cardiac issues or blood clots.  No recent surgeries she is not on birth control.  No exertional component to her symptoms.  She denies any nausea, vomiting.  She did have 1 episode of diarrhea without hematochezia.  Has a history of ovarian cysts.  States this feels similar but worse.   Chest Pain Associated symptoms: abdominal pain   Abdominal Pain Associated symptoms: chest pain    Past Medical History:  Diagnosis Date   [redacted] weeks gestation of pregnancy 02/02/2020   Anemia    Anxiety    Appendicitis 04/01/2017   Depression    GERD (gastroesophageal reflux disease)    Headache    Miscarriage within last 12 months    Ovarian cyst        Home Medications Prior to Admission medications   Medication Sig Start Date End Date Taking? Authorizing Provider  oxyCODONE (OXY IR/ROXICODONE) 5 MG immediate release tablet Take 1-2 tablets (5-10 mg total) by mouth every 4 (four) hours as needed for moderate pain. 08/16/22   Philip Aspen, DO  misoprostol (CYTOTEC) 200 MCG tablet Place four tablets in between your gums and cheeks (two tablets on each side) as instructed Patient not taking: Reported on 09/25/2018 09/17/18 07/01/19  Levie Heritage, DO      Allergies    Fish allergy and Peanut-containing drug products    Review of Systems   Review of Systems  Cardiovascular:  Positive for chest pain.  Gastrointestinal:  Positive for abdominal pain.    Physical Exam Updated Vital Signs BP 127/79 (BP Location: Left Arm)   Pulse (!) 102   Temp  99 F (37.2 C)   Resp 18   Ht 5\' 3"  (1.6 m)   Wt 133.8 kg   LMP 03/07/2023 (Approximate)   SpO2 98%   BMI 52.26 kg/m  Physical Exam  ED Results / Procedures / Treatments   Labs (all labs ordered are listed, but only abnormal results are displayed) Labs Reviewed  BASIC METABOLIC PANEL - Abnormal; Notable for the following components:      Result Value   Glucose, Bld 107 (*)    All other components within normal limits  CBC - Abnormal; Notable for the following components:   WBC 12.5 (*)    Hemoglobin 11.4 (*)    HCT 34.7 (*)    All other components within normal limits  RESP PANEL BY RT-PCR (RSV, FLU A&B, COVID)  RVPGX2  PREGNANCY, URINE  LIPASE, BLOOD  URINALYSIS, ROUTINE W REFLEX MICROSCOPIC  TROPONIN I (HIGH SENSITIVITY)  TROPONIN I (HIGH SENSITIVITY)    EKG None  Radiology DG Chest 2 View Result Date: 04/01/2023 CLINICAL DATA:  Chest pain, dizziness EXAM: CHEST - 2 VIEW COMPARISON:  01/25/2018 FINDINGS: Lungs are clear.  No pneumothorax. Heart size and mediastinal contours are within normal limits. No effusion. Visualized bones unremarkable. IMPRESSION: No acute cardiopulmonary disease. Electronically Signed   By: Corlis Leak M.D.   On: 04/01/2023 19:14    Procedures Procedures  Medications Ordered in ED Medications  ketorolac (TORADOL) 15 MG/ML injection 15 mg (has no administration in time range)  ondansetron (ZOFRAN-ODT) disintegrating tablet 4 mg (has no administration in time range)    ED Course/ Medical Decision Making/ A&P                                 Medical Decision Making Amount and/or Complexity of Data Reviewed Labs: ordered. Radiology: ordered.   This patient presents to the ED with chief complaint(s) of CP and pelvic pain.  The complaint involves an extensive differential diagnosis and also carries with it a high risk of complications and morbidity.   pertinent past medical history as listed in HPI  The differential diagnosis includes   ACS, PE, aortic dissection, pneumonia, pneumothorax, myocarditis, pericarditis, musculoskeletal, GERD, appendicitis, cholecystitis, diverticulitis, ovarian cyst, ovarian torsion The initial plan is to  Will start with basic labs, EKG, chest x-ray Additional history obtained: Records reviewed Care Everywhere/External Records  Initial Assessment:   Hemodynamically stable, afebrile, nontoxic-appearing patient presenting with complaints of chest pain x 2 days and left suprapubic pain x 1 day.  On exam her lungs are clear, she does have left sided suprapubic tenderness.  She does additionally have reproducible central chest tenderness without overlying lesions or rashes.  No cough or fever to suggest URI.  She does have a mild leukocytosis but this appears to be improving from prior visits.  She has no cardiac history or prior blood clots no risk factors of either.  She is PERC negative.  No tenderness to McBurney's point, no suspicion for appendicitis negative Murphy sign no suspicion for cholecystitis.  No hematochezia to suggest diverticulitis.  Pain is similar to prior ovarian cyst but notably worse will start with pelvic ultrasound. No urinary symptoms to suggest cystitis or pyelonephritis.  No vaginal bleeding or discharge to suggest PID.    Independent ECG interpretation:  Sinus rhythm without ischemic changes  Independent labs interpretation:  The following labs were independently interpreted:  BC with leukocytosis of 12.5, hemoglobin stable, BMP without significant abnormality, troponins without elevation, lipase within normal limits, respiratory panel negative, UA remarkable  Independent visualization and interpretation of imaging: I independently visualized the following imaging with scope of interpretation limited to determining acute life threatening conditions related to emergency care: Chest x-ray, which revealed no acute abnormality  Treatment and Reassessment: Given Toradol and  zofran following first assessment  Consultations obtained:   none  Disposition:   Signout given to Dr. Madilyn Hook.  Disposition pending workup.  Please see her note for the remainder of the visit.  Social Determinants of Health:   none  This note was dictated with voice recognition software.  Despite best efforts at proofreading, errors may have occurred which can change the documentation meaning.          Final Clinical Impression(s) / ED Diagnoses Final diagnoses:  Atypical chest pain  Suprapubic pain    Rx / DC Orders ED Discharge Orders     None         Halford Decamp, PA-C 04/02/23 0013    Sloan Leiter, DO 04/05/23 [phone]

## 2023-04-01 NOTE — ED Triage Notes (Signed)
 Pt reports intermittent substernal CP & pressure that started yesterday, reports some dizziness, denies ShoB, n/v, diaphoresis or other sxs  Also c/o bilateral lower ABD pain that started yesterday, describes as "sharp/stabbing", denies any urinary sxs

## 2023-04-01 NOTE — ED Notes (Signed)
 Pt given sample cup for UA

## 2023-04-02 MED ORDER — ONDANSETRON 4 MG PO TBDP: 4.0000 mg | ORAL_TABLET | Freq: Three times a day (TID) | ORAL | 0 refills | Status: DC | PRN

## 2023-04-02 MED ORDER — KETOROLAC TROMETHAMINE 15 MG/ML IJ SOLN: 15.0000 mg | Freq: Once | INTRAMUSCULAR | Status: DC

## 2023-04-02 MED ORDER — ONDANSETRON 4 MG PO TBDP
4.0000 mg | ORAL_TABLET | Freq: Once | ORAL | Status: AC
  Administered 2023-04-02: 4 mg via ORAL
  Filled 2023-04-02: qty 1

## 2023-04-02 NOTE — ED Notes (Signed)
 PA stated he didn't need the 2nd troponin.

## 2023-04-02 NOTE — Discharge Instructions (Addendum)
 The ultrasound today did not show your right ovary.  Please follow up with your OBGYN for recheck.  Get rechecked sooner for new or concerning symptoms.

## 2023-04-02 NOTE — ED Provider Notes (Signed)
 Care assumed at midnight.  Pt here with two weeks of intermittent chest pain and lower abdominal pain since yesterday.   Care assumed pending pelvic US.  [place] with normal left ovary, nonvisualization of right ovary.  No current pain, no RLQ tenderness on exam.  Feel pt is stable for discharge home with outpatient followup and return precautions.     Tilden Fossa, MD 04/02/23 [phone]

## 2023-06-07 ENCOUNTER — Emergency Department (HOSPITAL_COMMUNITY)
Admission: EM | Admit: 2023-06-07 | Discharge: 2023-06-07 | Disposition: A | Discharge status: Home / Self Care | Attending: Emergency Medicine | Admitting: Emergency Medicine

## 2023-06-07 ENCOUNTER — Emergency Department (HOSPITAL_COMMUNITY)

## 2023-06-07 ENCOUNTER — Encounter (HOSPITAL_COMMUNITY): Payer: Self-pay

## 2023-06-07 ENCOUNTER — Other Ambulatory Visit: Payer: Self-pay

## 2023-06-07 DIAGNOSIS — D72829 Elevated white blood cell count, unspecified: Secondary | ICD-10-CM | POA: Insufficient documentation

## 2023-06-07 DIAGNOSIS — Z91010 Allergy to peanuts: Secondary | ICD-10-CM | POA: Insufficient documentation

## 2023-06-07 DIAGNOSIS — R1013 Epigastric pain: Secondary | ICD-10-CM | POA: Diagnosis not present

## 2023-06-07 DIAGNOSIS — R112 Nausea with vomiting, unspecified: Secondary | ICD-10-CM | POA: Insufficient documentation

## 2023-06-07 DIAGNOSIS — R1010 Upper abdominal pain, unspecified: Secondary | ICD-10-CM | POA: Insufficient documentation

## 2023-06-07 LAB — URINALYSIS, ROUTINE W REFLEX MICROSCOPIC
Bilirubin Urine: NEGATIVE
Glucose, UA: NEGATIVE mg/dL
Hgb urine dipstick: NEGATIVE
Ketones, ur: NEGATIVE mg/dL
Leukocytes,Ua: NEGATIVE
Nitrite: NEGATIVE
Protein, ur: NEGATIVE mg/dL
Specific Gravity, Urine: 1.023 (ref 1.005–1.030)
pH: 7 (ref 5.0–8.0)

## 2023-06-07 LAB — CBC WITH DIFFERENTIAL/PLATELET
Abs Immature Granulocytes: 0.05 10*3/uL (ref 0.00–0.07)
Basophils Absolute: 0 10*3/uL (ref 0.0–0.1)
Basophils Relative: 0 %
Eosinophils Absolute: 0.2 10*3/uL (ref 0.0–0.5)
Eosinophils Relative: 2 %
HCT: 36.9 % (ref 36.0–46.0)
Hemoglobin: 11.8 g/dL — ABNORMAL LOW (ref 12.0–15.0)
Immature Granulocytes: 0 %
Lymphocytes Relative: 11 %
Lymphs Abs: 1.7 10*3/uL (ref 0.7–4.0)
MCH: 27.6 pg (ref 26.0–34.0)
MCHC: 32 g/dL (ref 30.0–36.0)
MCV: 86.4 fL (ref 80.0–100.0)
Monocytes Absolute: 0.8 10*3/uL (ref 0.1–1.0)
Monocytes Relative: 5 %
Neutro Abs: 12.8 10*3/uL — ABNORMAL HIGH (ref 1.7–7.7)
Neutrophils Relative %: 82 %
Platelets: 335 10*3/uL (ref 150–400)
RBC: 4.27 MIL/uL (ref 3.87–5.11)
RDW: 15.1 % (ref 11.5–15.5)
WBC: 15.6 10*3/uL — ABNORMAL HIGH (ref 4.0–10.5)
nRBC: 0 % (ref 0.0–0.2)

## 2023-06-07 LAB — COMPREHENSIVE METABOLIC PANEL WITH GFR
ALT: 10 U/L (ref 0–44)
AST: 13 U/L — ABNORMAL LOW (ref 15–41)
Albumin: 4 g/dL (ref 3.5–5.0)
Alkaline Phosphatase: 66 U/L (ref 38–126)
Anion gap: 6 (ref 5–15)
BUN: 13 mg/dL (ref 6–20)
CO2: 23 mmol/L (ref 22–32)
Calcium: 9 mg/dL (ref 8.9–10.3)
Chloride: 106 mmol/L (ref 98–111)
Creatinine, Ser: 0.72 mg/dL (ref 0.44–1.00)
GFR, Estimated: >60 mL/min (ref >60)
Glucose, Bld: 108 mg/dL — ABNORMAL HIGH (ref 70–99)
Potassium: 3.7 mmol/L (ref 3.5–5.1)
Sodium: 135 mmol/L (ref 135–145)
Total Bilirubin: 0.7 mg/dL (ref 0.0–1.2)
Total Protein: 8 g/dL (ref 6.5–8.1)

## 2023-06-07 LAB — LIPASE, BLOOD: Lipase: 26 U/L (ref 11–51)

## 2023-06-07 LAB — PREGNANCY, URINE: Preg Test, Ur: NEGATIVE

## 2023-06-07 MED ORDER — ONDANSETRON HCL 4 MG/2ML IJ SOLN
4.0000 mg | Freq: Once | INTRAMUSCULAR | Status: AC
  Administered 2023-06-07: 4 mg via INTRAVENOUS
  Filled 2023-06-07: qty 2

## 2023-06-07 MED ORDER — FENTANYL CITRATE PF 50 MCG/ML IJ SOSY
50.0000 ug | PREFILLED_SYRINGE | Freq: Once | INTRAMUSCULAR | Status: AC
  Administered 2023-06-07: 50 ug via INTRAVENOUS
  Filled 2023-06-07: qty 1

## 2023-06-07 MED ORDER — LACTATED RINGERS IV BOLUS
1000.0000 mL | Freq: Once | INTRAVENOUS | Status: AC
  Administered 2023-06-07: 1000 mL via INTRAVENOUS

## 2023-06-07 MED ORDER — IOHEXOL 300 MG/ML  SOLN
100.0000 mL | Freq: Once | INTRAMUSCULAR | Status: AC | PRN
  Administered 2023-06-07: 100 mL via INTRAVENOUS

## 2023-06-07 MED ORDER — ONDANSETRON 4 MG PO TBDP: 4.0000 mg | ORAL_TABLET | Freq: Three times a day (TID) | ORAL | 0 refills | Status: AC | PRN

## 2023-06-07 MED ORDER — FAMOTIDINE IN NACL 20-0.9 MG/50ML-% IV SOLN
20.0000 mg | Freq: Once | INTRAVENOUS | Status: AC
  Administered 2023-06-07: 20 mg via INTRAVENOUS
  Filled 2023-06-07: qty 50

## 2023-06-07 NOTE — Discharge Instructions (Signed)
 Your testing is reassuring.  May be due to a virus and may be due to [facility].  Start with a clear liquid diet and advance slowly as tolerated.  Follow-up with your doctor.  You may need to go back on your lower dose of Wegovy.  Return to the ED with new or worsening symptoms.

## 2023-06-07 NOTE — ED Notes (Signed)
Pt aware of needing a urine sample.

## 2023-06-07 NOTE — ED Triage Notes (Signed)
 Pt BIBA from home, c/o abd. Cramping, upper abd, nausea, and vomited. Started taking full dose of wegovy last night after not having it for 2 weeks.  Started 4 hours ago after eating. VSS.  CBG 126

## 2023-06-07 NOTE — ED Provider Notes (Signed)
 [place] EMERGENCY DEPARTMENT AT [facility] Provider Note   CSN: [id number] Arrival date & time: 06/07/23  6213     History  Chief Complaint  Patient presents with   Abdominal Pain   Nausea    Miranda Mack is a 28 y.o. female.  Patient from home with abdominal cramping, nausea, vomiting since about 7:30 PM.  She was well prior to this.  Describes abdominal cramping in her upper abdomen that comes and goes and feels better with vomiting.  Estimates she is vomited about 4 or 5 times.  No black or bloody stools.  No fever.  No chest pain or shortness of breath.  She is never had this pain in the past.  Still has gallbladder but no appendix.  Still has uterus and ovaries.  Recently took her [facility] May 22 after not having it for 2 weeks.  This was the first time her dosages were increased.  Had some crampy low back pain prior to her abdominal pain onset but her back pain has resolved.  There is no pain with urination or blood in the urine.  No vaginal bleeding or discharge. Did have a bowel movement earlier today that was normal.  Denies diarrhea.  The history is provided by the patient and the EMS personnel.  Abdominal Pain Associated symptoms: nausea and vomiting   Associated symptoms: no chest pain, no cough, no dysuria, no fever, no hematuria and no shortness of breath        Home Medications Prior to Admission medications   Medication Sig Start Date End Date Taking? Authorizing Provider  ondansetron  (ZOFRAN -ODT) 4 MG disintegrating tablet Take 1 tablet (4 mg total) by mouth every 8 (eight) hours as needed. 04/02/23   Kelsey Patricia, MD  oxyCODONE  (OXY IR/ROXICODONE ) 5 MG immediate release tablet Take 1-2 tablets (5-10 mg total) by mouth every 4 (four) hours as needed for moderate pain. 08/16/22   Atlas Blank, DO  misoprostol  (CYTOTEC ) 200 MCG tablet Place four tablets in between your gums and cheeks (two tablets on each side) as instructed Patient not taking:  Reported on 09/25/2018 09/17/18 07/01/19  Stinson, Jacob J, DO      Allergies    Fish allergy and Peanut-containing drug products    Review of Systems   Review of Systems  Constitutional:  Positive for activity change and appetite change. Negative for fever.  HENT:  Negative for congestion and rhinorrhea.   Respiratory:  Negative for cough, chest tightness and shortness of breath.   Cardiovascular:  Negative for chest pain.  Gastrointestinal:  Positive for abdominal pain, nausea and vomiting. Negative for rectal pain.  Genitourinary:  Negative for dysuria and hematuria.  Musculoskeletal:  Negative for arthralgias.  Skin:  Negative for rash.  Neurological:  Negative for dizziness, weakness and headaches.   all other systems are negative except as noted in the HPI and PMH.    Physical Exam Updated Vital Signs BP (!) 140/73 (BP Location: Right Arm)   Pulse 76   Temp 98.1 F (36.7 C) (Oral)   Resp 16   Ht 5\' 3"  (1.6 m)   Wt 133.8 kg   SpO2 99%   BMI 52.25 kg/m  Physical Exam Vitals and nursing note reviewed.  Constitutional:      General: She is not in acute distress.    Appearance: She is well-developed.  HENT:     Head: Normocephalic and atraumatic.     Mouth/Throat:     Pharynx: No oropharyngeal exudate.  Eyes:     Conjunctiva/sclera: Conjunctivae normal.     Pupils: Pupils are equal, round, and reactive to light.  Neck:     Comments: No meningismus. Cardiovascular:     Rate and Rhythm: Normal rate and regular rhythm.     Heart sounds: Normal heart sounds. No murmur heard. Pulmonary:     Effort: Pulmonary effort is normal. No respiratory distress.     Breath sounds: Normal breath sounds.  Abdominal:     Palpations: Abdomen is soft.     Tenderness: There is abdominal tenderness. There is no guarding or rebound.     Comments: Epigastric tenderness, no guarding or rebound.  No right upper quadrant tenderness or lower abdominal tenderness  Musculoskeletal:         General: No tenderness. Normal range of motion.     Cervical back: Normal range of motion and neck supple.  Skin:    General: Skin is warm.  Neurological:     Mental Status: She is alert and oriented to person, place, and time.     Cranial Nerves: No cranial nerve deficit.     Motor: No abnormal muscle tone.     Coordination: Coordination normal.     Comments:  5/5 strength throughout. CN 2-12 intact.Equal grip strength.   Psychiatric:        Behavior: Behavior normal.     ED Results / Procedures / Treatments   Labs (all labs ordered are listed, but only abnormal results are displayed) Labs Reviewed  CBC WITH DIFFERENTIAL/PLATELET - Abnormal; Notable for the following components:      Result Value   WBC 15.6 (*)    Hemoglobin 11.8 (*)    Neutro Abs 12.8 (*)    All other components within normal limits  COMPREHENSIVE METABOLIC PANEL WITH GFR - Abnormal; Notable for the following components:   Glucose, Bld 108 (*)    AST 13 (*)    All other components within normal limits  LIPASE, BLOOD  URINALYSIS, ROUTINE W REFLEX MICROSCOPIC  PREGNANCY, URINE    EKG None  Radiology CT ABDOMEN PELVIS W CONTRAST Result Date: 06/07/2023 CLINICAL DATA:  Acute, nonlocalized abdominal pain.  Epigastric pain EXAM: CT ABDOMEN AND PELVIS WITH CONTRAST TECHNIQUE: Multidetector CT imaging of the abdomen and pelvis was performed using the standard protocol following bolus administration of intravenous contrast. RADIATION DOSE REDUCTION: This exam was performed according to the departmental dose-optimization program which includes automated exposure control, adjustment of the mA and/or kV according to patient size and/or use of iterative reconstruction technique. CONTRAST:  OMNIPAQUE  IOHEXOL  300 MG/ML  SOLN COMPARISON:  08/23/2020 FINDINGS: Lower chest:  No contributory findings. Hepatobiliary: No focal liver abnormality.No evidence of biliary obstruction or stone. Pancreas: Unremarkable. Spleen:  Unremarkable. Adrenals/Urinary Tract: Negative adrenals. No hydronephrosis or stone. Unremarkable bladder. Stomach/Bowel:  No obstruction. Appendectomy. Vascular/Lymphatic: No acute vascular abnormality. No mass or adenopathy. Reproductive:No pathologic findings. Corpus luteum appearance on the right Other: No ascites or pneumoperitoneum. Musculoskeletal: No acute abnormalities. IMPRESSION: No acute finding or explanation for symptoms. Electronically Signed   By: Ronnette Coke M.D.   On: 06/07/2023 06:47    Procedures Procedures    Medications Ordered in ED Medications  lactated ringers  bolus 1,000 mL (has no administration in time range)  ondansetron  (ZOFRAN ) injection 4 mg (has no administration in time range)  fentaNYL  (SUBLIMAZE ) injection 50 mcg (has no administration in time range)  famotidine  (PEPCID ) IVPB 20 mg premix (has no administration in time range)  ED Course/ Medical Decision Making/ A&P                                 Medical Decision Making Amount and/or Complexity of Data Reviewed Labs: ordered. Decision-making details documented in ED Course. Radiology: ordered and independent interpretation performed. Decision-making details documented in ED Course. ECG/medicine tests: ordered and independent interpretation performed. Decision-making details documented in ED Course.  Risk Prescription drug management.   Upper abdominal pain with nausea and vomiting.  Stable vitals.  Abdomen soft without peritoneal signs.  Recent increased her Wegovy dose.  Patient given IV fluids.  Does show leukocytosis of 15 which appears to be chronic.  LFTs and lipase are normal.  Urinalysis is negative.  hCG is negative  Leukocytosis appears to be chronic.  Patient appears well and is tolerating p.o.  Abdomen soft without peritoneal signs.  CT scan negative for acute surgical pathology.  Suspect likely viral syndrome versus Wegovy side effects.  Patient feels well and is tolerating  p.o.  Discussed starting with a clear liquid diet and advancing slowly as can tolerate.  Discussed discussing with her doctor lowering Wegovy dose.  Return to the ED with new or worsening symptoms.        Final Clinical Impression(s) / ED Diagnoses Final diagnoses:  None    Rx / DC Orders ED Discharge Orders     None         Brittannie Tawney, Mara [place], MD 06/07/23 [phone]

## 2023-09-15 ENCOUNTER — Other Ambulatory Visit: Payer: Self-pay | Admitting: Obstetrics and Gynecology

## 2023-09-15 DIAGNOSIS — Z01818 Encounter for other preprocedural examination: Secondary | ICD-10-CM

## 2023-09-19 ENCOUNTER — Other Ambulatory Visit: Payer: Self-pay

## 2023-09-19 ENCOUNTER — Emergency Department (HOSPITAL_BASED_OUTPATIENT_CLINIC_OR_DEPARTMENT_OTHER)
Admission: EM | Admit: 2023-09-19 | Discharge: 2023-09-20 | Disposition: A | Discharge status: Home / Self Care | Attending: Emergency Medicine | Admitting: Emergency Medicine

## 2023-09-19 ENCOUNTER — Encounter (HOSPITAL_BASED_OUTPATIENT_CLINIC_OR_DEPARTMENT_OTHER): Payer: Self-pay | Admitting: Urology

## 2023-09-19 ENCOUNTER — Emergency Department (HOSPITAL_COMMUNITY)

## 2023-09-19 ENCOUNTER — Emergency Department (HOSPITAL_BASED_OUTPATIENT_CLINIC_OR_DEPARTMENT_OTHER)

## 2023-09-19 DIAGNOSIS — R200 Anesthesia of skin: Secondary | ICD-10-CM

## 2023-09-19 DIAGNOSIS — D649 Anemia, unspecified: Secondary | ICD-10-CM | POA: Insufficient documentation

## 2023-09-19 DIAGNOSIS — D72829 Elevated white blood cell count, unspecified: Secondary | ICD-10-CM | POA: Diagnosis not present

## 2023-09-19 DIAGNOSIS — R519 Headache, unspecified: Secondary | ICD-10-CM | POA: Insufficient documentation

## 2023-09-19 LAB — COMPREHENSIVE METABOLIC PANEL WITH GFR
ALT: 9 U/L (ref 0–44)
AST: 14 U/L — ABNORMAL LOW (ref 15–41)
Albumin: 4.4 g/dL (ref 3.5–5.0)
Alkaline Phosphatase: 77 U/L (ref 38–126)
Anion gap: 10 (ref 5–15)
BUN: 10 mg/dL (ref 6–20)
CO2: 24 mmol/L (ref 22–32)
Calcium: 9.6 mg/dL (ref 8.9–10.3)
Chloride: 105 mmol/L (ref 98–111)
Creatinine, Ser: 0.87 mg/dL (ref 0.44–1.00)
GFR, Estimated: >60 mL/min (ref >60)
Glucose, Bld: 104 mg/dL — ABNORMAL HIGH (ref 70–99)
Potassium: 3.8 mmol/L (ref 3.5–5.1)
Sodium: 140 mmol/L (ref 135–145)
Total Bilirubin: 0.5 mg/dL (ref 0.0–1.2)
Total Protein: 7.9 g/dL (ref 6.5–8.1)

## 2023-09-19 LAB — CBC
HCT: 33.9 % — ABNORMAL LOW (ref 36.0–46.0)
Hemoglobin: 11 g/dL — ABNORMAL LOW (ref 12.0–15.0)
MCH: 27.9 pg (ref 26.0–34.0)
MCHC: 32.4 g/dL (ref 30.0–36.0)
MCV: 86 fL (ref 80.0–100.0)
Platelets: 355 K/uL (ref 150–400)
RBC: 3.94 MIL/uL (ref 3.87–5.11)
RDW: 14.4 % (ref 11.5–15.5)
WBC: 14.1 K/uL — ABNORMAL HIGH (ref 4.0–10.5)
nRBC: 0 % (ref 0.0–0.2)

## 2023-09-19 NOTE — ED Triage Notes (Signed)
 Pt states pcp told her to come to ER  State having numbness and tingling to left side states headaches and dizziness as well x 4 days  Also states vision changes

## 2023-09-19 NOTE — ED Provider Notes (Signed)
 Emergency Department Provider Note   I have reviewed the triage vital signs and the nursing notes.   HISTORY  Chief Complaint Numbness   HPI Miranda Mack is a 28 y.o. female past history reviewed below presents to the emergency department with left arm numbness/tingling for the past 4 days.  For the past couple of weeks she has had some associated intermittent blurry vision with headaches and cold spots to the left leg and arm.  She states those have been intermittent without clear provoking factors.  For the past 4 days she has had some associated neck pain with tingling in her arm.  No weakness.  Yesterday both her left arm and leg were weak but the leg quickly resolved and returned to normal sensation.  No speech changes.  Vision changes are also intermittent.  No current headache.  Past Medical History:  Diagnosis Date   [redacted] weeks gestation of pregnancy 02/02/2020   Anemia    Anxiety    Appendicitis 04/01/2017   Depression    GERD (gastroesophageal reflux disease)    Headache    Miscarriage within last 12 months    Ovarian cyst     Review of Systems  Constitutional: No fever/chills Eyes: Intermittent blurry vision.  Cardiovascular: Denies chest pain. Respiratory: Denies shortness of breath. Gastrointestinal: No abdominal pain.  No nausea, no vomiting.   Neurological: Positive intermittent HA with tingling/numbness to the left arm. Left leg numbness yesterday (resolved).    ____________________________________________   PHYSICAL EXAM:  VITAL SIGNS: ED Triage Vitals  Encounter Vitals Group     BP 09/19/23 1609 (!) 131/92     Pulse Rate 09/19/23 1609 (!) 111     Resp 09/19/23 1609 16     Temp 09/19/23 1609 98 F (36.7 C)     Temp src --      SpO2 09/19/23 1609 100 %     Weight 09/19/23 1602 294 lb 15.6 oz (133.8 kg)     Height 09/19/23 1602 5' 3 (1.6 m)   Constitutional: Alert and oriented. Well appearing and in no acute distress. Eyes: Conjunctivae  are normal. PERRL. EOMI. Head: Atraumatic. Nose: No congestion/rhinnorhea. Mouth/Throat: Mucous membranes are moist. Neck: No stridor.   Cardiovascular: Normal rate, regular rhythm. Good peripheral circulation. Grossly normal heart sounds.   Respiratory: Normal respiratory effort.  No retractions. Lungs CTAB. Gastrointestinal: Soft and nontender. No distention.  Musculoskeletal: No lower extremity tenderness nor edema. No gross deformities of extremities. Neurologic:  Normal speech and language. No gross focal neurologic deficits are appreciated.  5/5 strength in the bilateral upper and lower extremities.  Normal sensation to the face, bilateral upper extremity, bilateral lower extremities. Skin:  Skin is warm, dry and intact. No rash noted.  ____________________________________________   LABS (all labs ordered are listed, but only abnormal results are displayed)  Labs Reviewed  COMPREHENSIVE METABOLIC PANEL WITH GFR - Abnormal; Notable for the following components:      Result Value   Glucose, Bld 104 (*)    AST 14 (*)    All other components within normal limits  CBC - Abnormal; Notable for the following components:   WBC 14.1 (*)    Hemoglobin 11.0 (*)    HCT 33.9 (*)    All other components within normal limits   ____________________________________________  EKG   EKG Interpretation Date/Time:  Friday September 19 2023 16:08:15 EDT Ventricular Rate:  114 PR Interval:  153 QRS Duration:  93 QT Interval:  316 QTC Calculation:  436 R Axis:   61  Text Interpretation: Sinus tachycardia Low voltage, precordial leads Borderline T abnormalities, anterior leads Confirmed by Darra Chew [phone]) on 09/19/2023 4:40:20 PM       ____________________________________________   PROCEDURES  Procedure(s) performed:   Procedures  None  ____________________________________________   INITIAL IMPRESSION / ASSESSMENT AND PLAN / ED COURSE  Pertinent labs & imaging results that  were available during my care of the patient were reviewed by me and considered in my medical decision making (see chart for details).   This patient is Presenting for Evaluation of numbness, which does require a range of treatment options, and is a complaint that involves a high risk of morbidity and mortality.  The Differential Diagnoses include cervical radiculopathy, TIA, CVA, MS, etc.  Clinical Laboratory Tests Ordered, included CBC with leukocytosis to 14.1.  Mild anemia to 11.0.  No acute kidney injury.  Potassium normal.  Radiologic Tests Ordered, included CT head and c spine. I independently interpreted the images and agree with radiology interpretation.   Medical Decision Making: Summary:  Patient presents to the emergency department for evaluation of intermittent headache with tingling/numbness in the left upper extremity.  Description sounds slightly more concerning for cervical radiculopathy.  I do not appreciate any focal neurodeficits on my assessment.  She is well outside the timeframe for any code stroke activation with 4 days of symptoms.  She did have some left leg symptoms yesterday that have resolved.  I will start with CT imaging of the head and cervical spine.   Reevaluation with update and discussion with patient. Plan for transfer to the [facility] ED for MRI brain and c spine.   Patient's presentation is most consistent with acute presentation with potential threat to life or bodily function.   Disposition: transfer  ____________________________________________  FINAL CLINICAL IMPRESSION(S) / ED DIAGNOSES  Final diagnoses:  Nonintractable headache, unspecified chronicity pattern, unspecified headache type    Note:  This document was prepared using Dragon voice recognition software and may include unintentional dictation errors.  Chew Darra, MD, [facility] Emergency Medicine    Sintia Mckissic, Chew MATSU, MD 09/22/23 [phone]

## 2023-09-20 MED ORDER — DIPHENHYDRAMINE HCL 50 MG/ML IJ SOLN
12.5000 mg | Freq: Once | INTRAMUSCULAR | Status: AC
  Administered 2023-09-20: 12.5 mg via INTRAVENOUS
  Filled 2023-09-20: qty 1

## 2023-09-20 MED ORDER — METOCLOPRAMIDE HCL 5 MG/ML IJ SOLN
10.0000 mg | INTRAMUSCULAR | Status: AC
  Administered 2023-09-20: 10 mg via INTRAVENOUS
  Filled 2023-09-20: qty 2

## 2023-09-20 MED ORDER — KETOROLAC TROMETHAMINE 15 MG/ML IJ SOLN
15.0000 mg | Freq: Once | INTRAMUSCULAR | Status: AC
  Administered 2023-09-20: 15 mg via INTRAVENOUS
  Filled 2023-09-20: qty 1

## 2023-09-20 NOTE — Discharge Instructions (Addendum)
 Wear your glasses as recommended. You may benefit from repeat eye exam. Follow up with Neurology, as scheduled.

## 2023-09-20 NOTE — ED Provider Notes (Signed)
 1:09 AM Care assumed in transfer from [facility].   In short, 28 y/o female with c/o headache, blurry vision, some paresthesias to the LUE. States she is supposed to wear glasses all of the time, but typically only uses them when she is driving. No hx of head trauma. Transferred for MRI for further evaluation of headaches.  MRI of head and C-spine are negative for acute pathology. Partially empty sella noted; likely normal variant, but IIH not excluded. Currently no vision changes, denies numbness, no focal deficits noted. Do not feel that emergent LP is indicated. Will treat symptomatically and reassess. She has f/u with Neurology scheduled in November. Encouraged consistent use of corrective lenses.  3:15 AM Patient feeling better. Comfortable with discharge.   Keith Sor, PA-C 09/20/23 [id number]    Raford Lenis, MD 09/20/23 [phone]

## 2023-10-10 ENCOUNTER — Encounter (HOSPITAL_COMMUNITY): Payer: Self-pay | Admitting: Obstetrics and Gynecology

## 2023-10-10 NOTE — Progress Notes (Signed)
 Spoke w/ via phone for pre-op interview--- [facility] needs dos----   CBC, T&S and UPT per surgeon.       Lab results------Current EKG in Epic dated 09/19/23 COVID test -----patient states asymptomatic no test needed Arrive at -------0530 NPO after MN NO Solid Food.   Pre-Surgery Ensure or G2:  Med rec completed Medications to take morning of surgery ----- Bring Albuterol inhaler. Prozac. Diabetic medication -----  GLP1 agonist last dose: Georjean is on pt medication list but pt stated she has not had since the beginning of the month.  GLP1 instructions: Hold any further does of Wegovy until after procedure.  Patient instructed no nail polish to be worn day of surgery Patient instructed to bring photo id and insurance card day of surgery Patient aware to have Driver (ride ) / caregiver    for 24 hours after surgery - Partner Seena Domino Patient Special Instructions ----- Pre-Op special Instructions -----  Patient verbalized understanding of instructions that were given at this phone interview. Patient denies chest pain, sob, fever, cough at the interview.

## 2023-10-15 NOTE — H&P (Signed)
 28 y.o. [id number] desires permanent sterilization.  She was given the choice of filschie clips or salpingectomies and she desires salpingectomies via laparoscope.  Past Medical History:  Diagnosis Date   [redacted] weeks gestation of pregnancy 02/02/2020   Anemia    Anxiety    Appendicitis 04/01/2017   Depression    GERD (gastroesophageal reflux disease)    Headache    Miscarriage within last 12 months    Ovarian cyst    Pre-diabetes    Past Surgical History:  Procedure Laterality Date   APPENDECTOMY     CESAREAN SECTION N/A 02/04/2020   Procedure: CESAREAN SECTION;  Surgeon: Bonnielee Rayleen POUR, MD;  Location: [facility] LD ORS;  Service: Obstetrics;  Laterality: N/A;   CESAREAN SECTION N/A 08/14/2022   Procedure: CESAREAN SECTION;  Surgeon: Lilton Legions, DO;  Location: [facility] LD ORS;  Service: Obstetrics;  Laterality: N/A;   LAPAROSCOPIC APPENDECTOMY N/A 04/02/2017   Procedure: APPENDECTOMY LAPAROSCOPIC;  Surgeon: Eletha Boas, MD;  Location: [facility] ORS;  Service: General;  Laterality: N/A;    Social History   Socioeconomic History   Marital status: Single    Spouse name: Not on file   Number of children: Not on file   Years of education: Not on file   Highest education level: Not on file  Occupational History   Not on file  Tobacco Use   Smoking status: Never   Smokeless tobacco: Never  Vaping Use   Vaping status: Never Used  Substance and Sexual Activity   Alcohol use: Not Currently    Comment: occ   Drug use: Never   Sexual activity: Yes    Birth control/protection: None  Other Topics Concern   Not on file  Social History Narrative   ** Merged History Encounter **       Social Drivers of Health   Financial Resource Strain: Not on file  Food Insecurity: Low Risk  (09/23/2023)   Received from [facility]   Hunger Vital Sign    Within the past 12 months, you worried that your food would run out before you got money to buy more: Never true    Within the past 12 months, the  food you bought just didn't last and you didn't have money to get more. : Never true  Transportation Needs: No Transportation Needs (09/23/2023)   Received from [facility]    In the past 12 months, has lack of reliable transportation kept you from medical appointments, meetings, work or from getting things needed for daily living? : No  Physical Activity: Not on file  Stress: Not on file  Social Connections: Unknown (05/28/2021)   Received from [facility]   Social Network    Social Network: Not on file  Intimate Partner Violence: Unknown (04/19/2021)   Received from [facility]   HITS    Physically Hurt: Not on file    Insult or Talk Down To: Not on file    Threaten Physical Harm: Not on file    Scream or Curse: Not on file    No current facility-administered medications on file prior to encounter.   Current Outpatient Medications on File Prior to Encounter  Medication Sig Dispense Refill   amphetamine-dextroamphetamine (ADDERALL XR) 10 MG 24 hr capsule TAKE ONE CAPSULE BY MOUTH EVERY MORNING FOR 7 DAYS, THEN INCREASE TO TAKE TWO CAPSULES BY MOUTH EVERY MORNING AS NEEDED AND TOLERATED     FLUoxetine (PROZAC) 40 MG capsule Take 40 mg by mouth  daily.     albuterol (VENTOLIN HFA) 108 (90 Base) MCG/ACT inhaler Inhale 2 puffs into the lungs every 6 (six) hours as needed for wheezing or shortness of breath.     ondansetron  (ZOFRAN -ODT) 4 MG disintegrating tablet Take 1 tablet (4 mg total) by mouth every 8 (eight) hours as needed for nausea or vomiting. (Patient not taking: Reported on 10/10/2023) 20 tablet 0   oxyCODONE  (OXY IR/ROXICODONE ) 5 MG immediate release tablet Take 1-2 tablets (5-10 mg total) by mouth every 4 (four) hours as needed for moderate pain. (Patient not taking: Reported on 06/07/2023) 30 tablet 0   WEGOVY 0.5 MG/0.5ML SOAJ Inject 0.5 mg into the skin once a week. (Patient not taking: Reported on 10/10/2023)     [DISCONTINUED] misoprostol  (CYTOTEC ) 200 MCG  tablet Place four tablets in between your gums and cheeks (two tablets on each side) as instructed (Patient not taking: Reported on 09/25/2018) 4 tablet 1    Allergies  Allergen Reactions   Fish Allergy Anaphylaxis   Peanut-Containing Drug Products Anaphylaxis    Vitals:   10/10/23 1157  Weight: 127 kg    Lungs: clear to ascultation Cor:  RRR Abdomen:  soft, nontender, nondistended. Ex:  no cords, erythema Pelvic:   NEFG, normal vagina, uterus normal size and no adnexal masses.  A:  For laparoscopic bilateral salpingectomies for sterilization.   P: P: All risks, benefits and alternatives d/w patient and she desires to proceed.  Patient has undergone an ERAS protocol and will receive SCDs during the operation.     Miranda Mack

## 2023-10-17 ENCOUNTER — Ambulatory Visit (HOSPITAL_BASED_OUTPATIENT_CLINIC_OR_DEPARTMENT_OTHER): Admitting: Anesthesiology

## 2023-10-17 ENCOUNTER — Other Ambulatory Visit: Payer: Self-pay

## 2023-10-17 ENCOUNTER — Encounter (HOSPITAL_COMMUNITY): Payer: Self-pay | Admitting: Obstetrics and Gynecology

## 2023-10-17 ENCOUNTER — Ambulatory Visit (HOSPITAL_COMMUNITY)
Admission: RE | Admit: 2023-10-17 | Discharge: 2023-10-17 | Disposition: A | Discharge status: Home / Self Care | Attending: Obstetrics and Gynecology | Admitting: Obstetrics and Gynecology

## 2023-10-17 ENCOUNTER — Encounter (HOSPITAL_COMMUNITY)
Admission: RE | Disposition: A | Discharge status: Home / Self Care | Payer: Self-pay | Source: Home / Self Care | Attending: Obstetrics and Gynecology

## 2023-10-17 ENCOUNTER — Ambulatory Visit (HOSPITAL_COMMUNITY): Admitting: Anesthesiology

## 2023-10-17 DIAGNOSIS — Z302 Encounter for sterilization: Secondary | ICD-10-CM | POA: Insufficient documentation

## 2023-10-17 DIAGNOSIS — Z6841 Body Mass Index (BMI) 40.0 and over, adult: Secondary | ICD-10-CM | POA: Insufficient documentation

## 2023-10-17 DIAGNOSIS — F418 Other specified anxiety disorders: Secondary | ICD-10-CM

## 2023-10-17 DIAGNOSIS — F32A Depression, unspecified: Secondary | ICD-10-CM | POA: Diagnosis not present

## 2023-10-17 DIAGNOSIS — F419 Anxiety disorder, unspecified: Secondary | ICD-10-CM | POA: Insufficient documentation

## 2023-10-17 DIAGNOSIS — E66813 Obesity, class 3: Secondary | ICD-10-CM

## 2023-10-17 DIAGNOSIS — Z01818 Encounter for other preprocedural examination: Secondary | ICD-10-CM

## 2023-10-17 HISTORY — DX: Prediabetes: R73.03

## 2023-10-17 HISTORY — PX: LAPAROSCOPIC BILATERAL SALPINGECTOMY: SHX5889

## 2023-10-17 LAB — CBC
HCT: 37.5 % (ref 36.0–46.0)
Hemoglobin: 12.1 g/dL (ref 12.0–15.0)
MCH: 28.1 pg (ref 26.0–34.0)
MCHC: 32.3 g/dL (ref 30.0–36.0)
MCV: 87 fL (ref 80.0–100.0)
Platelets: 359 K/uL (ref 150–400)
RBC: 4.31 MIL/uL (ref 3.87–5.11)
RDW: 14.2 % (ref 11.5–15.5)
WBC: 10.1 K/uL (ref 4.0–10.5)
nRBC: 0 % (ref 0.0–0.2)

## 2023-10-17 LAB — TYPE AND SCREEN
ABO/RH(D): O POS
Antibody Screen: NEGATIVE

## 2023-10-17 LAB — POCT PREGNANCY, URINE: Preg Test, Ur: NEGATIVE

## 2023-10-17 SURGERY — SALPINGECTOMY, BILATERAL, LAPAROSCOPIC
Anesthesia: General | Site: Abdomen | Laterality: Bilateral

## 2023-10-17 MED ORDER — ONDANSETRON HCL 4 MG/2ML IJ SOLN
INTRAMUSCULAR | Status: DC | PRN
  Administered 2023-10-17: 4 mg via INTRAVENOUS

## 2023-10-17 MED ORDER — OXYCODONE HCL 5 MG PO TABS: 5.0000 mg | ORAL_TABLET | ORAL | 0 refills | Status: AC | PRN

## 2023-10-17 MED ORDER — DEXAMETHASONE SODIUM PHOSPHATE 10 MG/ML IJ SOLN
INTRAMUSCULAR | Status: DC | PRN
Start: 2023-10-17 — End: 2023-10-17
  Administered 2023-10-17: 10 mg via INTRAVENOUS

## 2023-10-17 MED ORDER — AMISULPRIDE (ANTIEMETIC) 5 MG/2ML IV SOLN: 10.0000 mg | Freq: Once | INTRAVENOUS | Status: DC | PRN

## 2023-10-17 MED ORDER — CHLORHEXIDINE GLUCONATE 0.12 % MT SOLN
15.0000 mL | Freq: Once | OROMUCOSAL | Status: AC
  Administered 2023-10-17: 15 mL via OROMUCOSAL

## 2023-10-17 MED ORDER — KETAMINE HCL 50 MG/5ML IJ SOSY
PREFILLED_SYRINGE | INTRAMUSCULAR | Status: AC
  Filled 2023-10-17: qty 5

## 2023-10-17 MED ORDER — BUPIVACAINE HCL (PF) 0.25 % IJ SOLN
INTRAMUSCULAR | Status: DC | PRN
  Administered 2023-10-17: 10 mL

## 2023-10-17 MED ORDER — POVIDONE-IODINE 10 % EX SWAB: 2.0000 | Freq: Once | CUTANEOUS | Status: DC

## 2023-10-17 MED ORDER — PROPOFOL 1000 MG/100ML IV EMUL
INTRAVENOUS | Status: AC
  Filled 2023-10-17: qty 100

## 2023-10-17 MED ORDER — ROCURONIUM BROMIDE 10 MG/ML (PF) SYRINGE
PREFILLED_SYRINGE | INTRAVENOUS | Status: AC
  Filled 2023-10-17: qty 10

## 2023-10-17 MED ORDER — 0.9 % SODIUM CHLORIDE (POUR BTL) OPTIME
TOPICAL | Status: DC | PRN
  Administered 2023-10-17: 1000 mL

## 2023-10-17 MED ORDER — ORAL CARE MOUTH RINSE: 15.0000 mL | Freq: Once | OROMUCOSAL | Status: AC

## 2023-10-17 MED ORDER — BUPIVACAINE HCL (PF) 0.25 % IJ SOLN
INTRAMUSCULAR | Status: AC
  Filled 2023-10-17: qty 30

## 2023-10-17 MED ORDER — CHLORHEXIDINE GLUCONATE 0.12 % MT SOLN
OROMUCOSAL | Status: AC
  Filled 2023-10-17: qty 15

## 2023-10-17 MED ORDER — SODIUM CHLORIDE (PF) 0.9 % IJ SOLN
INTRAMUSCULAR | Status: AC
  Filled 2023-10-17: qty 50

## 2023-10-17 MED ORDER — PROPOFOL 10 MG/ML IV BOLUS
INTRAVENOUS | Status: DC | PRN
  Administered 2023-10-17: 150 mg via INTRAVENOUS
  Administered 2023-10-17: 50 mg via INTRAVENOUS

## 2023-10-17 MED ORDER — MIDAZOLAM HCL 2 MG/2ML IJ SOLN
INTRAMUSCULAR | Status: AC
  Filled 2023-10-17: qty 2

## 2023-10-17 MED ORDER — LACTATED RINGERS IV SOLN: INTRAVENOUS | Status: DC

## 2023-10-17 MED ORDER — ONDANSETRON HCL 4 MG/2ML IJ SOLN
INTRAMUSCULAR | Status: AC
  Filled 2023-10-17: qty 2

## 2023-10-17 MED ORDER — SODIUM CHLORIDE 0.9 % IV SOLN: 12.5000 mg | INTRAVENOUS | Status: DC | PRN

## 2023-10-17 MED ORDER — HYDROMORPHONE HCL 1 MG/ML IJ SOLN: 0.2500 mg | INTRAMUSCULAR | Status: DC | PRN

## 2023-10-17 MED ORDER — MIDAZOLAM HCL 2 MG/2ML IJ SOLN
INTRAMUSCULAR | Status: DC | PRN
  Administered 2023-10-17: 2 mg via INTRAVENOUS

## 2023-10-17 MED ORDER — SUGAMMADEX SODIUM 200 MG/2ML IV SOLN
INTRAVENOUS | Status: DC | PRN
  Administered 2023-10-17: 400 mg via INTRAVENOUS

## 2023-10-17 MED ORDER — ROPIVACAINE HCL 5 MG/ML IJ SOLN
INTRAMUSCULAR | Status: AC
  Filled 2023-10-17: qty 30

## 2023-10-17 MED ORDER — OXYCODONE HCL 5 MG PO TABS: 5.0000 mg | ORAL_TABLET | Freq: Once | ORAL | Status: DC | PRN

## 2023-10-17 MED ORDER — KETOROLAC TROMETHAMINE 30 MG/ML IJ SOLN
INTRAMUSCULAR | Status: DC | PRN
Start: 2023-10-17 — End: 2023-10-17
  Administered 2023-10-17: 30 mg via INTRAVENOUS

## 2023-10-17 MED ORDER — OXYCODONE HCL 5 MG/5ML PO SOLN: 5.0000 mg | Freq: Once | ORAL | Status: DC | PRN

## 2023-10-17 MED ORDER — MEPERIDINE HCL 25 MG/ML IJ SOLN: 6.2500 mg | INTRAMUSCULAR | Status: DC | PRN

## 2023-10-17 MED ORDER — DEXAMETHASONE SODIUM PHOSPHATE 10 MG/ML IJ SOLN
INTRAMUSCULAR | Status: AC
  Filled 2023-10-17: qty 1

## 2023-10-17 MED ORDER — LIDOCAINE 2% (20 MG/ML) 5 ML SYRINGE
INTRAMUSCULAR | Status: AC
  Filled 2023-10-17: qty 5

## 2023-10-17 MED ORDER — SOD CITRATE-CITRIC ACID 500-334 MG/5ML PO SOLN: 30.0000 mL | ORAL | Status: DC

## 2023-10-17 MED ORDER — PROPOFOL 10 MG/ML IV BOLUS
INTRAVENOUS | Status: AC
  Filled 2023-10-17: qty 20

## 2023-10-17 MED ORDER — LIDOCAINE 2% (20 MG/ML) 5 ML SYRINGE
INTRAMUSCULAR | Status: DC | PRN
  Administered 2023-10-17: 100 mg via INTRAVENOUS

## 2023-10-17 MED ORDER — LACTATED RINGERS IV SOLN
INTRAVENOUS | Status: DC
Start: 2023-10-17 — End: 2023-10-17

## 2023-10-17 MED ORDER — FENTANYL CITRATE (PF) 250 MCG/5ML IJ SOLN
INTRAMUSCULAR | Status: DC | PRN
  Administered 2023-10-17 (×2): 50 ug via INTRAVENOUS
  Administered 2023-10-17: 100 ug via INTRAVENOUS

## 2023-10-17 MED ORDER — FENTANYL CITRATE (PF) 250 MCG/5ML IJ SOLN
INTRAMUSCULAR | Status: AC
  Filled 2023-10-17: qty 5

## 2023-10-17 MED ORDER — ROCURONIUM BROMIDE 10 MG/ML (PF) SYRINGE
PREFILLED_SYRINGE | INTRAVENOUS | Status: DC | PRN
Start: 2023-10-17 — End: 2023-10-17
  Administered 2023-10-17: 20 mg via INTRAVENOUS
  Administered 2023-10-17: 50 mg via INTRAVENOUS

## 2023-10-17 MED ORDER — SODIUM CHLORIDE 0.9 % IV SOLN
INTRAVENOUS | Status: DC | PRN
  Administered 2023-10-17: 60 mL

## 2023-10-17 SURGICAL SUPPLY — 27 items
CATH ROBINSON RED A/P 16FR (CATHETERS) ×1 IMPLANT
DERMABOND ADVANCED .7 DNX12 (GAUZE/BANDAGES/DRESSINGS) ×1 IMPLANT
DRAPE SURG IRRIG POUCH 19X23 (DRAPES) ×1 IMPLANT
DURAPREP 26ML APPLICATOR (WOUND CARE) ×1 IMPLANT
GLOVE BIO SURGEON STRL SZ7 (GLOVE) ×1 IMPLANT
GLOVE BIOGEL PI IND STRL 7.0 (GLOVE) IMPLANT
GOWN STRL REUS W/ TWL XL LVL3 (GOWN DISPOSABLE) ×1 IMPLANT
IRRIGATION SUCT STRKRFLW 2 WTP (MISCELLANEOUS) IMPLANT
KIT PINK PAD W/HEAD ARM REST (MISCELLANEOUS) ×1 IMPLANT
KIT TURNOVER KIT B (KITS) ×1 IMPLANT
LIGASURE LAP MARYLAND 5MM 37CM (ELECTROSURGICAL) IMPLANT
LIGASURE VESSEL 5MM BLUNT TIP (ELECTROSURGICAL) IMPLANT
NDL INSUFFLATION 14GA 120MM (NEEDLE) ×1 IMPLANT
NEEDLE INSUFFLATION 14GA 120MM (NEEDLE) ×1 IMPLANT
NS IRRIG 1000ML POUR BTL (IV SOLUTION) IMPLANT
PACK LAPAROSCOPY BASIN (CUSTOM PROCEDURE TRAY) ×1 IMPLANT
SCISSORS LAP 5X35 DISP (ENDOMECHANICALS) IMPLANT
SET TUBE SMOKE EVAC HIGH FLOW (TUBING) ×1 IMPLANT
SLEEVE Z-THREAD 5X100MM (TROCAR) ×2 IMPLANT
SOLN 0.9% NACL 1000 ML (IV SOLUTION) ×1 IMPLANT
SOLN 0.9% NACL POUR BTL 1000ML (IV SOLUTION) ×1 IMPLANT
SOLUTION ELECTROSURG ANTI STCK (MISCELLANEOUS) IMPLANT
SUT VICRYL RAPIDE 3 0 (SUTURE) ×1 IMPLANT
SYR 50ML LL SCALE MARK (SYRINGE) ×1 IMPLANT
TOWEL GREEN STERILE FF (TOWEL DISPOSABLE) ×1 IMPLANT
TROCAR Z-THREAD FIOS 5X100MM (TROCAR) ×1 IMPLANT
WARMER LAPAROSCOPE (MISCELLANEOUS) ×1 IMPLANT

## 2023-10-17 NOTE — H&P (View-Only) (Signed)
 There has been no change in the patients history, status or exam since the history and physical.  Vitals:   10/10/23 1157 10/17/23 0613  BP:  134/78  Pulse:  88  Resp:  18  Temp:  98 F (36.7 C)  TempSrc:  Oral  SpO2:  97%  Weight: 127 kg 127 kg  Height:  5' 3 (1.6 m)    Results for orders placed or performed during the hospital encounter of 10/17/23 (from the past 72 hours)  Pregnancy, urine POC     Status: None   Collection Time: 10/17/23  5:55 AM  Result Value Ref Range   Preg Test, Ur NEGATIVE NEGATIVE    Comment:        THE SENSITIVITY OF THIS METHODOLOGY IS >20 mIU/mL.   CBC     Status: None   Collection Time: 10/17/23  6:34 AM  Result Value Ref Range   WBC 10.1 4.0 - 10.5 K/uL   RBC 4.31 3.87 - 5.11 MIL/uL   Hemoglobin 12.1 12.0 - 15.0 g/dL   HCT 62.4 63.9 - 53.9 %   MCV 87.0 80.0 - 100.0 fL   MCH 28.1 26.0 - 34.0 pg   MCHC 32.3 30.0 - 36.0 g/dL   RDW 85.7 88.4 - 84.4 %   Platelets 359 150 - 400 K/uL   nRBC 0.0 0.0 - 0.2 %    Comment: Performed at [facility], [address], [place], [place] [address]  Type and screen     Status: None (Preliminary result)   Collection Time: 10/17/23  6:34 AM  Result Value Ref Range   ABO/RH(D) PENDING    Antibody Screen PENDING    Sample Expiration      10/20/2023,2359 Performed at [facility], [address] [place], [place] [address]     Miranda Mack

## 2023-10-17 NOTE — Progress Notes (Signed)
 There has been no change in the patients history, status or exam since the history and physical.  Vitals:   10/10/23 1157 10/17/23 0613  BP:  134/78  Pulse:  88  Resp:  18  Temp:  98 F (36.7 C)  TempSrc:  Oral  SpO2:  97%  Weight: 127 kg 127 kg  Height:  5' 3 (1.6 m)    Results for orders placed or performed during the hospital encounter of 10/17/23 (from the past 72 hours)  Pregnancy, urine POC     Status: None   Collection Time: 10/17/23  5:55 AM  Result Value Ref Range   Preg Test, Ur NEGATIVE NEGATIVE    Comment:        THE SENSITIVITY OF THIS METHODOLOGY IS >20 mIU/mL.   CBC     Status: None   Collection Time: 10/17/23  6:34 AM  Result Value Ref Range   WBC 10.1 4.0 - 10.5 K/uL   RBC 4.31 3.87 - 5.11 MIL/uL   Hemoglobin 12.1 12.0 - 15.0 g/dL   HCT 62.4 63.9 - 53.9 %   MCV 87.0 80.0 - 100.0 fL   MCH 28.1 26.0 - 34.0 pg   MCHC 32.3 30.0 - 36.0 g/dL   RDW 85.7 88.4 - 84.4 %   Platelets 359 150 - 400 K/uL   nRBC 0.0 0.0 - 0.2 %    Comment: Performed at [facility], [address], [place], [place] [address]  Type and screen     Status: None (Preliminary result)   Collection Time: 10/17/23  6:34 AM  Result Value Ref Range   ABO/RH(D) PENDING    Antibody Screen PENDING    Sample Expiration      10/20/2023,2359 Performed at [facility], [address] [place], [place] [address]     Miranda Mack

## 2023-10-17 NOTE — Anesthesia Procedure Notes (Signed)
 Procedure Name: Intubation Date/Time: 10/17/2023 7:52 AM  Performed by: Jolynn Mage, CRNAPre-anesthesia Checklist: Patient identified, Patient being monitored, Timeout performed, Emergency Drugs available and Suction available Patient Re-evaluated:Patient Re-evaluated prior to induction Oxygen Delivery Method: Circle System Utilized Preoxygenation: Pre-oxygenation with 100% oxygen Induction Type: IV induction Ventilation: Mask ventilation without difficulty, Oral airway inserted - appropriate to patient size and Two handed mask ventilation required Laryngoscope Size: Miller and 2 Grade View: Grade II Tube type: Oral Tube size: 7.0 mm Number of attempts: 1 Airway Equipment and Method: Stylet Placement Confirmation: ETT inserted through vocal cords under direct vision, positive ETCO2 and breath sounds checked- equal and bilateral Secured at: 21 cm Tube secured with: Tape Dental Injury: Teeth and Oropharynx as per pre-operative assessment

## 2023-10-17 NOTE — Discharge Instructions (Signed)
     No ibuprofen , Advil , Aleve , Motrin , ketorolac , meloxicam , naproxen , or other NSAIDS until after 2:42 pm today if needed.     Post Anesthesia Home Care Instructions  Activity: Get plenty of rest for the remainder of the day. A responsible individual must stay with you for 24 hours following the procedure.  For the next 24 hours, DO NOT: -Drive a car -Advertising copywriter -Drink alcoholic beverages -Take any medication unless instructed by your physician -Make any legal decisions or sign important papers.  Meals: Start with liquid foods such as gelatin or soup. Progress to regular foods as tolerated. Avoid greasy, spicy, heavy foods. If nausea and/or vomiting occur, drink only clear liquids until the nausea and/or vomiting subsides. Call your physician if vomiting continues.  Special Instructions/Symptoms: Your throat may feel dry or sore from the anesthesia or the breathing tube placed in your throat during surgery. If this causes discomfort, gargle with warm salt water. The discomfort should disappear within 24 hours.

## 2023-10-17 NOTE — Anesthesia Postprocedure Evaluation (Signed)
 Anesthesia Post Note  Patient: Miranda Mack  Procedure(s) Performed: SALPINGECTOMY, BILATERAL, LAPAROSCOPIC (Bilateral: Abdomen)     Patient location during evaluation: PACU Anesthesia Type: General Level of consciousness: awake and alert Pain management: pain level controlled Vital Signs Assessment: post-procedure vital signs reviewed and stable Respiratory status: spontaneous breathing, nonlabored ventilation and respiratory function stable Cardiovascular status: blood pressure returned to baseline and stable Postop Assessment: no apparent nausea or vomiting Anesthetic complications: no   No notable events documented.  Last Vitals:  Vitals:   10/17/23 0930 10/17/23 0945  BP: (!) 109/56 106/62  Pulse: 73 82  Resp: 17 18  Temp:    SpO2: 97% 97%    Last Pain:  Vitals:   10/17/23 0945  TempSrc:   PainSc: 0-No pain                 Butler Levander Pinal

## 2023-10-17 NOTE — Op Note (Signed)
 10/17/2023  8:49 AM  PATIENT:  Miranda Mack  28 y.o. female  PRE-OPERATIVE DIAGNOSIS:  sterilization  POST-OPERATIVE DIAGNOSIS:  sterilization  PROCEDURE:  Procedure(s): SALPINGECTOMY, BILATERAL, LAPAROSCOPIC (Bilateral)  SURGEON:  Surgeons and Role:    * Sarrah Browning, MD - Primary    * Claire Rubie LABOR, MD - Assisting  ANESTHESIA:   general  EBL:  50cc  BLOOD ADMINISTERED:none  DRAINS: none   LOCAL MEDICATIONS USED:  MARCAINE    and ropivicaine  SPECIMEN:  Source of Specimen:  bilateral tubes  DISPOSITION OF SPECIMEN:  PATHOLOGY  COUNTS:  YES  TOURNIQUET:  * No tourniquets in log *  DICTATION: .Note written in EPIC  PLAN OF CARE: Discharge to home after PACU  PATIENT DISPOSITION:  PACU - hemodynamically stable.   Delay start of Pharmacological VTE agent (>24hrs) due to surgical blood loss or risk of bleeding: not applicable  Complications: none  Findings:  Normal pelvis.  Normal ovaries, peritoneum under ovaries, uterus, tubes, cul de sac, anterior bladder or anterior abdominal wall.  The liver edge, diaphragm and appendix were all normal.  The tubes were normal caliber and had normal fimbriae.  There were dense adhesions of the omentum to anterior peritoneum just below the umbilicus.  There were dense adhesions of the uterus and L round ligament likely from her cesarean section.  Technique: After adequate general anesthesia was achieved, the patient was prepped and draped in the usual fashion.  A uterine manipulator was placed in the cervix and secured.  The bladder was drained with a red rubber.  A 0.5 cm incision was made above the umbilicus and the veress needle passed inside without aspiration of bowel contents or blood.  The 5 mm trocar was placed without comp after the abdomen was insufflated.  The camera was placed inside and two 5 mm trocar placed 3 above each iliac crest under direct visualization of the camera.  A careful and systematic exploration  was performed and the above findings were noted.   The dense adhesions of the omentum were clear of bowel and in the way so they were taken down with the ligasure.  The ends of the omental cuts were cauterized again to help with hemostasis. Each tube was tented up with million dollar forceps and then cauterized and incised with the ligasure in the mesosalpinx.  Each tube was cauterized and incised with ligasure near the cornua and then removed through the trocar intact and sent to path.  Hemostasis was achieved.  The 5 mm trocars were removed and the skin incisions closed with subq 3-0vicryl R.  The incisions were closed with dermabond.  The patient tolerated the procedure well and was returned to the recovery room in stable condition.    Dequandre Cordova A

## 2023-10-17 NOTE — Transfer of Care (Signed)
 Immediate Anesthesia Transfer of Care Note  Patient: Miranda Mack  Procedure(s) Performed: SALPINGECTOMY, BILATERAL, LAPAROSCOPIC (Bilateral: Abdomen)  Patient Location: PACU  Anesthesia Type:General  Level of Consciousness: awake, alert , and oriented  Airway & Oxygen Therapy: Patient Spontanous Breathing  Post-op Assessment: Report given to RN, Post -op Vital signs reviewed and stable, Patient moving all extremities X 4, and Patient able to stick tongue midline  Post vital signs: Reviewed and stable  Last Vitals:  Vitals Value Taken Time  BP 118/63 10/17/23 09:04  Temp 36.6 C 10/17/23 09:04  Pulse 79 10/17/23 09:08  Resp 20 10/17/23 09:08  SpO2 97 % 10/17/23 09:08  Vitals shown include unfiled device data.  Last Pain:  Vitals:   10/17/23 0613  TempSrc: Oral  PainSc: 0-No pain      Patients Stated Pain Goal: 5 (10/17/23 9386)  Complications: No notable events documented.

## 2023-10-17 NOTE — Brief Op Note (Signed)
 10/17/2023  8:49 AM  PATIENT:  Miranda Mack  28 y.o. female  PRE-OPERATIVE DIAGNOSIS:  sterilization  POST-OPERATIVE DIAGNOSIS:  sterilization  PROCEDURE:  Procedure(s): SALPINGECTOMY, BILATERAL, LAPAROSCOPIC (Bilateral)  SURGEON:  Surgeons and Role:    * Sarrah Browning, MD - Primary    * Claire Rubie LABOR, MD - Assisting  ANESTHESIA:   general  EBL:  50cc  BLOOD ADMINISTERED:none  DRAINS: none   LOCAL MEDICATIONS USED:  MARCAINE    and ropivicaine  SPECIMEN:  Source of Specimen:  bilateral tubes  DISPOSITION OF SPECIMEN:  PATHOLOGY  COUNTS:  YES  TOURNIQUET:  * No tourniquets in log *  DICTATION: .Note written in EPIC  PLAN OF CARE: Discharge to home after PACU  PATIENT DISPOSITION:  PACU - hemodynamically stable.   Delay start of Pharmacological VTE agent (>24hrs) due to surgical blood loss or risk of bleeding: not applicable

## 2023-10-17 NOTE — Anesthesia Preprocedure Evaluation (Addendum)
 Anesthesia Evaluation  Patient identified by MRN, date of birth, ID band Patient awake    Reviewed: Allergy & Precautions, H&P , NPO status , Patient's Chart, lab work & pertinent test results, reviewed documented beta blocker date and time   Airway Mallampati: II  TM Distance: >3 FB Neck ROM: full    Dental no notable dental hx.    Pulmonary neg pulmonary ROS   Pulmonary exam normal breath sounds clear to auscultation       Cardiovascular negative cardio ROS  Rhythm:regular Rate:Normal     Neuro/Psych  Headaches  Anxiety Depression     negative psych ROS   GI/Hepatic Neg liver ROS,GERD  ,,  Endo/Other  negative endocrine ROS  Class 3 obesity  Renal/GU negative Renal ROS  negative genitourinary   Musculoskeletal negative musculoskeletal ROS (+)    Abdominal  (+) + obese  Peds negative pediatric ROS (+)  Hematology negative hematology ROS (+)   Anesthesia Other Findings   Reproductive/Obstetrics negative OB ROS                              Anesthesia Physical Anesthesia Plan  ASA: 3  Anesthesia Plan: General   Post-op Pain Management:    Induction: Intravenous  PONV Risk Score and Plan: 3 and Dexamethasone , Ondansetron , Treatment may vary due to age or medical condition and Midazolam   Airway Management Planned: Oral ETT  Additional Equipment:   Intra-op Plan:   Post-operative Plan: Extubation in OR  Informed Consent: I have reviewed the patients History and Physical, chart, labs and discussed the procedure including the risks, benefits and alternatives for the proposed anesthesia with the patient or authorized representative who has indicated his/her understanding and acceptance.     Dental Advisory Given  Plan Discussed with: CRNA and Surgeon  Anesthesia Plan Comments: (  )         Anesthesia Quick Evaluation

## 2023-10-17 NOTE — Interval H&P Note (Signed)
 History and Physical Interval Note:  10/17/2023 7:07 AM  Miranda Mack  has presented today for surgery, with the diagnosis of sterilization.  The various methods of treatment have been discussed with the patient and family. After consideration of risks, benefits and other options for treatment, the patient has consented to  Procedure(s): SALPINGECTOMY, BILATERAL, LAPAROSCOPIC (Bilateral) as a surgical intervention.  The patient's history has been reviewed, patient examined, no change in status, stable for surgery.  I have reviewed the patient's chart and labs.  Questions were answered to the patient's satisfaction.     [name]

## 2023-10-18 ENCOUNTER — Encounter (HOSPITAL_COMMUNITY): Payer: Self-pay | Admitting: Obstetrics and Gynecology

## 2023-10-20 LAB — SURGICAL PATHOLOGY

## 2024-02-02 ENCOUNTER — Ambulatory Visit (INDEPENDENT_AMBULATORY_CARE_PROVIDER_SITE_OTHER): Admitting: Podiatry

## 2024-02-02 DIAGNOSIS — Z91199 Patient's noncompliance with other medical treatment and regimen due to unspecified reason: Secondary | ICD-10-CM

## 2024-02-04 NOTE — Progress Notes (Signed)
Patient did not show for scheduled appointment today.
